# Patient Record
Sex: Female | Born: 1965 | Race: Black or African American | Hispanic: No | Marital: Single | Smoking: Never smoker
Health system: Southern US, Community
[De-identification: ages and names within clinical notes are randomized; demographics above are authoritative.]

## PROBLEM LIST (undated history)

## (undated) DIAGNOSIS — M1990 Unspecified osteoarthritis, unspecified site: Secondary | ICD-10-CM

## (undated) DIAGNOSIS — E039 Hypothyroidism, unspecified: Secondary | ICD-10-CM

## (undated) DIAGNOSIS — R011 Cardiac murmur, unspecified: Secondary | ICD-10-CM

## (undated) DIAGNOSIS — I340 Nonrheumatic mitral (valve) insufficiency: Secondary | ICD-10-CM

## (undated) DIAGNOSIS — I10 Essential (primary) hypertension: Secondary | ICD-10-CM

## (undated) DIAGNOSIS — I4891 Unspecified atrial fibrillation: Secondary | ICD-10-CM

## (undated) DIAGNOSIS — D259 Leiomyoma of uterus, unspecified: Secondary | ICD-10-CM

## (undated) HISTORY — DX: Cardiac murmur, unspecified: R01.1

## (undated) HISTORY — DX: Nonrheumatic mitral (valve) insufficiency: I34.0

## (undated) HISTORY — PX: BREAST BIOPSY: SHX20

## (undated) HISTORY — PX: TUBAL LIGATION: SHX77

---

## 1999-04-26 ENCOUNTER — Other Ambulatory Visit: Admission: RE | Admit: 1999-04-26 | Discharge: 1999-04-26 | Payer: Self-pay | Admitting: Obstetrics

## 2000-09-12 ENCOUNTER — Emergency Department (HOSPITAL_COMMUNITY): Admission: EM | Admit: 2000-09-12 | Discharge: 2000-09-12 | Payer: Self-pay | Admitting: Emergency Medicine

## 2001-05-31 ENCOUNTER — Emergency Department (HOSPITAL_COMMUNITY): Admission: EM | Admit: 2001-05-31 | Discharge: 2001-05-31 | Payer: Self-pay | Admitting: *Deleted

## 2002-01-24 HISTORY — PX: WISDOM TOOTH EXTRACTION: SHX21

## 2002-12-15 ENCOUNTER — Emergency Department (HOSPITAL_COMMUNITY): Admission: EM | Admit: 2002-12-15 | Discharge: 2002-12-15 | Payer: Self-pay | Admitting: Emergency Medicine

## 2002-12-20 ENCOUNTER — Emergency Department (HOSPITAL_COMMUNITY): Admission: EM | Admit: 2002-12-20 | Discharge: 2002-12-21 | Payer: Self-pay | Admitting: Emergency Medicine

## 2003-08-03 ENCOUNTER — Emergency Department (HOSPITAL_COMMUNITY): Admission: EM | Admit: 2003-08-03 | Discharge: 2003-08-03 | Payer: Self-pay | Admitting: Emergency Medicine

## 2003-08-10 ENCOUNTER — Emergency Department (HOSPITAL_COMMUNITY): Admission: EM | Admit: 2003-08-10 | Discharge: 2003-08-10 | Payer: Self-pay

## 2003-08-17 ENCOUNTER — Emergency Department (HOSPITAL_COMMUNITY): Admission: EM | Admit: 2003-08-17 | Discharge: 2003-08-17 | Payer: Self-pay | Admitting: Family Medicine

## 2004-08-11 ENCOUNTER — Emergency Department (HOSPITAL_COMMUNITY): Admission: EM | Admit: 2004-08-11 | Discharge: 2004-08-11 | Payer: Self-pay | Admitting: Emergency Medicine

## 2006-07-21 ENCOUNTER — Emergency Department: Payer: Self-pay | Admitting: Emergency Medicine

## 2006-10-28 ENCOUNTER — Emergency Department (HOSPITAL_COMMUNITY): Admission: EM | Admit: 2006-10-28 | Discharge: 2006-10-28 | Payer: Self-pay | Admitting: Emergency Medicine

## 2006-12-26 ENCOUNTER — Ambulatory Visit (HOSPITAL_COMMUNITY): Admission: RE | Admit: 2006-12-26 | Discharge: 2006-12-26 | Payer: Self-pay | Admitting: Obstetrics

## 2007-02-24 ENCOUNTER — Emergency Department (HOSPITAL_COMMUNITY): Admission: EM | Admit: 2007-02-24 | Discharge: 2007-02-24 | Payer: Self-pay | Admitting: Emergency Medicine

## 2007-03-16 ENCOUNTER — Emergency Department (HOSPITAL_COMMUNITY): Admission: EM | Admit: 2007-03-16 | Discharge: 2007-03-16 | Payer: Self-pay | Admitting: Emergency Medicine

## 2007-05-08 ENCOUNTER — Emergency Department (HOSPITAL_COMMUNITY): Admission: EM | Admit: 2007-05-08 | Discharge: 2007-05-08 | Payer: Self-pay | Admitting: Emergency Medicine

## 2008-06-11 ENCOUNTER — Emergency Department (HOSPITAL_COMMUNITY): Admission: EM | Admit: 2008-06-11 | Discharge: 2008-06-11 | Payer: Self-pay | Admitting: Emergency Medicine

## 2009-04-14 ENCOUNTER — Emergency Department (HOSPITAL_COMMUNITY): Admission: EM | Admit: 2009-04-14 | Discharge: 2009-04-14 | Payer: Self-pay | Admitting: Emergency Medicine

## 2010-10-19 LAB — RAPID STREP SCREEN (MED CTR MEBANE ONLY): Streptococcus, Group A Screen (Direct): NEGATIVE

## 2010-11-04 LAB — CBC
HCT: 32.6 — ABNORMAL LOW
Hemoglobin: 10.8 — ABNORMAL LOW
MCHC: 33.1
MCV: 81.6
Platelets: 259
RBC: 4
RDW: 14.5 — ABNORMAL HIGH
WBC: 7.3

## 2010-11-04 LAB — DIFFERENTIAL
Basophils Absolute: 0
Basophils Relative: 0
Eosinophils Absolute: 0.2
Eosinophils Relative: 2
Lymphocytes Relative: 34
Lymphs Abs: 2.5
Monocytes Absolute: 0.7
Monocytes Relative: 10
Neutro Abs: 3.9
Neutrophils Relative %: 54

## 2010-11-04 LAB — PREGNANCY, URINE: Preg Test, Ur: NEGATIVE

## 2010-11-04 LAB — URINALYSIS, ROUTINE W REFLEX MICROSCOPIC
Bilirubin Urine: NEGATIVE
Glucose, UA: NEGATIVE
Ketones, ur: NEGATIVE
Leukocytes, UA: NEGATIVE
Nitrite: NEGATIVE
Protein, ur: NEGATIVE
Specific Gravity, Urine: 1.016
Urobilinogen, UA: 0.2
pH: 7

## 2010-11-04 LAB — BASIC METABOLIC PANEL
BUN: 11
CO2: 27
Calcium: 8.8
Chloride: 109
Creatinine, Ser: 0.82
GFR calc Af Amer: >60
GFR calc non Af Amer: >60
Glucose, Bld: 104 — ABNORMAL HIGH
Potassium: 3.7
Sodium: 140

## 2010-11-04 LAB — URINE MICROSCOPIC-ADD ON

## 2012-09-25 ENCOUNTER — Other Ambulatory Visit: Payer: Self-pay | Admitting: Obstetrics

## 2012-09-25 ENCOUNTER — Other Ambulatory Visit: Payer: Self-pay | Admitting: Emergency Medicine

## 2012-09-25 DIAGNOSIS — Z1231 Encounter for screening mammogram for malignant neoplasm of breast: Secondary | ICD-10-CM

## 2012-10-11 ENCOUNTER — Ambulatory Visit (HOSPITAL_COMMUNITY)
Admission: RE | Admit: 2012-10-11 | Discharge: 2012-10-11 | Disposition: A | Discharge status: Home / Self Care | Payer: No Typology Code available for payment source | Source: Ambulatory Visit | Attending: Emergency Medicine | Admitting: Emergency Medicine

## 2012-10-11 DIAGNOSIS — Z1231 Encounter for screening mammogram for malignant neoplasm of breast: Secondary | ICD-10-CM | POA: Insufficient documentation

## 2014-11-12 ENCOUNTER — Other Ambulatory Visit: Payer: Self-pay

## 2014-11-12 DIAGNOSIS — Z1231 Encounter for screening mammogram for malignant neoplasm of breast: Secondary | ICD-10-CM

## 2014-11-24 ENCOUNTER — Ambulatory Visit
Admission: RE | Admit: 2014-11-24 | Discharge: 2014-11-24 | Disposition: A | Discharge status: Home / Self Care | Payer: 59 | Source: Ambulatory Visit

## 2014-11-24 DIAGNOSIS — Z1231 Encounter for screening mammogram for malignant neoplasm of breast: Secondary | ICD-10-CM

## 2014-11-25 ENCOUNTER — Ambulatory Visit: Payer: No Typology Code available for payment source

## 2015-11-26 ENCOUNTER — Emergency Department (HOSPITAL_COMMUNITY)
Admission: EM | Admit: 2015-11-26 | Discharge: 2015-11-26 | Disposition: A | Discharge status: Home / Self Care | Payer: Worker's Compensation | Attending: Emergency Medicine | Admitting: Emergency Medicine

## 2015-11-26 ENCOUNTER — Encounter (HOSPITAL_COMMUNITY): Payer: Self-pay | Admitting: Emergency Medicine

## 2015-11-26 DIAGNOSIS — Z7982 Long term (current) use of aspirin: Secondary | ICD-10-CM | POA: Insufficient documentation

## 2015-11-26 DIAGNOSIS — S0990XA Unspecified injury of head, initial encounter: Secondary | ICD-10-CM | POA: Diagnosis not present

## 2015-11-26 DIAGNOSIS — W2203XA Walked into furniture, initial encounter: Secondary | ICD-10-CM | POA: Diagnosis not present

## 2015-11-26 DIAGNOSIS — Y999 Unspecified external cause status: Secondary | ICD-10-CM | POA: Insufficient documentation

## 2015-11-26 DIAGNOSIS — I10 Essential (primary) hypertension: Secondary | ICD-10-CM | POA: Insufficient documentation

## 2015-11-26 DIAGNOSIS — Y929 Unspecified place or not applicable: Secondary | ICD-10-CM | POA: Insufficient documentation

## 2015-11-26 DIAGNOSIS — Y9389 Activity, other specified: Secondary | ICD-10-CM | POA: Diagnosis not present

## 2015-11-26 HISTORY — DX: Essential (primary) hypertension: I10

## 2015-11-26 NOTE — ED Notes (Signed)
[facility] COUPON 194 GIVEN.

## 2015-11-26 NOTE — ED Triage Notes (Addendum)
Patient sent here by [facility] center MD. Hit head on shelf standing up; no nausea, dizziness, vomiting, or LOC. Difficulty to visualize where injury was. Tender to palpation. Pt on blood thinners.

## 2015-11-26 NOTE — ED Provider Notes (Signed)
[place] DEPT Provider Note   CSN: [id number] Arrival date & time: 11/26/15  1055  By signing my name below, I, Evelene Croon, attest that this documentation has been prepared under the direction and in the presence of non-physician practitioner, Montine Circle, PA-C. Electronically Signed: Evelene Croon, Scribe. 11/26/2015. 11:21 AM.    History   Chief Complaint Chief Complaint  Patient presents with  . Head Laceration   The history is provided by the patient. No language interpreter was used.    HPI Comments:  Melissa Riley is a 50 y.o. female who presents to the Emergency Department complaining of a small laceration to the top of her head which she sustained yesterday. She states she stood up and struck her head on a self above her. She denies LOC. Pt cleaned the wound with water and bleeding controlled PTA. Pt reports associated mild HA at this time. She was sent from the [facility] for further evaluation. Pt takes 81mg  ASA daily; no other anti-coagulants. Pt has no other acute complaints or injuries at this time. No visual changes, or vomiting.     Past Medical History:  Diagnosis Date  . Hypertension     There are no active problems to display for this patient.   No past surgical history on file.  OB History    No data available       Home Medications    Prior to Admission medications   Not on File    Family History No family history on file.  Social History Social History  Substance Use Topics  . Smoking status: Not on file  . Smokeless tobacco: Not on file  . Alcohol use Not on file     Allergies   Amoxil [amoxicillin]   Review of Systems Review of Systems  Eyes: Negative for visual disturbance.  Gastrointestinal: Negative for vomiting.  Skin: Positive for wound.  Neurological: Positive for headaches.     Physical Exam Updated Vital Signs BP 125/81 (BP Location: Right Arm)   Pulse 72   Temp 98.3 F (36.8 C) (Oral)    Resp 18   LMP 11/09/2015   SpO2 98%   Physical Exam  Constitutional: She is oriented to person, place, and time. She appears well-developed and well-nourished. No distress.  HENT:  Head: Normocephalic and atraumatic.  No battle's sign No raccoon's eyes Mild tenderness without crepitus or deformity of top of scalp  Eyes: Conjunctivae are normal.  Cardiovascular: Normal rate.   Pulmonary/Chest: Effort normal.  Abdominal: She exhibits no distension.  Neurological: She is alert and oriented to person, place, and time.  Skin: Skin is warm and dry.  Very minor wound to top of scalp, no laceration, no current bleeding  Psychiatric: She has a normal mood and affect.  Nursing note and vitals reviewed.    ED Treatments / Results  DIAGNOSTIC STUDIES:  Oxygen Saturation is 98% on RA, normal by my interpretation.    COORDINATION OF CARE:  11:20 AM Discussed treatment plan with pt at bedside and pt agreed to plan.  Labs (all labs ordered are listed, but only abnormal results are displayed) Labs Reviewed - No data to display  EKG  EKG Interpretation None       Radiology No results found.  Procedures Procedures (including critical care time)  Medications Ordered in ED Medications - No data to display   Initial Impression / Assessment and Plan / ED Course  I have reviewed the triage vital signs and the nursing  notes.  Pertinent labs & imaging results that were available during my care of the patient were reviewed by me and considered in my medical decision making (see chart for details).  Clinical Course      Patient symptoms consistent with mild head injury. No bleeding now.  Incident occurred yesterday.  Patient takes a baby aspirin only.  Is very well appearing.  No vomiting. No focal neurological deficits on physical exam.  CT is not indicated at this time. Discussed return precautions including any new severe headaches, disequilibrium, vomiting, double vision,  extremity weakness, difficulty ambulating, or any other concerning symptoms. Patient will be discharged with information pertaining to diagnosis. Pt is safe for discharge at this time.  Discussed with Dr. Darl Householder, who agrees with the plan.  Final Clinical Impressions(s) / ED Diagnoses   Final diagnoses:  Minor head injury, initial encounter    New Prescriptions New Prescriptions   No medications on file    I personally performed the services described in this documentation, which was scribed in my presence. The recorded information has been reviewed and is accurate.       Montine Circle, PA-C 11/26/15 [place], MD 11/26/15 1524

## 2015-11-26 NOTE — ED Notes (Signed)
UNABLE TO DISCHARGE IN COMPUTER AT THIS TIME.

## 2015-11-28 IMAGING — MG MM SCREEN MAMMOGRAM BILATERAL
5 series · 5 of 5 positions shown · non-contrast
Comparison: Previous exam(s).

CLINICAL DATA: Screening.

EXAM:
DIGITAL SCREENING BILATERAL MAMMOGRAM WITH CAD

[R CC]
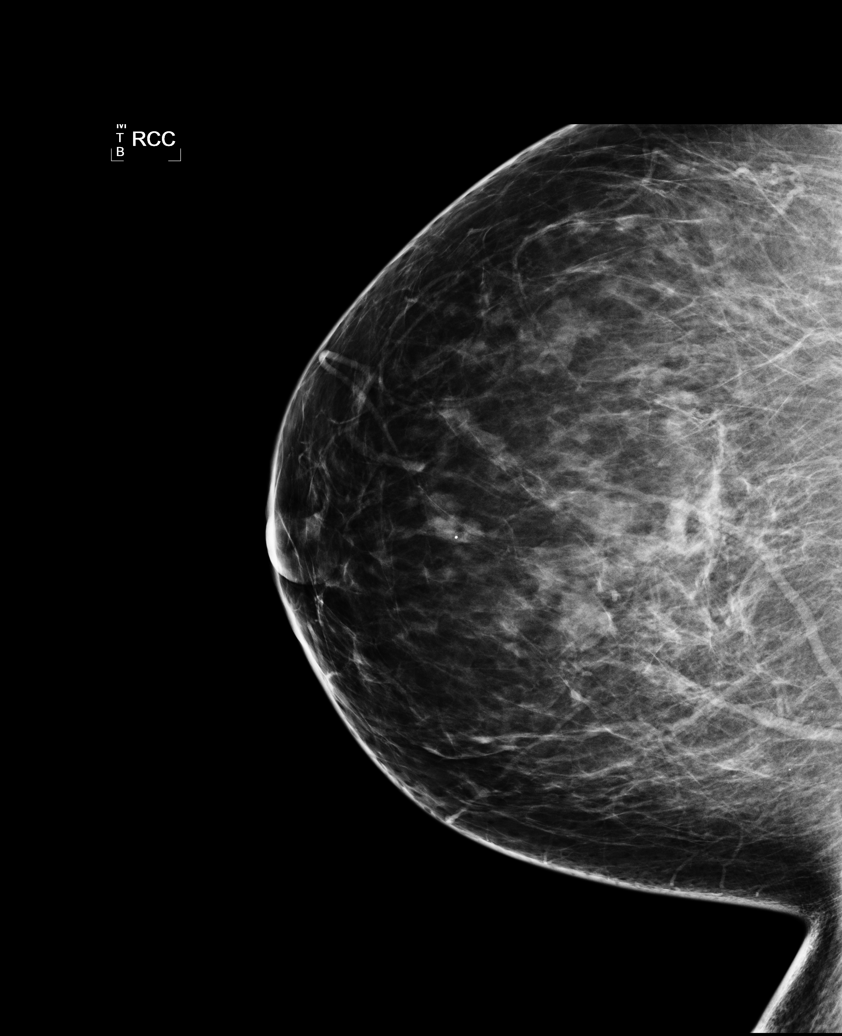

[L CC]
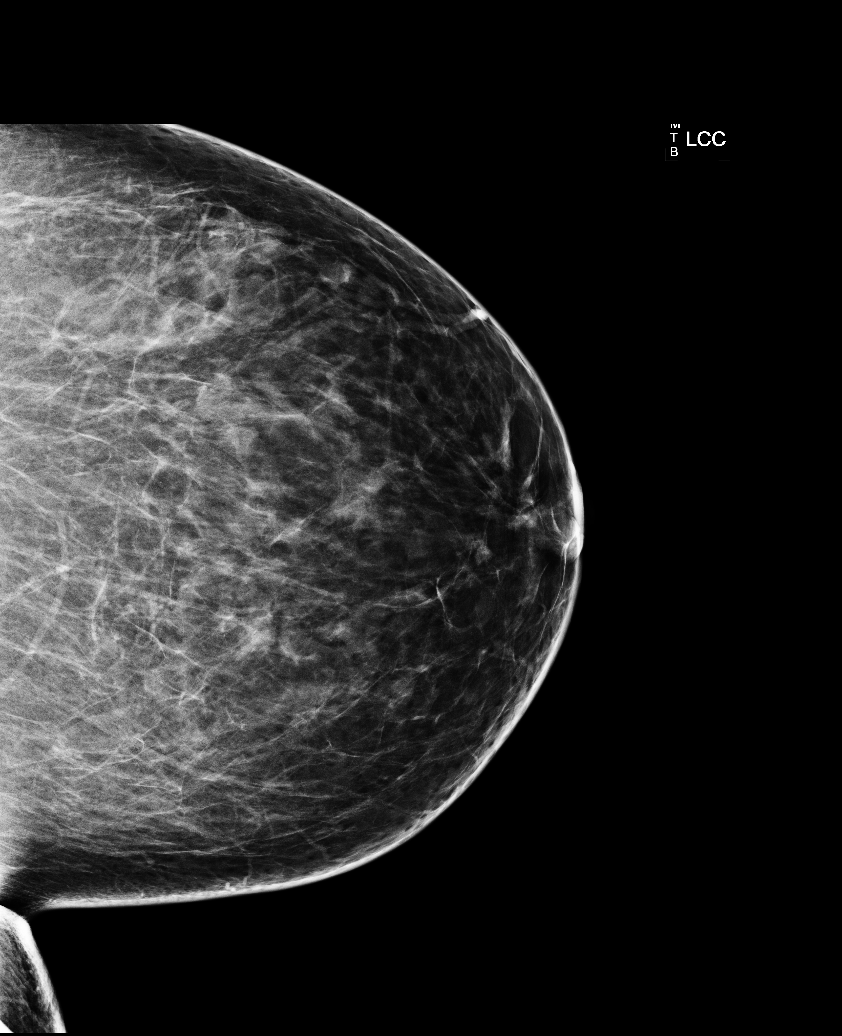

[L MLO]
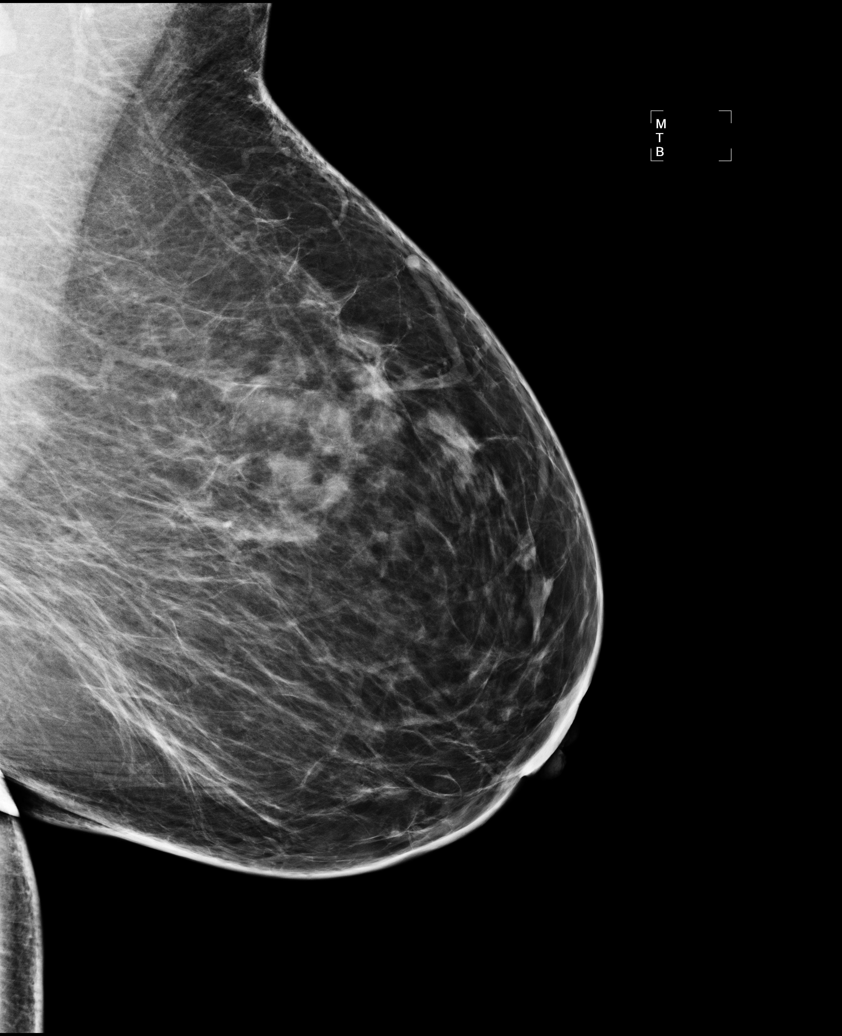

[R MLO (1 of 2)]
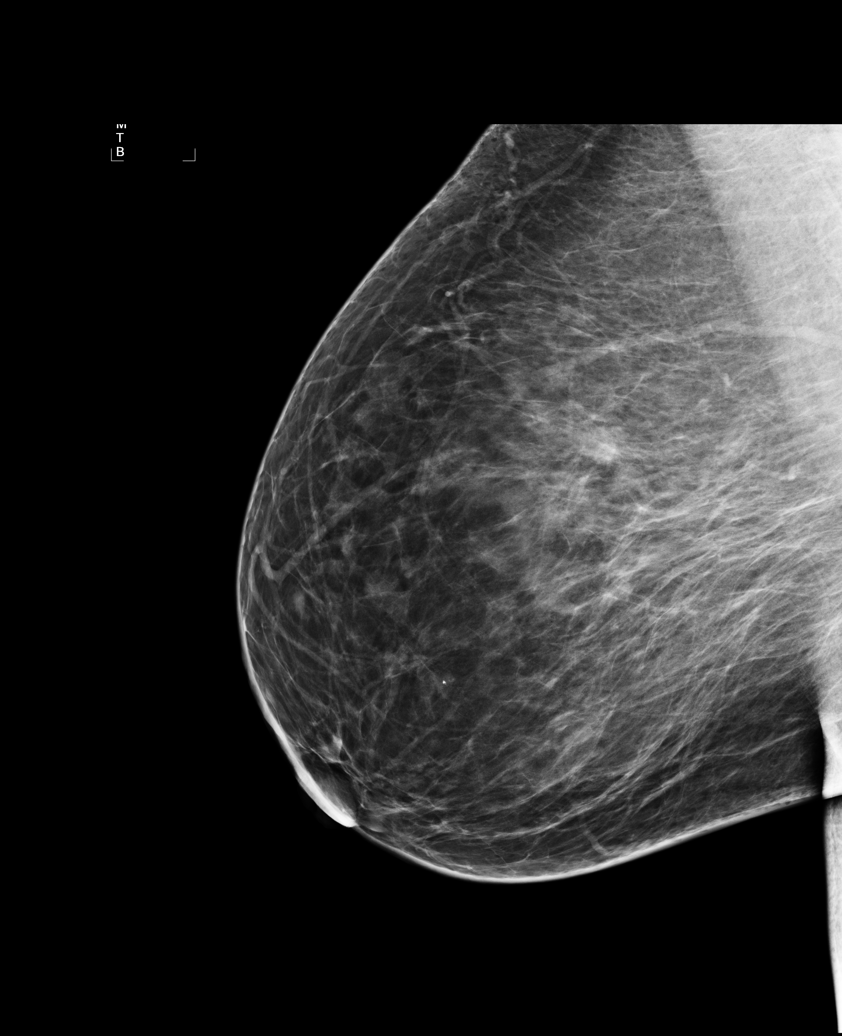

[R MLO (2 of 2)]
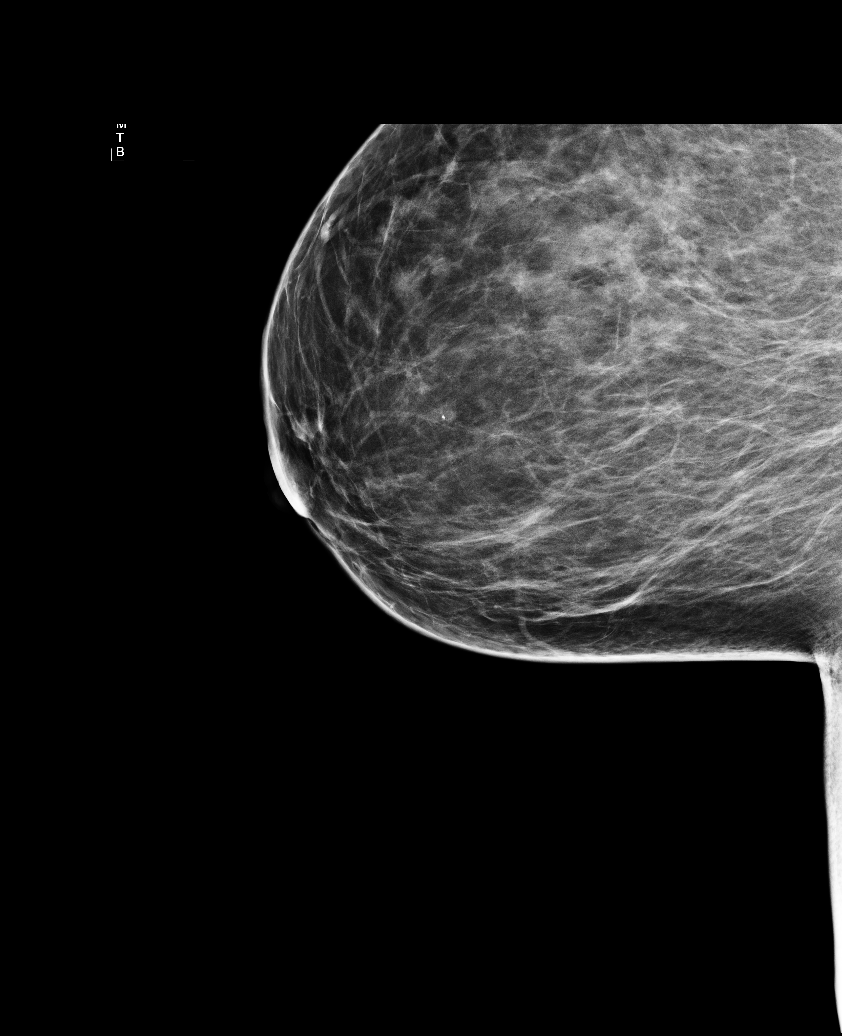

[5 of 5 positions shown; findings below may reference images not displayed]

ACR Breast Density Category b: There are scattered areas of
fibroglandular density.
FINDINGS: There are no findings suspicious for malignancy. Images were
processed with CAD.
IMPRESSION: No mammographic evidence of malignancy. A result letter of this
screening mammogram will be mailed directly to the patient.

RECOMMENDATION:
Screening mammogram in one year. (Code:AS-G-LCT)

BI-RADS CATEGORY  1: Negative.

## 2016-11-18 DIAGNOSIS — R011 Cardiac murmur, unspecified: Secondary | ICD-10-CM | POA: Insufficient documentation

## 2016-11-18 DIAGNOSIS — E559 Vitamin D deficiency, unspecified: Secondary | ICD-10-CM | POA: Insufficient documentation

## 2017-02-09 DIAGNOSIS — I340 Nonrheumatic mitral (valve) insufficiency: Secondary | ICD-10-CM | POA: Diagnosis present

## 2017-02-09 NOTE — H&P (Signed)
OFFICE VISIT NOTES COPIED TO EPIC FOR DOCUMENTATION  . History of Present Illness Melissa Maine FNP-C; 01/27/2017 6:05 PM) Patient words: Last OV 01/06/2017; FU for echo.  The patient is a 52 year old female who presents for the evaluation and management of heart murmur. Melissa Riley is a plesant 52 year old AA female that was initally referred to [place] for further evaluation of heart murmur and carotid bruit. She has minimal dyspnea on exertion that she does mostly notice with climbing stairs. Denies chest pain, PND, orthopnea, palpitations, edema, syncopal episodes, or symptoms of claudication or TIA. She recently underwent echocardiogram on 01/19/2017 that showed moderate TR and moderate to severe MR and MVP with LV dilation. She now presents to discuss these results.  Patient reports that she has been working to lose weight with making diet changes and has lost 30 lbs total over the last few months.   Problem List/Past Medical Melissa Riley; 01/27/2017 3:08 PM) Laboratory examination (Z01.89)  Hypertension, benign (I10)  EKG 01/06/2017: Sinus rhythm 64 bpm. Normal axis. Normal conduction. Old inferior infarct. Murmur (R01.1)  Echocardiogram 01/19/2017: Left ventricle cavity is mildly dilated. LVESD 4.1 cm. Mild concentric hypertrophy of the left ventricle. Normal global wall motion. Calculated EF 60%. Left atrial cavity is severely dilated. 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 Estimated pulmonary artery systolic pressure 35 mmHg. Mild pulmonic regurgitation. Consider TEE, if clinically indicated, for further evaluation of mitral and tricuspid regurgitation.  Allergies Melissa Riley; 01/27/2017 3:08 PM) Amoxicillin *PENICILLINS*  yeast infection  Family History Melissa Riley; 01/27/2017 3:08 PM) Mother  In stable health. open heart surgery in  mid 86s age, DM, htn, heart attack, no strokes Father  In stable health. unsure of health conditions Sister 1  younger-stable Brother 2  younger-stable  Social History Melissa Riley; 01/27/2017 3:08 PM) Current tobacco use  Never smoker. Non Drinker/No Alcohol Use  Marital status  Single. Living Situation  Lives with relatives. daughterand grandchildren live with patient Number of Children  2.  Past Surgical History Melissa Riley; 01/27/2017 3:08 PM) Lumpectomy [1998]: Tubal Ligation [1995]:  Medication History Melissa Riley; 01/27/2017 3:12 PM) AmLODIPine Besylate (5MG Tablet, 1 Oral daily) Active. Lisinopril-Hydrochlorothiazide (20-25MG Tablet, 1 Oral daily) Active. Bayer Aspirin EC Low Dose (81MG Tablet DR, 1 Oral daily) Active. Multivitamin Adults (2 Oral daily) Active. Medications Reconciled (verbally)  Diagnostic Studies History Melissa Riley; 01/27/2017 9:18 AM) Echocardiogram [01/19/2017]: Left ventricle cavity is mildly dilated. LVESD 4.1 cm. Mild concentric hypertrophy of the left ventricle. Normal global wall motion. Calculated EF 60%. Left atrial cavity is severely dilated. 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 Estimated pulmonary artery systolic pressure 35 mmHg. Mild pulmonic regurgitation. Consider TEE, if clinically indicated, for further evaluation of mitral and tricuspid regurgitation.    Review of Systems Melissa Maine FNP-C; 01/27/2017 6:01 PM) General Not Present- Appetite Loss and Weight Gain. Respiratory Not Present- Chronic Cough and Wakes up from Sleep Wheezing or Short of Breath. Cardiovascular Present- Difficulty Breathing On Exertion (mild, mostly with climbing stairs). Not Present- Chest Pain, Claudications, Difficulty Breathing Lying Down and Edema. Gastrointestinal Not Present-  Black, Tarry Stool and Difficulty Swallowing. Musculoskeletal Not Present- Decreased Range of Motion and Muscle Atrophy. Neurological Not Present- Attention Deficit. Psychiatric Not Present- Personality Changes and Suicidal Ideation. Endocrine Not Present- Cold Intolerance and  Heat Intolerance. Hematology Not Present- Abnormal Bleeding. All other systems negative  Vitals Melissa Riley; 01/27/2017 3:14 PM) 01/27/2017 3:11 PM Weight: 209.44 lb Height: 62.5in Body Surface Area: 1.96 m Body Mass Index: 37.7 kg/m  Pulse: 75 (Regular)  P.OX: 98% (Room air) BP: 110/70 (Sitting, Left Arm, Standard)       Physical Exam Melissa Maine FNP-C; 01/27/2017 6:06 PM) General Mental Status-Alert. General Appearance-Cooperative and Appears stated age. Build & Nutrition-Moderately built.  Head and Neck Thyroid Gland Characteristics - normal size and consistency and no palpable nodules.  Chest and Lung Exam Chest and lung exam reveals -quiet, even and easy respiratory effort with no use of accessory muscles, non-tender and on auscultation, normal breath sounds, no adventitious sounds.  Cardiovascular Cardiovascular examination reveals -carotid auscultation reveals no bruits, abdominal aorta auscultation reveals no bruits and no prominent pulsation, femoral artery auscultation bilaterally reveals normal pulses, no bruits, no thrills, normal pedal pulses bilaterally and no digital clubbing, cyanosis, edema, increased warmth or tenderness. Auscultation Murmurs & Other Heart Sounds - Murmur - Location - Apex. Timing - Holosystolic. Grade - III/VI.  Abdomen Palpation/Percussion Palpation and Percussion of the abdomen reveal - Non Tender and No hepatosplenomegaly.  Neurologic Neurologic evaluation reveals -alert and oriented x 3 with no impairment of recent or remote memory. Motor-Grossly intact without any focal deficits.  Musculoskeletal Global Assessment Left Lower  Extremity - no deformities, masses or tenderness, no known fractures. Right Lower Extremity - no deformities, masses or tenderness, no known fractures.   Results Melissa Maine FNP-C; 01/27/2017 6:00 PM) Procedures  Name Value Date Echocardiography, transthoracic, real-time with image documentation (2D), includes M-mode recording, when performed, complete, with spectral Doppler echocardiography, and with color flow Doppler echocardiography (11941) Comments: Echocardiogram 01/19/2017: Left ventricle cavity is mildly dilated. LVESD 4.1 cm. Mild concentric hypertrophy of the left ventricle. Normal global wall motion. Calculated EF 60%. Left atrial cavity is severely dilated. 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 Estimated pulmonary artery systolic pressure 35 mmHg. Mild pulmonic regurgitation. Consider TEE, if clinically indicated, for further evaluation of mitral and tricuspid regurgitation.  Performed: 01/19/2017 9:54 AM    Assessment & Plan Melissa Maine FNP-C; 01/27/2017 6:00 PM) Dyspnea on exertion. Moderate to severe mitral regurgitation (I34.0) Story: Echocardiogram 01/19/2017: Left ventricle cavity is mildly dilated. LVESD 4.1 cm. Mild concentric hypertrophy of the left ventricle. Normal global wall motion. Calculated EF 60%. Left atrial cavity is severely dilated. 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 Estimated pulmonary artery systolic pressure 35 mmHg. Mild pulmonic regurgitation. Consider TEE, if clinically indicated, for further evaluation of mitral and tricuspid regurgitation. Hypertension, benign (I10) Story: EKG 01/06/2017:  Sinus rhythm 64 bpm. Normal axis. Normal  conduction. Old inferior infarct. Obesity (BMI 30-39.9) (E66.9) Laboratory examination (Z01.89) Story: Labs 11/23/2016:  H/H 11.5/33.6. MCV 87. 8 history 126. BUN/creatinine 16/08. EGFR 80/92. Sodium 139, potassium 4.7. TSH 4.9 mildly elevated  Note:. Recommendation:  Test results were discussed with the patient. Given her dyspnea on exertion and LV strain noted on echo from MR would recommend further evaluation with TEE. Schedule for TEE to better evaluate the structural abnormality. I have explained risks, benefits,alternatives to TEE, including but not limited to rare esophageal perforation, bleeding, aspiration pneumonia. Patient verbalizes understanding and agrees with plan. She has been working to lose weight and  states that she has lost 30 lbs over the last few months with diet. I have congratulated her on her efforts and provided positive reinforcement. Hypertension has remained stable. We will see her back after her procedure for further recommendations.  CC: Everardo Beals, NP  Signed by Melissa Maine, FNP-C (01/27/2017 6:07 PM)

## 2017-02-10 ENCOUNTER — Other Ambulatory Visit: Payer: Self-pay

## 2017-02-10 ENCOUNTER — Ambulatory Visit (HOSPITAL_COMMUNITY)
Admission: RE | Admit: 2017-02-10 | Discharge: 2017-02-10 | Disposition: A | Discharge status: Home / Self Care | Payer: 59 | Source: Ambulatory Visit | Attending: Cardiology | Admitting: Cardiology

## 2017-02-10 ENCOUNTER — Ambulatory Visit (HOSPITAL_COMMUNITY): Payer: 59

## 2017-02-10 ENCOUNTER — Encounter (HOSPITAL_COMMUNITY): Payer: Self-pay

## 2017-02-10 ENCOUNTER — Encounter (HOSPITAL_COMMUNITY)
Admission: RE | Disposition: A | Discharge status: Home / Self Care | Payer: Self-pay | Source: Ambulatory Visit | Attending: Cardiology

## 2017-02-10 DIAGNOSIS — Z7982 Long term (current) use of aspirin: Secondary | ICD-10-CM | POA: Insufficient documentation

## 2017-02-10 DIAGNOSIS — I081 Rheumatic disorders of both mitral and tricuspid valves: Secondary | ICD-10-CM | POA: Insufficient documentation

## 2017-02-10 DIAGNOSIS — R011 Cardiac murmur, unspecified: Secondary | ICD-10-CM | POA: Diagnosis present

## 2017-02-10 DIAGNOSIS — Z79899 Other long term (current) drug therapy: Secondary | ICD-10-CM | POA: Diagnosis not present

## 2017-02-10 DIAGNOSIS — I10 Essential (primary) hypertension: Secondary | ICD-10-CM | POA: Insufficient documentation

## 2017-02-10 DIAGNOSIS — E669 Obesity, unspecified: Secondary | ICD-10-CM | POA: Diagnosis not present

## 2017-02-10 DIAGNOSIS — I252 Old myocardial infarction: Secondary | ICD-10-CM | POA: Diagnosis not present

## 2017-02-10 DIAGNOSIS — Z6837 Body mass index (BMI) 37.0-37.9, adult: Secondary | ICD-10-CM | POA: Diagnosis not present

## 2017-02-10 DIAGNOSIS — I340 Nonrheumatic mitral (valve) insufficiency: Secondary | ICD-10-CM | POA: Diagnosis present

## 2017-02-10 HISTORY — PX: TEE WITHOUT CARDIOVERSION: SHX5443

## 2017-02-10 SURGERY — ECHOCARDIOGRAM, TRANSESOPHAGEAL
Anesthesia: Moderate Sedation

## 2017-02-10 MED ORDER — FENTANYL CITRATE (PF) 100 MCG/2ML IJ SOLN
INTRAMUSCULAR | Status: AC
  Filled 2017-02-10: qty 2

## 2017-02-10 MED ORDER — BUTAMBEN-TETRACAINE-BENZOCAINE 2-2-14 % EX AERO
INHALATION_SPRAY | CUTANEOUS | Status: DC | PRN
  Administered 2017-02-10: 2 via TOPICAL

## 2017-02-10 MED ORDER — MIDAZOLAM HCL 5 MG/ML IJ SOLN
INTRAMUSCULAR | Status: AC
  Filled 2017-02-10: qty 2

## 2017-02-10 MED ORDER — FENTANYL CITRATE (PF) 100 MCG/2ML IJ SOLN
INTRAMUSCULAR | Status: DC | PRN
  Administered 2017-02-10: 25 ug via INTRAVENOUS

## 2017-02-10 MED ORDER — MIDAZOLAM HCL 10 MG/2ML IJ SOLN
INTRAMUSCULAR | Status: DC | PRN
  Administered 2017-02-10: 2 mg via INTRAVENOUS

## 2017-02-10 MED ORDER — SODIUM CHLORIDE 0.9 % IV SOLN: INTRAVENOUS | Status: DC

## 2017-02-10 NOTE — Progress Notes (Signed)
  Echocardiogram Echocardiogram Transesophageal has been performed.  Jannett Celestine 02/10/2017, 11:05 AM

## 2017-02-10 NOTE — CV Procedure (Signed)
TEE: Under moderate sedation, TEE was performed without complications: LV: Mildly dilated at 5.8 cm.  Normal EF. RV: Normal LA: Normal. Left atrial appendage: Normal without thrombus. Normal function. Inter atrial septum is intact without defect. Double contrast study negative for atrial level shunting. No late appearance of bubbles either. 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 TV: Normal Mild TR, Moderate pulmonary hypertension with peak RV systolic pressure of 42-39 mm Hg. AV: Normal. No AI or AS. PV: Normal. Trace PI.  Thoracic and ascending aorta: Normal without significant plaque or atheromatous changes.  Conscious sedation protocol was followed, I personally administered conscious sedation and monitored the patient. Patient received 2 milligrams of Versed and 43mcg fentanyl  . Patient tolerated the procedure well and there was no complication from conscious sedation. Time administered was 15 minutes.

## 2017-02-10 NOTE — Interval H&P Note (Signed)
**Note Melissa-Identified via Obfuscation** History and Physical Interval Note:  02/10/2017 10:17 AM  Melissa Riley  has presented today for surgery, with the diagnosis of MITRAL REGURGITATION  The various methods of treatment have been discussed with the patient and family. After consideration of risks, benefits and other options for treatment, the patient has consented to  Procedure(s): TRANSESOPHAGEAL ECHOCARDIOGRAM (TEE) (N/A) as a surgical intervention .  The patient's history has been reviewed, patient examined, no change in status, stable for surgery.  I have reviewed the patient's chart and labs.  Questions were answered to the patient's satisfaction.     Adrian Prows

## 2017-02-10 NOTE — Discharge Instructions (Signed)

## 2017-02-13 ENCOUNTER — Encounter (HOSPITAL_COMMUNITY): Payer: Self-pay | Admitting: Cardiology

## 2019-03-30 ENCOUNTER — Ambulatory Visit: Payer: Medicaid Other | Attending: Internal Medicine

## 2019-03-30 DIAGNOSIS — Z23 Encounter for immunization: Secondary | ICD-10-CM

## 2019-03-30 NOTE — Progress Notes (Signed)
   Covid-19 Vaccination Clinic  Name:  Melissa Riley    MRN: [id number] DOB: 16-May-1965  03/30/2019  [name] was observed post Covid-19 immunization for 15 minutes without incident. She was provided with Vaccine Information Sheet and instruction to access the V-Safe system.   [name] was instructed to call 911 with any severe reactions post vaccine: Marland Kitchen Difficulty breathing  . Swelling of face and throat  . A fast heartbeat  . A bad rash all over body  . Dizziness and weakness   Immunizations Administered    Name Date Dose VIS Date Route   Pfizer COVID-19 Vaccine 03/30/2019 10:51 AM 0.3 mL 01/04/2019 Intramuscular   Manufacturer: [place]   Lot: [id number]   [place]: [id number]

## 2019-04-20 ENCOUNTER — Ambulatory Visit: Payer: Medicaid Other | Attending: Internal Medicine

## 2019-04-20 DIAGNOSIS — Z23 Encounter for immunization: Secondary | ICD-10-CM

## 2019-04-20 NOTE — Progress Notes (Signed)
   Covid-19 Vaccination Clinic  Name:  GURKIRAN NEWITT    MRN: [id number] DOB: January 18, 1966  04/20/2019  Ms. Louk was observed post Covid-19 immunization for 15 minutes without incident. She was provided with Vaccine Information Sheet and instruction to access the V-Safe system.   Ms. Laidley was instructed to call 911 with any severe reactions post vaccine: Marland Kitchen Difficulty breathing  . Swelling of face and throat  . A fast heartbeat  . A bad rash all over body  . Dizziness and weakness   Immunizations Administered    Name Date Dose VIS Date Route   Pfizer COVID-19 Vaccine 04/20/2019 10:44 AM 0.3 mL 01/04/2019 Intramuscular   Manufacturer: [place]   Lot: [id number]   [place]: [id number]

## 2019-05-29 ENCOUNTER — Other Ambulatory Visit: Payer: Self-pay | Admitting: Internal Medicine

## 2019-05-29 DIAGNOSIS — Z1231 Encounter for screening mammogram for malignant neoplasm of breast: Secondary | ICD-10-CM

## 2019-07-10 ENCOUNTER — Other Ambulatory Visit: Payer: Self-pay

## 2019-07-10 DIAGNOSIS — Z1231 Encounter for screening mammogram for malignant neoplasm of breast: Secondary | ICD-10-CM

## 2019-08-01 ENCOUNTER — Ambulatory Visit
Admission: RE | Admit: 2019-08-01 | Discharge: 2019-08-01 | Disposition: A | Discharge status: Home / Self Care | Payer: No Typology Code available for payment source | Source: Ambulatory Visit | Attending: Family Medicine | Admitting: Family Medicine

## 2019-08-01 ENCOUNTER — Other Ambulatory Visit: Payer: Self-pay

## 2019-08-01 DIAGNOSIS — Z1231 Encounter for screening mammogram for malignant neoplasm of breast: Secondary | ICD-10-CM

## 2021-01-13 ENCOUNTER — Emergency Department (HOSPITAL_COMMUNITY)
Admission: EM | Admit: 2021-01-13 | Discharge: 2021-01-13 | Disposition: A | Discharge status: Home / Self Care | Payer: No Typology Code available for payment source | Attending: Emergency Medicine | Admitting: Emergency Medicine

## 2021-01-13 ENCOUNTER — Other Ambulatory Visit: Payer: Self-pay

## 2021-01-13 ENCOUNTER — Emergency Department (HOSPITAL_COMMUNITY): Payer: No Typology Code available for payment source

## 2021-01-13 DIAGNOSIS — N9489 Other specified conditions associated with female genital organs and menstrual cycle: Secondary | ICD-10-CM | POA: Insufficient documentation

## 2021-01-13 DIAGNOSIS — N939 Abnormal uterine and vaginal bleeding, unspecified: Secondary | ICD-10-CM | POA: Insufficient documentation

## 2021-01-13 DIAGNOSIS — R5383 Other fatigue: Secondary | ICD-10-CM | POA: Insufficient documentation

## 2021-01-13 DIAGNOSIS — Z79899 Other long term (current) drug therapy: Secondary | ICD-10-CM | POA: Insufficient documentation

## 2021-01-13 DIAGNOSIS — R102 Pelvic and perineal pain: Secondary | ICD-10-CM | POA: Insufficient documentation

## 2021-01-13 DIAGNOSIS — I10 Essential (primary) hypertension: Secondary | ICD-10-CM | POA: Insufficient documentation

## 2021-01-13 DIAGNOSIS — Z7982 Long term (current) use of aspirin: Secondary | ICD-10-CM | POA: Insufficient documentation

## 2021-01-13 DIAGNOSIS — R000 Tachycardia, unspecified: Secondary | ICD-10-CM | POA: Insufficient documentation

## 2021-01-13 LAB — URINALYSIS, ROUTINE W REFLEX MICROSCOPIC

## 2021-01-13 LAB — CBC
HCT: 30.8 % — ABNORMAL LOW (ref 36.0–46.0)
Hemoglobin: 9.5 g/dL — ABNORMAL LOW (ref 12.0–15.0)
MCH: 32.2 pg (ref 26.0–34.0)
MCHC: 30.8 g/dL (ref 30.0–36.0)
MCV: 104.4 fL — ABNORMAL HIGH (ref 80.0–100.0)
Platelets: 304 10*3/uL (ref 150–400)
RBC: 2.95 MIL/uL — ABNORMAL LOW (ref 3.87–5.11)
RDW: 15.9 % — ABNORMAL HIGH (ref 11.5–15.5)
WBC: 8.8 10*3/uL (ref 4.0–10.5)
nRBC: 0.3 % — ABNORMAL HIGH (ref 0.0–0.2)

## 2021-01-13 LAB — COMPREHENSIVE METABOLIC PANEL
ALT: 14 U/L (ref 0–44)
AST: 21 U/L (ref 15–41)
Albumin: 3.9 g/dL (ref 3.5–5.0)
Alkaline Phosphatase: 41 U/L (ref 38–126)
Anion gap: 7 (ref 5–15)
BUN: 10 mg/dL (ref 6–20)
CO2: 21 mmol/L — ABNORMAL LOW (ref 22–32)
Calcium: 8.6 mg/dL — ABNORMAL LOW (ref 8.9–10.3)
Chloride: 107 mmol/L (ref 98–111)
Creatinine, Ser: 0.74 mg/dL (ref 0.44–1.00)
GFR, Estimated: >60 mL/min (ref >60)
Glucose, Bld: 104 mg/dL — ABNORMAL HIGH (ref 70–99)
Potassium: 3.8 mmol/L (ref 3.5–5.1)
Sodium: 135 mmol/L (ref 135–145)
Total Bilirubin: 0.7 mg/dL (ref 0.3–1.2)
Total Protein: 6.6 g/dL (ref 6.5–8.1)

## 2021-01-13 LAB — I-STAT BETA HCG BLOOD, ED (MC, WL, AP ONLY): I-stat hCG, quantitative: <5 m[IU]/mL (ref <5)

## 2021-01-13 LAB — URINALYSIS, MICROSCOPIC (REFLEX): RBC / HPF: >50 RBC/hpf (ref 0–5)

## 2021-01-13 MED ORDER — MEGESTROL ACETATE 40 MG PO TABS: 40.0000 mg | ORAL_TABLET | Freq: Every day | ORAL | 0 refills | Status: DC

## 2021-01-13 NOTE — ED Notes (Signed)
Pt called 3x. No answer

## 2021-01-13 NOTE — ED Provider Notes (Signed)
[facility] EMERGENCY DEPARTMENT Provider Note   CSN: [id number] Arrival date & time: 01/13/21  1126     History Chief Complaint  Patient presents with   Vaginal Bleeding    Melissa Riley is a 55 y.o. female perimenopausal with history of HTN presents to the ED for evaluation of vaginal bleeding that has been ongoing for two months and failed two courses of Provera. Pt completed first course in October and her second course four days ago. She states that bleeding only ceased for maybe one day last month before returning. Now bleeding is heavier and she is soaking through approx 1 pad every 2 hours. She also endorses worsening fatigue and exhaustion. She denies vaginal pain, vaginal discharge, urinary symptoms, abdominopelvic pain, nausea, vomiting and diarrhea.    Vaginal Bleeding Associated symptoms: fatigue   Associated symptoms: no abdominal pain and no fever       Past Medical History:  Diagnosis Date   Hypertension     Patient Active Problem List   Diagnosis Date Noted   Mitral regurgitation 02/09/2017    Past Surgical History:  Procedure Laterality Date   TEE WITHOUT CARDIOVERSION N/A 02/10/2017   Procedure: TRANSESOPHAGEAL ECHOCARDIOGRAM (TEE);  Surgeon: Adrian Prows, MD;  Location: [facility] ENDOSCOPY;  Service: Cardiovascular;  Laterality: N/A;     OB History   No obstetric history on file.     No family history on file.  Social History   Tobacco Use   Smoking status: Never   Smokeless tobacco: Never  Substance Use Topics   Alcohol use: No   Drug use: No    Home Medications Prior to Admission medications   Medication Sig Start Date End Date Taking? Authorizing Provider  amLODipine (NORVASC) 5 MG tablet Take 5 mg by mouth daily.    [provider]  aspirin 81 MG chewable tablet Chew 81 mg by mouth daily.    [provider]  hydrochlorothiazide (HYDRODIURIL) 25 MG tablet Take 25 mg by mouth daily.    [provider]  ibuprofen (ADVIL) 800 MG tablet Take 800 mg by mouth 3 (three) times daily. 12/29/20   [provider]  lisinopril (ZESTRIL) 20 MG tablet Take 20 mg by mouth daily. 11/12/20   [provider]  medroxyPROGESTERone (PROVERA) 10 MG tablet Take 10 mg by mouth daily. 01/05/21   [provider]  metroNIDAZOLE (FLAGYL) 500 MG tablet Take 500 mg by mouth 2 (two) times daily. 01/04/21   [provider]  Multiple Vitamins-Minerals (MULTIVITAMIN WITH MINERALS) tablet Take 1 tablet by mouth daily.    [provider]    Allergies    Amoxil [amoxicillin]  Review of Systems   Review of Systems  Constitutional:  Positive for fatigue. Negative for fever.  HENT: Negative.    Eyes: Negative.   Respiratory:  Negative for shortness of breath.   Cardiovascular: Negative.   Gastrointestinal:  Negative for abdominal pain and vomiting.  Endocrine: Negative.   Genitourinary:  Positive for vaginal bleeding.  Musculoskeletal: Negative.   Skin:  Negative for rash.  Neurological:  Negative for headaches.  All other systems reviewed and are negative.  Physical Exam Updated Vital Signs BP (!) 143/79 (BP Location: Left Arm)    Pulse 82    Temp 98.6 F (37 C) (Oral)    Resp 18    SpO2 100%   Physical Exam Vitals and nursing note reviewed.  Constitutional:      General: She is not in  acute distress.    Appearance: She is not ill-appearing.  HENT:     Head: Atraumatic.  Eyes:     Conjunctiva/sclera: Conjunctivae normal.  Cardiovascular:     Rate and Rhythm: Regular rhythm. Tachycardia present.     Pulses: Normal pulses.     Heart sounds: No murmur heard. Pulmonary:     Effort: Pulmonary effort is normal. No respiratory distress.     Breath sounds: Normal breath sounds.  Abdominal:     General: Abdomen is flat. There is no distension.     Palpations: Abdomen is soft.     Tenderness: There is no abdominal tenderness.  Musculoskeletal:         General: Normal range of motion.     Cervical back: Normal range of motion.  Skin:    General: Skin is warm and dry.     Capillary Refill: Capillary refill takes less than 2 seconds.  Neurological:     General: No focal deficit present.     Mental Status: She is alert.  Psychiatric:        Mood and Affect: Mood normal.    ED Results / Procedures / Treatments   Labs (all labs ordered are listed, but only abnormal results are displayed) Labs Reviewed  COMPREHENSIVE METABOLIC PANEL - Abnormal; Notable for the following components:      Result Value   CO2 21 (*)    Glucose, Bld 104 (*)    Calcium 8.6 (*)    All other components within normal limits  CBC - Abnormal; Notable for the following components:   RBC 2.95 (*)    Hemoglobin 9.5 (*)    HCT 30.8 (*)    MCV 104.4 (*)    RDW 15.9 (*)    nRBC 0.3 (*)    All other components within normal limits  URINALYSIS, ROUTINE W REFLEX MICROSCOPIC - Abnormal; Notable for the following components:   Color, Urine RED (*)    APPearance TURBID (*)    Glucose, UA   (*)    Value: TEST NOT REPORTED DUE TO COLOR INTERFERENCE OF URINE PIGMENT   Hgb urine dipstick   (*)    Value: TEST NOT REPORTED DUE TO COLOR INTERFERENCE OF URINE PIGMENT   Bilirubin Urine   (*)    Value: TEST NOT REPORTED DUE TO COLOR INTERFERENCE OF URINE PIGMENT   Ketones, ur   (*)    Value: TEST NOT REPORTED DUE TO COLOR INTERFERENCE OF URINE PIGMENT   Protein, ur   (*)    Value: TEST NOT REPORTED DUE TO COLOR INTERFERENCE OF URINE PIGMENT   Nitrite   (*)    Value: TEST NOT REPORTED DUE TO COLOR INTERFERENCE OF URINE PIGMENT   Leukocytes,Ua   (*)    Value: TEST NOT REPORTED DUE TO COLOR INTERFERENCE OF URINE PIGMENT   All other components within normal limits  URINALYSIS, MICROSCOPIC (REFLEX) - Abnormal; Notable for the following components:   Bacteria, UA FEW (*)    All other components within normal limits  I-STAT BETA HCG BLOOD, ED ([facility], [facility], [facility] ONLY)     EKG None  Radiology US PELVIC COMPLETE WITH TRANSVAGINAL  Result Date: 01/13/2021 CLINICAL DATA:  A 55 year old female presents with vaginal bleeding since October, potentially perimenopausal, reportedly with irregular cycle and bleeding. EXAM: TRANSABDOMINAL AND TRANSVAGINAL ULTRASOUND OF PELVIS TECHNIQUE: Both transabdominal and transvaginal ultrasound examinations of the pelvis were performed. Transabdominal technique was performed for global imaging of the pelvis including uterus,  ovaries, adnexal regions, and pelvic cul-de-sac. It was necessary to proceed with endovaginal exam following the transabdominal exam to visualize the bilateral ovaries and endometrium. COMPARISON:  None FINDINGS: Uterus Measurements: 14.3 x 9.3 x 11.5 cm = volume: 797 mL. Leiomyomas in the posterior uterus and arising from the uterine fundus. At the RIGHT uterine fundus a 5.0 x 4.4 x 4.6 cm leiomyoma is demonstrated. Along the posterior uterine fundus a smaller adjacent leiomyoma is 4.3 x 3.5 x 3.7 cm. Along the posterior RIGHT lateral uterus is an additional leiomyoma measuring 3.0 x 2.8 x 3.9 cm. The show posterior acoustic shadowing. Other smaller leiomyomata are present in the uterus as well, numerous areas. Some of these obscure full visualization of the endometrium. Endometrium Thickness: 31 mm with more focal area towards the fundal portion of the endometrium. Some boundaries are slightly indistinct. Small hypoechoic area in the posterior aspect of the endometrium best seen on image 88 measuring approximately 8-9 mm. Right ovary Measurements: 3.8 x 2.6 x 3.0 = volume: 15.5 mL. Normal appearance/no adnexal mass. Left ovary Measurements: 5.7 x 2.6 x 3.5 = volume: 26.8 mL. Small follicles in the LEFT ovary. Other findings Trace free fluid in the pelvis. IMPRESSION: Marked thickening of the endometrium in this perimenopausal patient with small focal area of decreased echogenicity in the midst of endometrial thickening,  based on the pattern endometrial hyperplasia or even endometrial neoplasm are considered. Suggest gyn consultation if not yet performed and consider sonohysterogram or MRI for further evaluation on follow-up. Ultimately sampling may be warranted particularly of bleeding has remained unresponsive to hormonal therapy. Some areas of the endometrium are obscured by numerous leiomyomata some of these may be submucosal particularly midline posterior leiomyoma best seen on image 134 in the fundus of the uterus. Electronically Signed   By: Zetta Bills M.D.   On: 01/13/2021 15:13    Procedures Procedures   Medications Ordered in ED Medications - No data to display  ED Course  I have reviewed the triage vital signs and the nursing notes.  Pertinent labs & imaging results that were available during my care of the patient were reviewed by me and considered in my medical decision making (see chart for details).    MDM Rules/Calculators/A&P                         This patient presents to the ED for concern of abnormal uterine bleeding, this involves an extensive number of treatment options, and is a complaint that carries with it a high risk of complications and morbidity.  The differential diagnosis includes endometrial neoplasm, uterine fibroids, endometriosis, pelvic infection vs less likely foreign body or vaginal trauma   Additional history obtained:  Additional history obtained from chart review of previously prescribed medications   Lab Tests:  I Ordered, reviewed, and interpreted labs.  The pertinent results include:  negative pregnancy, mild anemia,. UA not completed d/t amount of blood    Imaging Studies ordered:  I ordered imaging studies including transvaginal ultrasound  I independently visualized and interpreted imaging which showed endometrial thickening with small area of hyperplasia vs neoplasm I agree with the radiologist interpretation    Medicines ordered and  prescription drug management:  No medications ordered in ED I have reviewed the patients home medicines and have made adjustments as needed    Consultations Obtained:  I requested consultation with Dr. Grandville Silos with OBGYN unassigned,  and discussed lab and imaging findings as well  as pertinent plan - they recommend: patient to follow up outpatient at [facility] for biopsy. Also recommend to start patient on 40mg  Megace once daily. She can increase to twice daily if bleeding not improved.      Dispostion:  After consideration of the diagnostic results and consultation with OBGYN feel that the patent would benefit from outpatient follow up with biopsy. Will prescribe Megace per OBGYN recommendations. I discussed these results and need for further investigation with patient who understands and is amenable to plan. She was given number to [facility] and instructed to call if she does not hear from them by Friday. All questions asked and answered.     Final Clinical Impression(s) / ED Diagnoses Final diagnoses:  Abnormal uterine bleeding    Rx / DC Orders ED Discharge Orders          Ordered    megestrol (MEGACE) 40 MG tablet  Daily       Note to Pharmacy: Can increase to 40mg  BID if bleeding does not improve   01/13/21 [address], [place] 01/13/21 1641    Godfrey Pick, MD 01/13/21 2216

## 2021-01-13 NOTE — ED Triage Notes (Signed)
PT c/o increased vaginal bleeding x 1 month. Pt reports soaking multiple pads a day. Pt has been seen by PCP, but symptoms have worsened and now endorses increase fatigued.  PMH: tube ligation, HTN, arthritis.

## 2021-01-13 NOTE — Discharge Instructions (Addendum)
Ultrasound of your uterus today shows endometrial thickening from an unknown cause.  I have consulted with our OB/GYN service who recommend you set up an appointment with the women's health clinic for a biopsy.  I have attached their number here above; it is a call you by Fridays please call them to set this appointment up.  Until then I have sent you in a prescription for Megace that should help with your vaginal bleeding.  You can take one 40 mg tablet once daily, however you may increase this up to twice daily if you do not see improvement.  Please return to the ED if you develop severe pelvic pain, severe vaginal pain or change in mental status.

## 2021-01-13 NOTE — ED Provider Notes (Signed)
Emergency Medicine Provider Triage Evaluation Note  Melissa Riley , a 55 y.o. female  was evaluated in triage.  Pt complains of vaginal bleeding that has been ongoing since October.  Patient has been seeing her primary care doctor and has undergone 2 courses of medication which should have helped with her symptoms.  Patient does not know the name of this medication has not in her chart on review.  She finished her most recent course on Saturday, 4 days ago.  Since then, she feels that her bleeding has gotten worse and is soaking through a pad around every 2 hours.  She now endorses new fatigue and exhaustion.  She denies abdominal pain, pelvic pain, vaginal discharge vaginal pain, urinary symptoms, fever.  Has not fully gone through menopause, and prior to October she was starting to experience some hot flashes and believes at that her vaginal bleeding corresponding with menstrual changes.  Review of Systems  Positive: Vaginal bleeding, fatigue Negative: Pelvic pain, abdominal pain, vaginal discharge, vaginal pain, urinary symptoms  Physical Exam  BP (!) 178/76 (BP Location: Right Arm)    Pulse (!) 107    Temp 99.1 F (37.3 C) (Oral)    Resp 18    SpO2 100%  Gen:   Awake, no distress   Resp:  Normal effort  MSK:   Moves extremities without difficulty  Other:  Abdomen is soft nondistended, nontender to palpation  Medical Decision Making  Medically screening exam initiated at 11:52 AM.  Appropriate orders placed.  KIAUNA ZYWICKI was informed that the remainder of the evaluation will be completed by another provider, this initial triage assessment does not replace that evaluation, and the importance of remaining in the ED until their evaluation is complete.     Tonye Pearson, [place] 01/13/21 1156    Godfrey Pick, MD 01/13/21 2211

## 2021-02-04 ENCOUNTER — Other Ambulatory Visit (HOSPITAL_COMMUNITY)
Admission: RE | Admit: 2021-02-04 | Discharge: 2021-02-04 | Disposition: A | Discharge status: Home / Self Care | Payer: 59 | Source: Ambulatory Visit | Attending: Family Medicine | Admitting: Family Medicine

## 2021-02-04 ENCOUNTER — Encounter: Payer: Self-pay | Admitting: Family Medicine

## 2021-02-04 ENCOUNTER — Ambulatory Visit (INDEPENDENT_AMBULATORY_CARE_PROVIDER_SITE_OTHER): Payer: 59 | Admitting: Family Medicine

## 2021-02-04 ENCOUNTER — Other Ambulatory Visit: Payer: Self-pay

## 2021-02-04 VITALS — BP 158/83 | HR 75 | Ht 62.5 in | Wt 218.4 lb

## 2021-02-04 DIAGNOSIS — Z23 Encounter for immunization: Secondary | ICD-10-CM | POA: Diagnosis not present

## 2021-02-04 DIAGNOSIS — Z124 Encounter for screening for malignant neoplasm of cervix: Secondary | ICD-10-CM | POA: Diagnosis not present

## 2021-02-04 DIAGNOSIS — I340 Nonrheumatic mitral (valve) insufficiency: Secondary | ICD-10-CM

## 2021-02-04 DIAGNOSIS — J302 Other seasonal allergic rhinitis: Secondary | ICD-10-CM | POA: Insufficient documentation

## 2021-02-04 DIAGNOSIS — N939 Abnormal uterine and vaginal bleeding, unspecified: Secondary | ICD-10-CM | POA: Insufficient documentation

## 2021-02-04 DIAGNOSIS — I10 Essential (primary) hypertension: Secondary | ICD-10-CM | POA: Insufficient documentation

## 2021-02-04 MED ORDER — MEGESTROL ACETATE 40 MG PO TABS: 40.0000 mg | ORAL_TABLET | Freq: Two times a day (BID) | ORAL | 2 refills | Status: DC

## 2021-02-04 NOTE — Progress Notes (Signed)
Subjective:    Patient ID: Melissa Riley is a 56 y.o. female presenting with Procedure (Endometrial biopsy)  on 02/04/2021  HPI: Reports some night sweats and hot flashes. She has had increasingly irregular cycles and in the last 1-2 years very sporadically. In October, began bleeding daily.Went to the ED and found to be anemic with endoemtrial thickening. Findings are concerning for endometrial hyperplasia or neoplasm. Placed on Megace and bleeding became spotting and she ran out today.  Intentional 20# weight loss.  Review of Systems  Constitutional:  Negative for chills and fever.  Respiratory:  Positive for shortness of breath.   Cardiovascular:  Negative for chest pain.  Gastrointestinal:  Negative for abdominal pain, nausea and vomiting.  Genitourinary:  Positive for vaginal bleeding. Negative for dysuria.  Skin:  Negative for rash.     Objective:    BP (!) 158/83    Pulse 75    Ht 5' 2.5" (1.588 m)    Wt 218 lb 6.4 oz (99.1 kg)    LMP  (LMP Unknown) Comment: Had BTL 28 years ago   BMI 39.31 kg/m  Physical Exam Constitutional:      General: She is not in acute distress.    Appearance: She is well-developed.  HENT:     Head: Normocephalic and atraumatic.  Eyes:     General: No scleral icterus. Cardiovascular:     Rate and Rhythm: Normal rate.  Pulmonary:     Effort: Pulmonary effort is normal.  Abdominal:     Palpations: Abdomen is soft.  Musculoskeletal:     Cervical back: Neck supple.  Skin:    General: Skin is warm and dry.  Neurological:     Mental Status: She is alert and oriented to person, place, and time.   US PELVIC COMPLETE WITH TRANSVAGINAL  Result Date: 01/13/2021 CLINICAL DATA:  A 56 year old female presents with vaginal bleeding since October, potentially perimenopausal, reportedly with irregular cycle and bleeding. EXAM: TRANSABDOMINAL AND TRANSVAGINAL ULTRASOUND OF PELVIS TECHNIQUE: Both transabdominal and transvaginal ultrasound examinations  of the pelvis were performed. Transabdominal technique was performed for global imaging of the pelvis including uterus, ovaries, adnexal regions, and pelvic cul-de-sac. It was necessary to proceed with endovaginal exam following the transabdominal exam to visualize the bilateral ovaries and endometrium. COMPARISON:  None FINDINGS: Uterus Measurements: 14.3 x 9.3 x 11.5 cm = volume: 797 mL. Leiomyomas in the posterior uterus and arising from the uterine fundus. At the RIGHT uterine fundus a 5.0 x 4.4 x 4.6 cm leiomyoma is demonstrated. Along the posterior uterine fundus a smaller adjacent leiomyoma is 4.3 x 3.5 x 3.7 cm. Along the posterior RIGHT lateral uterus is an additional leiomyoma measuring 3.0 x 2.8 x 3.9 cm. The show posterior acoustic shadowing. Other smaller leiomyomata are present in the uterus as well, numerous areas. Some of these obscure full visualization of the endometrium. Endometrium Thickness: 31 mm with more focal area towards the fundal portion of the endometrium. Some boundaries are slightly indistinct. Small hypoechoic area in the posterior aspect of the endometrium best seen on image 88 measuring approximately 8-9 mm. Right ovary Measurements: 3.8 x 2.6 x 3.0 = volume: 15.5 mL. Normal appearance/no adnexal mass. Left ovary Measurements: 5.7 x 2.6 x 3.5 = volume: 26.8 mL. Small follicles in the LEFT ovary. Other findings Trace free fluid in the pelvis. IMPRESSION: Marked thickening of the endometrium in this perimenopausal patient with small focal area of decreased echogenicity in the midst of endometrial thickening, based  on the pattern endometrial hyperplasia or even endometrial neoplasm are considered. Suggest gyn consultation if not yet performed and consider sonohysterogram or MRI for further evaluation on follow-up. Ultimately sampling may be warranted particularly of bleeding has remained unresponsive to hormonal therapy. Some areas of the endometrium are obscured by numerous  leiomyomata some of these may be submucosal particularly midline posterior leiomyoma best seen on image 134 in the fundus of the uterus. Electronically Signed   By: Zetta Bills M.D.   On: 01/13/2021 15:13     Procedure: Patient given informed consent, signed copy in the chart, time out was performed. Appropriate time out taken.  The patient was placed in the lithotomy position and the cervix brought into view with sterile speculum.  Portio of cervix cleansed x 2 with betadine swabs.  A tenaculum was placed in the anterior lip of the cervix.  The uterus was sounded for depth of 11 cm. A pipelle was introduced to into the uterus, suction created,  and an endometrial sample was obtained. Pipelle passed x 2. All equipment was removed and accounted for.  The patient tolerated the procedure well.      Assessment & Plan:   Problem List Items Addressed This Visit       Unprioritized   Mitral regurgitation    Loud murmur and severe on last echo with moderate pulmonary HTN and patient feels SOB, no f/u--will send back to cards.      Relevant Orders   Ambulatory referral to Cardiology   Abnormal uterine bleeding    In the peri-menopause with abnormal imaging and concern for hyper/neoplasia. S/p EMB today--f/u based on results. Possibility of endometrial cancer discussed. Still spotting and with anemia, will increase Megace to BID and warned of appetite stimulation.      Relevant Medications   megestrol (MEGACE) 40 MG tablet   Other Relevant Orders   Surgical pathology( [facility]/ [facility])   Other Visit Diagnoses     Screening for cervical cancer    -  Primary   Relevant Orders   Cytology - PAP( [place])       No follow-ups on file.  [name] 02/04/2021 3:42 PM

## 2021-02-04 NOTE — Assessment & Plan Note (Signed)
Loud murmur and severe on last echo with moderate pulmonary HTN and patient feels SOB, no f/u--will send back to cards.

## 2021-02-04 NOTE — Assessment & Plan Note (Addendum)
In the peri-menopause with abnormal imaging and concern for hyper/neoplasia. S/p EMB today--f/u based on results. Possibility of endometrial cancer discussed. Still spotting and with anemia, will increase Megace to BID and warned of appetite stimulation.

## 2021-02-05 LAB — CYTOLOGY - PAP
Comment: NEGATIVE
Diagnosis: NEGATIVE
High risk HPV: NEGATIVE

## 2021-02-08 ENCOUNTER — Encounter: Payer: Self-pay | Admitting: Family Medicine

## 2021-02-08 LAB — SURGICAL PATHOLOGY

## 2021-02-18 ENCOUNTER — Other Ambulatory Visit: Payer: Self-pay

## 2021-02-18 ENCOUNTER — Ambulatory Visit (INDEPENDENT_AMBULATORY_CARE_PROVIDER_SITE_OTHER): Payer: 59 | Admitting: Cardiovascular Disease

## 2021-02-18 ENCOUNTER — Encounter: Payer: Self-pay | Admitting: Cardiovascular Disease

## 2021-02-18 VITALS — BP 150/92 | HR 83 | Ht 62.0 in | Wt 219.8 lb

## 2021-02-18 DIAGNOSIS — I340 Nonrheumatic mitral (valve) insufficiency: Secondary | ICD-10-CM

## 2021-02-18 DIAGNOSIS — R0602 Shortness of breath: Secondary | ICD-10-CM

## 2021-02-18 NOTE — Progress Notes (Signed)
Cardiology Office Note:   Date:  02/18/2021  NAME:  Melissa Riley    MRN: [id number] DOB:  05-Feb-1965   PCP:  Joycelyn Man, FNP  Cardiologist:  None  Electrophysiologist:  None   Referring MD: [name], MD   Chief Complaint  Patient presents with   Shortness of Breath    SOME SOB WHILE AT REST, NO CHEST PAIN OR CHEST TIGHTNESS.    History of Present Illness:   Melissa Riley is a 56 y.o. female with a hx of HTN, MR who is being seen today for the evaluation of mitral regurgitation at the request of [name], MD. she was diagnosed with moderate severe mitral regurgitation in 2019.  She apparently was lost to follow-up due to insurance issues.  She does have a loud murmur on exam.  She reports some shortness of breath with heavy activity and this is occurred over the past several months.  She works as a Optometrist.  She informs me that most routine activity does not get her short of breath.  She denies any chest pain or pressure.  Medical history is significant for hypertension.  BP 150/92.  Recently restarted on blood pressure medications.  She reports it is better controlled at home.  She has never had a heart attack or stroke.  She reports her mother had open heart surgery in her later years.  No history of mitral valve disorder in any other family members.  She is single.  She has 2 children.  She reports she has a grandchild.  She was seen by her OB/GYN and we referred to cardiology given her loud murmur.  She has no evidence of congestive heart failure on exam.  She has a prominent 3 out of 6 holosystolic murmur consistent with mitral valve regurgitation.  Problem List Mitral regurgitation -TEE 2019 -> PMVL prolapse, mod-sev MR 2. HTN  Past Medical History: Past Medical History:  Diagnosis Date   Heart murmur    Hypertension    Mitral regurgitation     Past Surgical History: Past Surgical History:  Procedure Laterality Date   TEE WITHOUT  CARDIOVERSION N/A 02/10/2017   Procedure: TRANSESOPHAGEAL ECHOCARDIOGRAM (TEE);  Surgeon: Adrian Prows, MD;  Location: [facility] ENDOSCOPY;  Service: Cardiovascular;  Laterality: N/A;   TUBAL LIGATION      Current Medications: Current Meds  Medication Sig   amLODipine (NORVASC) 5 MG tablet Take 5 mg by mouth daily.   aspirin 81 MG chewable tablet Chew 81 mg by mouth daily.   ibuprofen (ADVIL) 800 MG tablet Take 800 mg by mouth 3 (three) times daily.   lisinopril (ZESTRIL) 20 MG tablet Take 20 mg by mouth daily.   megestrol (MEGACE) 40 MG tablet Take 1 tablet (40 mg total) by mouth 2 (two) times daily.   Multiple Vitamins-Minerals (MULTIVITAMIN WITH MINERALS) tablet Take 1 tablet by mouth daily.     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  Tobacco Use   Smoking status: Never   Smokeless tobacco: Never  Vaping Use   Vaping Use: Not on file  Substance and Sexual Activity   Alcohol use: No   Drug use:  No   Sexual activity: Not Currently    Birth control/protection: Surgical  Other Topics Concern   Not on file  Social History Narrative   Not on file   Social Determinants of Health   Financial Resource Strain: Not on file  Food Insecurity: Not on file  Transportation Needs: Not on file  Physical Activity: Not on file  Stress: Not on file  Social Connections: Not on file     Family History: The patient's family history includes Breast cancer in her cousin and maternal aunt; Heart disease in her maternal aunt and mother; Hypertension in her mother.  ROS:   All other ROS reviewed and negative. Pertinent positives noted in the HPI.     EKGs/Labs/Other Studies Reviewed:   The following studies were personally  reviewed by me today:  EKG:  EKG is ordered today.  The ekg ordered today demonstrates normal sinus rhythm heart rate 83, LVH by voltage, and was personally reviewed by me.   Recent Labs: 01/13/2021: ALT 14; BUN 10; Creatinine, Ser 0.74; Hemoglobin 9.5; Platelets 304; Potassium 3.8; Sodium 135   Recent Lipid Panel No results found for: CHOL, TRIG, HDL, CHOLHDL, VLDL, LDLCALC, LDLDIRECT  Physical Exam:   VS:  BP (!) 150/92    Pulse 83    Ht 5\' 2"  (1.575 m)    Wt 219 lb 12.8 oz (99.7 kg)    LMP  (LMP Unknown) Comment: Had BTL 28 years ago   SpO2 98%    BMI 40.20 kg/m    Wt Readings from Last 3 Encounters:  02/18/21 219 lb 12.8 oz (99.7 kg)  02/04/21 218 lb 6.4 oz (99.1 kg)  02/10/17 209 lb (94.8 kg)    General: Well nourished, well developed, in no acute distress Head: Atraumatic, normal size  Eyes: PEERLA, EOMI  Neck: Supple, no JVD Endocrine: No thryomegaly Cardiac: Normal S1, S2; RRR; 3 out of 6 holosystolic murmur Lungs: Clear to auscultation bilaterally, no wheezing, rhonchi or rales  Abd: Soft, nontender, no hepatomegaly  Ext: No edema, pulses 2+ Musculoskeletal: No deformities, BUE and BLE strength normal and equal Skin: Warm and dry, no rashes   Neuro: Alert and oriented to person, place, time, and situation, CNII-XII grossly intact, no focal deficits  Psych: Normal mood and affect   ASSESSMENT:   Melissa Riley is a 56 y.o. female who presents for the following: 1. Nonrheumatic mitral valve regurgitation   2. SOB (shortness of breath)     PLAN:   1. Nonrheumatic mitral valve regurgitation 2. SOB (shortness of breath) -Known history of mitral valve regurgitation.  Transesophageal echocardiogram report from 2019 (I cannot view the images) mentions prolapse of the posterior mitral valve leaflet with eccentric regurgitation that was quantified as moderate to severe.  She has not followed up since then.  Apparently she lost her insurance.  She does report intermittent  shortness of breath but has no evidence of congestive heart failure examination.  I do fear that she has severe mitral valve regurgitation and this needs to be evaluated quickly.  We will set her up with an echocardiogram to reevaluate.  Murmur is very prominent and consistent with mitral valve regurgitation.  Given her symptoms of shortness of breath we will check a full panel of labs including BMP, BNP, CBC.  She may ultimately end up needing transesophageal echo as well as left and right heart catheterization.  We will await the results of her echo before we do this.  Disposition: Return in  about 3 months (around 05/19/2021).  Medication Adjustments/Labs and Tests Ordered: Current medicines are reviewed at length with the patient today.  Concerns regarding medicines are outlined above.  Orders Placed This Encounter  Procedures   CBC   Basic metabolic panel   Brain natriuretic peptide   EKG 12-Lead   ECHOCARDIOGRAM COMPLETE   No orders of the defined types were placed in this encounter.   Patient Instructions  Medication Instructions:  The current medical regimen is effective;  continue present plan and medications.  *If you need a refill on your cardiac medications before your next appointment, please call your pharmacy*   Lab Work: CBC, BMET, BNP today   If you have labs (blood work) drawn today and your tests are completely normal, you will receive your results only by: [place] (if you have MyChart) OR A paper copy in the mail If you have any lab test that is abnormal or we need to change your treatment, we will call you to review the results.   Testing/Procedures: Echocardiogram (ASAP, next week if openings) - Your physician has requested that you have an echocardiogram. Echocardiography is a painless test that uses sound waves to create images of your heart. It provides your doctor with information about the size and shape of your heart and how well your hearts  chambers and valves are working. This procedure takes approximately one hour. There are no restrictions for this procedure. This will be performed at either our [facility] location - [address] [place] location [facility] 2nd floor.    Follow-Up: At [facility], you and your health needs are our priority.  As part of our continuing mission to provide you with exceptional heart care, we have created designated Provider Care Teams.  These Care Teams include your primary Cardiologist (physician) and Advanced Practice Providers (APPs -  Physician Assistants and Nurse Practitioners) who all work together to provide you with the care you need, when you need it.  We recommend signing up for the patient portal called "MyChart".  Sign up information is provided on this After Visit Summary.  MyChart is used to connect with patients for Virtual Visits (Telemedicine).  Patients are able to view lab/test results, encounter notes, upcoming appointments, etc.  Non-urgent messages can be sent to your provider as well.   To learn more about what you can do with MyChart, go to [email or web address].    Your next appointment:   3 month(s)  The format for your next appointment:   In Person  Provider:   Eleonore Chiquito, MD       Signed, Addison Naegeli. Audie Box, MD, [place]  [address], [place], [place] [address] [phone]  02/18/2021 10:14 AM

## 2021-02-18 NOTE — Patient Instructions (Signed)
Medication Instructions:  The current medical regimen is effective;  continue present plan and medications.  *If you need a refill on your cardiac medications before your next appointment, please call your pharmacy*   Lab Work: CBC, BMET, BNP today   If you have labs (blood work) drawn today and your tests are completely normal, you will receive your results only by: [place] (if you have MyChart) OR A paper copy in the mail If you have any lab test that is abnormal or we need to change your treatment, we will call you to review the results.   Testing/Procedures: Echocardiogram (ASAP, next week if openings) - Your physician has requested that you have an echocardiogram. Echocardiography is a painless test that uses sound waves to create images of your heart. It provides your doctor with information about the size and shape of your heart and how well your hearts chambers and valves are working. This procedure takes approximately one hour. There are no restrictions for this procedure. This will be performed at either our [facility] location - [address] [place] location [facility] 2nd floor.    Follow-Up: At [facility], you and your health needs are our priority.  As part of our continuing mission to provide you with exceptional heart care, we have created designated Provider Care Teams.  These Care Teams include your primary Cardiologist (physician) and Advanced Practice Providers (APPs -  Physician Assistants and Nurse Practitioners) who all work together to provide you with the care you need, when you need it.  We recommend signing up for the patient portal called "MyChart".  Sign up information is provided on this After Visit Summary.  MyChart is used to connect with patients for Virtual Visits (Telemedicine).  Patients are able to view lab/test results, encounter notes, upcoming appointments, etc.  Non-urgent messages can be sent to your  provider as well.   To learn more about what you can do with MyChart, go to [email or web address].    Your next appointment:   3 month(s)  The format for your next appointment:   In Person  Provider:   Eleonore Chiquito, MD

## 2021-02-19 ENCOUNTER — Other Ambulatory Visit: Payer: Self-pay

## 2021-02-19 LAB — CBC
Hematocrit: 37.8 % (ref 34.0–46.6)
Hemoglobin: 13 g/dL (ref 11.1–15.9)
MCH: 31.8 pg (ref 26.6–33.0)
MCHC: 34.4 g/dL (ref 31.5–35.7)
MCV: 92 fL (ref 79–97)
Platelets: 234 10*3/uL (ref 150–450)
RBC: 4.09 x10E6/uL (ref 3.77–5.28)
RDW: 11.4 % — ABNORMAL LOW (ref 11.7–15.4)
WBC: 5.7 10*3/uL (ref 3.4–10.8)

## 2021-02-19 LAB — BASIC METABOLIC PANEL
BUN/Creatinine Ratio: 16 (ref 9–23)
BUN: 13 mg/dL (ref 6–24)
CO2: 20 mmol/L (ref 20–29)
Calcium: 9.5 mg/dL (ref 8.7–10.2)
Chloride: 106 mmol/L (ref 96–106)
Creatinine, Ser: 0.82 mg/dL (ref 0.57–1.00)
Glucose: 97 mg/dL (ref 70–99)
Potassium: 3.9 mmol/L (ref 3.5–5.2)
Sodium: 140 mmol/L (ref 134–144)
eGFR: 85 mL/min/{1.73_m2} (ref >59)

## 2021-02-19 LAB — BRAIN NATRIURETIC PEPTIDE: BNP: 21 pg/mL (ref 0.0–100.0)

## 2021-02-26 ENCOUNTER — Ambulatory Visit (INDEPENDENT_AMBULATORY_CARE_PROVIDER_SITE_OTHER): Payer: 59

## 2021-02-26 ENCOUNTER — Other Ambulatory Visit: Payer: Self-pay

## 2021-02-26 DIAGNOSIS — I340 Nonrheumatic mitral (valve) insufficiency: Secondary | ICD-10-CM | POA: Diagnosis not present

## 2021-02-26 LAB — ECHOCARDIOGRAM COMPLETE
AR max vel: 2.52 cm2
AV Area VTI: 2.18 cm2
AV Area mean vel: 2.33 cm2
AV Mean grad: 4 mmHg
AV Peak grad: 8.5 mmHg
Ao pk vel: 1.46 m/s
Area-P 1/2: 5.84 cm2
Calc EF: 70.2 %
MV M vel: 2.8 m/s
MV Peak grad: 31.4 mmHg
S' Lateral: 3.14 cm
Single Plane A2C EF: 71 %
Single Plane A4C EF: 70.7 %

## 2021-03-08 NOTE — H&P (View-Only) (Signed)
Cardiology Office Note:   Date:  03/11/2021  NAME:  Melissa Riley    MRN: [id number] DOB:  September 15, 1965   PCP:  Joycelyn Man, FNP  Cardiologist:  None  Electrophysiologist:  None   Referring MD: Joycelyn Man, FNP   Chief Complaint  Patient presents with   Follow-up    History of Present Illness:   Melissa Riley is a 56 y.o. female with a hx of severe MR who presents for follow-up.  She reports she can get short of breath with some activity.  Not that active.  I encouraged her to remain active.  She reports no chest pain.  EKG shows sinus rhythm with LVH.  BP is 150/82.  We discussed increasing her lisinopril.  She is okay to do this.  We discussed her condition which is severe mitral valve regurgitation.  She has severe prolapse of the posterior leaflet with possible flail segment.  We discussed proceeding with transesophageal echocardiogram as well as left and right heart catheterization.  She is willing to do this.  She has had chamber dilation of the left atrium.  Strain is mildly reduced.  It is time for repair.  She is willing to proceed with work-up.  We also discussed that the surgeon to repair his mitral valve disease currently at [facility].  We do not have mitral valve surgeon at [facility].  She is okay to proceed with work-up here and then transfer surgical care to [facility].  Problem List Mitral regurgitation -TEE 2019 -> PMVL prolapse, mod-sev MR 2. HTN  Past Medical History: Past Medical History:  Diagnosis Date   Heart murmur    Hypertension    Mitral regurgitation     Past Surgical History: Past Surgical History:  Procedure Laterality Date   TEE WITHOUT CARDIOVERSION N/A 02/10/2017   Procedure: TRANSESOPHAGEAL ECHOCARDIOGRAM (TEE);  Surgeon: Adrian Prows, MD;  Location: [facility] ENDOSCOPY;  Service: Cardiovascular;  Laterality: N/A;   TUBAL LIGATION      Current Medications: Current Meds  Medication Sig   amLODipine (NORVASC) 5 MG tablet Take 5 mg by mouth  daily.   ibuprofen (ADVIL) 800 MG tablet Take 800 mg by mouth 3 (three) times daily.   megestrol (MEGACE) 40 MG tablet Take 1 tablet (40 mg total) by mouth 2 (two) times daily.   Multiple Vitamins-Minerals (MULTIVITAMIN WITH MINERALS) tablet Take 1 tablet by mouth daily.   [DISCONTINUED] aspirin 81 MG chewable tablet Chew 81 mg by mouth daily.   [DISCONTINUED] lisinopril (ZESTRIL) 20 MG tablet Take 20 mg by mouth daily.     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  Tobacco Use   Smoking status: Never   Smokeless tobacco: Never  Vaping Use   Vaping Use: Not on file  Substance and Sexual Activity   Alcohol use: No   Drug use: No   Sexual activity: Not Currently    Birth control/protection: Surgical  Other Topics Concern   Not on file  Social History Narrative   Not on file   Social Determinants of Health   Financial Resource Strain: Not on file  Food Insecurity: Not on file  Transportation Needs: Not on  file  Physical Activity: Not on file  Stress: Not on file  Social Connections: Not on file     Family History: The patient's family history includes Breast cancer in her cousin and maternal aunt; Heart disease in her maternal aunt and mother; Hypertension in her mother.  ROS:   All other ROS reviewed and negative. Pertinent positives noted in the HPI.     EKGs/Labs/Other Studies Reviewed:   The following studies were personally reviewed by me today:  EKG:  EKG is ordered today.  The ekg ordered today demonstrates normal sinus rhythm heart rate 87, LVH by voltage, and was personally reviewed by me.   TTE 02/26/2021  1. Left ventricular ejection fraction, by estimation, is 60 to 65%. The  left  ventricle has normal function. The left ventricle has no regional  wall motion abnormalities. Left ventricular diastolic parameters are  indeterminate. The average left  ventricular global longitudinal strain is -17.3 %. The global longitudinal  strain is normal.   2. Right ventricular systolic function is normal. The right ventricular  size is normal.   3. Left atrial size was severely dilated.   4. Right atrial size was mildly dilated.   5. The mitral valve is abnormal. Severe mitral valve regurgitation. No  evidence of mitral stenosis. There is severe holosystolic prolapse of the  middle scallop of the posterior leaflet of the mitral valve.   6. The aortic valve is tricuspid. Aortic valve regurgitation is not  visualized. No aortic stenosis is present.   Recent Labs: 01/13/2021: ALT 14 02/18/2021: BNP 21.0; BUN 13; Creatinine, Ser 0.82; Hemoglobin 13.0; Platelets 234; Potassium 3.9; Sodium 140   Recent Lipid Panel No results found for: CHOL, TRIG, HDL, CHOLHDL, VLDL, LDLCALC, LDLDIRECT  Physical Exam:   VS:  BP (!) 150/82    Pulse 87    Ht 5' 2.5" (1.588 m)    Wt 220 lb 3.2 oz (99.9 kg)    SpO2 99%    BMI 39.63 kg/m    Wt Readings from Last 3 Encounters:  03/11/21 220 lb 3.2 oz (99.9 kg)  02/18/21 219 lb 12.8 oz (99.7 kg)  02/04/21 218 lb 6.4 oz (99.1 kg)    General: Well nourished, well developed, in no acute distress Head: Atraumatic, normal size  Eyes: PEERLA, EOMI  Neck: Supple, no JVD Endocrine: No thryomegaly Cardiac: Normal S1, S2; RRR; 3 out of 6 holosystolic murmur Lungs: Clear to auscultation bilaterally, no wheezing, rhonchi or rales  Abd: Soft, nontender, no hepatomegaly  Ext: No edema, pulses 2+ Musculoskeletal: No deformities, BUE and BLE strength normal and equal Skin: Warm and dry, no rashes   Neuro: Alert and oriented to person, place, time, and situation, CNII-XII grossly intact, no focal deficits  Psych: Normal mood and affect   ASSESSMENT:   Melissa Riley is a 56 y.o. female who presents for the following: 1. Nonrheumatic mitral valve regurgitation   2. SOB (shortness of breath)   3. Primary hypertension    PLAN:   1. Nonrheumatic mitral valve regurgitation 2. SOB (shortness of breath) -She currently has severe mitral valve regurgitation.  She is short of breath with activity.  BNP is normal.  Echocardiogram demonstrates reduced LV strain as well as severe dilation of left atrium.  It is time for mitral valve repair.  I do not want her to develop arrhythmia such as A-fib or pulmonary hypertension. -Work-up with transesophageal echo as well as left and right heart catheterization.  Risk and  benefits were explained.  She is willing to proceed.  She can stop aspirin at this time.  No strong indication. -No difficulty swallowing.  Good candidate for TEE. -We discussed that she will need to be seen by a surgeon at [facility].  We unfortunately do not have a mitral valve surgery here.  I would like for her to be considered for minithoracotomy with mitral valve repair.  We will determine repair ability based on transesophageal echo. -Risk and benefits of both procedures were explained.  See consent statement below.  3. Primary hypertension -Increase lisinopril to 40 mg daily.  BP slightly elevated today 150/82.  She is on amlodipine 5 mg daily.  She will start checking this once a day.  Shared Decision Making/Informed Consent The risks [stroke (1 in 1000), death (1 in 1000), kidney failure [usually temporary] (1 in 500), bleeding (1 in 200), allergic reaction [possibly serious] (1 in 200)], benefits (diagnostic support and management of coronary artery disease) and alternatives of a cardiac catheterization were discussed in detail with Melissa Riley and she is willing to proceed.   The risks [esophageal damage, perforation (1:10,000 risk), bleeding, pharyngeal hematoma as well as other potential complications associated with conscious sedation including  aspiration, arrhythmia, respiratory failure and death], benefits (treatment guidance and diagnostic support) and alternatives of a transesophageal echocardiogram were discussed in detail with Melissa Riley and she is willing to proceed.   Disposition: Return in about 6 months (around 09/08/2021).  Medication Adjustments/Labs and Tests Ordered: Current medicines are reviewed at length with the patient today.  Concerns regarding medicines are outlined above.  Orders Placed This Encounter  Procedures   CBC   Basic metabolic panel   EKG [id number]   Meds ordered this encounter  Medications   lisinopril (ZESTRIL) 40 MG tablet    Sig: Take 1 tablet (40 mg total) by mouth daily.    Dispense:  90 tablet    Refill:  1    Patient Instructions  Medication Instructions:  Increase Lisinopril 40 mg daily   *If you need a refill on your cardiac medications before your next appointment, please call your pharmacy*   Lab Work: CBC, BMET today   If you have labs (blood work) drawn today and your tests are completely normal, you will receive your results only by: [place] (if you have MyChart) OR A paper copy in the mail If you have any lab test that is abnormal or we need to change your treatment, we will call you to review the results.   Testing/Procedures: Your physician has requested that you have a TEE. During a TEE, sound waves are used to create images of your heart. It provides your doctor with information about the size and shape of your heart and how well your hearts chambers and valves are working. In this test, a transducer is attached to the end of a flexible tube thats guided down your throat and into your esophagus (the tube leading from you mouth to your stomach) to get a more detailed image of your heart. You are not awake for the procedure. Please see the instruction sheet given to you today. For further information please visit [email or web address].  Your physician has  requested that you have a cardiac catheterization. Cardiac catheterization is used to diagnose and/or treat various heart conditions. Doctors may recommend this procedure for a number of different reasons. The most common reason is to evaluate chest pain. Chest pain can be a symptom of coronary artery disease (CAD),  and cardiac catheterization can show whether plaque is narrowing or blocking your hearts arteries. This procedure is also used to evaluate the valves, as well as measure the blood flow and oxygen levels in different parts of your heart. For further information please visit [email or web address]. Please follow instruction sheet, as given.    Follow-Up: At [facility], you and your health needs are our priority.  As part of our continuing mission to provide you with exceptional heart care, we have created designated Provider Care Teams.  These Care Teams include your primary Cardiologist (physician) and Advanced Practice Providers (APPs -  Physician Assistants and Nurse Practitioners) who all work together to provide you with the care you need, when you need it.  We recommend signing up for the patient portal called "MyChart".  Sign up information is provided on this After Visit Summary.  MyChart is used to connect with patients for Virtual Visits (Telemedicine).  Patients are able to view lab/test results, encounter notes, upcoming appointments, etc.  Non-urgent messages can be sent to your provider as well.   To learn more about what you can do with MyChart, go to [email or web address].    Your next appointment:   6 month(s)  The format for your next appointment:   In Person  Provider:   Eleonore Chiquito, MD    Other Instructions  You are scheduled for a TEE on March 13 with Dr. Gardiner Rhyme.  Please arrive at the [facility] (Main Entrance A) at [facility]: [address] [place], [place] [address] at 6:30 am. (1 hour prior to procedure unless lab work is needed; if lab work  is needed arrive 1.5 hours ahead)  DIET: Nothing to eat or drink after midnight except a sip of water with medications (see medication instructions below)  FYI: For your safety, and to allow [place] to monitor your vital signs accurately during the surgery/procedure we request that   if you have artificial nails, gel coating, SNS etc. Please have those removed prior to your surgery/procedure. Not having the nail coverings /polish removed may result in cancellation or delay of your surgery/procedure.   Labs: If patient is on Coumadin, patient needs pt/INR, CBC, BMET within 3 days (No pt/INR needed for patients taking Xarelto, Eliquis, Pradaxa) For patients receiving anesthesia for TEE and all Cardioversion patients: BMET, CBC within 1 week  You must have a responsible person to drive you home and stay in the waiting area during your procedure. Failure to do so could result in cancellation.  Bring your insurance cards.  *Special Note: Every effort is made to have your procedure done on time. Occasionally there are emergencies that occur at the hospital that may cause delays. Please be patient if a delay does occur.      [address] [place] [address] Dept: [phone] Loc: [place]  03/11/2021  You are scheduled for a Cardiac Catheterization on March 13th with Dr. Irish Lack  1. Please arrive at the [facility] (Main Entrance A) at [facility]: [phone] [place], [place] [address] at 9:00 AM (you will already be at the hospital)   Special note: Every effort is made to have your procedure done on time. Please understand that emergencies sometimes delay scheduled procedures.  2. Diet: Do not eat solid foods after midnight.  The patient may have clear liquids until 5am upon the day of the procedure.  3.  Labs: You will need to have blood drawn today. CBC,  BMET. You do not need to be fasting.  4. Medication instructions in preparation for your procedure:   Contrast Allergy: No   On the morning of your procedure you may take morning medicines NOT listed above.  You may use sips of water.  5. Plan for one night stay--bring personal belongings. 6. Bring a current list of your medications and current insurance cards. 7. You MUST have a responsible person to drive you home. 8. Someone MUST be with you the first 24 hours after you arrive home or your discharge will be delayed. 9. Please wear clothes that are easy to get on and off and wear slip-on shoes.  Thank you for allowing [place] to care for you!   -- [facility]      Time Spent with Patient: I have spent a total of 35 minutes with patient reviewing hospital notes, telemetry, EKGs, labs and examining the patient as well as establishing an assessment and plan that was discussed with the patient.  > 50% of time was spent in direct patient care.  Signed, Addison Naegeli. Audie Box, MD, [place]  [address] [place], [place] [address] [phone]  03/11/2021 10:28 AM

## 2021-03-08 NOTE — Progress Notes (Signed)
Cardiology Office Note:   Date:  03/11/2021  NAME:  Melissa Riley    MRN: [id number] DOB:  08-27-1965   PCP:  Joycelyn Man, FNP  Cardiologist:  None  Electrophysiologist:  None   Referring MD: Joycelyn Man, FNP   Chief Complaint  Patient presents with   Follow-up    History of Present Illness:   Melissa Riley is a 56 y.o. female with a hx of severe MR who presents for follow-up.  She reports she can get short of breath with some activity.  Not that active.  I encouraged her to remain active.  She reports no chest pain.  EKG shows sinus rhythm with LVH.  BP is 150/82.  We discussed increasing her lisinopril.  She is okay to do this.  We discussed her condition which is severe mitral valve regurgitation.  She has severe prolapse of the posterior leaflet with possible flail segment.  We discussed proceeding with transesophageal echocardiogram as well as left and right heart catheterization.  She is willing to do this.  She has had chamber dilation of the left atrium.  Strain is mildly reduced.  It is time for repair.  She is willing to proceed with work-up.  We also discussed that the surgeon to repair his mitral valve disease currently at [facility].  We do not have mitral valve surgeon at [facility].  She is okay to proceed with work-up here and then transfer surgical care to [facility].  Problem List Mitral regurgitation -TEE 2019 -> PMVL prolapse, mod-sev MR 2. HTN  Past Medical History: Past Medical History:  Diagnosis Date   Heart murmur    Hypertension    Mitral regurgitation     Past Surgical History: Past Surgical History:  Procedure Laterality Date   TEE WITHOUT CARDIOVERSION N/A 02/10/2017   Procedure: TRANSESOPHAGEAL ECHOCARDIOGRAM (TEE);  Surgeon: Adrian Prows, MD;  Location: [facility];  Service: Cardiovascular;  Laterality: N/A;   TUBAL LIGATION      Current Medications: Current Meds  Medication Sig   amLODipine (NORVASC) 5 MG tablet Take 5 mg by mouth  daily.   ibuprofen (ADVIL) 800 MG tablet Take 800 mg by mouth 3 (three) times daily.   megestrol (MEGACE) 40 MG tablet Take 1 tablet (40 mg total) by mouth 2 (two) times daily.   Multiple Vitamins-Minerals (MULTIVITAMIN WITH MINERALS) tablet Take 1 tablet by mouth daily.   [DISCONTINUED] aspirin 81 MG chewable tablet Chew 81 mg by mouth daily.   [DISCONTINUED] lisinopril (ZESTRIL) 20 MG tablet Take 20 mg by mouth daily.     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  Tobacco Use   Smoking status: Never   Smokeless tobacco: Never  Vaping Use   Vaping Use: Not on file  Substance and Sexual Activity   Alcohol use: No   Drug use: No   Sexual activity: Not Currently    Birth control/protection: Surgical  Other Topics Concern   Not on file  Social History Narrative   Not on file   Social Determinants of Health   Financial Resource Strain: Not on file  Food Insecurity: Not on file  Transportation Needs: Not on  file  Physical Activity: Not on file  Stress: Not on file  Social Connections: Not on file     Family History: The patient's family history includes Breast cancer in her cousin and maternal aunt; Heart disease in her maternal aunt and mother; Hypertension in her mother.  ROS:   All other ROS reviewed and negative. Pertinent positives noted in the HPI.     EKGs/Labs/Other Studies Reviewed:   The following studies were personally reviewed by me today:  EKG:  EKG is ordered today.  The ekg ordered today demonstrates normal sinus rhythm heart rate 87, LVH by voltage, and was personally reviewed by me.   TTE 02/26/2021  1. Left ventricular ejection fraction, by estimation, is 60 to 65%. The  left  ventricle has normal function. The left ventricle has no regional  wall motion abnormalities. Left ventricular diastolic parameters are  indeterminate. The average left  ventricular global longitudinal strain is -17.3 %. The global longitudinal  strain is normal.   2. Right ventricular systolic function is normal. The right ventricular  size is normal.   3. Left atrial size was severely dilated.   4. Right atrial size was mildly dilated.   5. The mitral valve is abnormal. Severe mitral valve regurgitation. No  evidence of mitral stenosis. There is severe holosystolic prolapse of the  middle scallop of the posterior leaflet of the mitral valve.   6. The aortic valve is tricuspid. Aortic valve regurgitation is not  visualized. No aortic stenosis is present.   Recent Labs: 01/13/2021: ALT 14 02/18/2021: BNP 21.0; BUN 13; Creatinine, Ser 0.82; Hemoglobin 13.0; Platelets 234; Potassium 3.9; Sodium 140   Recent Lipid Panel No results found for: CHOL, TRIG, HDL, CHOLHDL, VLDL, LDLCALC, LDLDIRECT  Physical Exam:   VS:  BP (!) 150/82    Pulse 87    Ht 5' 2.5" (1.588 m)    Wt 220 lb 3.2 oz (99.9 kg)    SpO2 99%    BMI 39.63 kg/m    Wt Readings from Last 3 Encounters:  03/11/21 220 lb 3.2 oz (99.9 kg)  02/18/21 219 lb 12.8 oz (99.7 kg)  02/04/21 218 lb 6.4 oz (99.1 kg)    General: Well nourished, well developed, in no acute distress Head: Atraumatic, normal size  Eyes: PEERLA, EOMI  Neck: Supple, no JVD Endocrine: No thryomegaly Cardiac: Normal S1, S2; RRR; 3 out of 6 holosystolic murmur Lungs: Clear to auscultation bilaterally, no wheezing, rhonchi or rales  Abd: Soft, nontender, no hepatomegaly  Ext: No edema, pulses 2+ Musculoskeletal: No deformities, BUE and BLE strength normal and equal Skin: Warm and dry, no rashes   Neuro: Alert and oriented to person, place, time, and situation, CNII-XII grossly intact, no focal deficits  Psych: Normal mood and affect   ASSESSMENT:   Melissa Riley is a 56 y.o. female who presents for the following: 1. Nonrheumatic mitral valve regurgitation   2. SOB (shortness of breath)   3. Primary hypertension    PLAN:   1. Nonrheumatic mitral valve regurgitation 2. SOB (shortness of breath) -She currently has severe mitral valve regurgitation.  She is short of breath with activity.  BNP is normal.  Echocardiogram demonstrates reduced LV strain as well as severe dilation of left atrium.  It is time for mitral valve repair.  I do not want her to develop arrhythmia such as A-fib or pulmonary hypertension. -Work-up with transesophageal echo as well as left and right heart catheterization.  Risk and  benefits were explained.  She is willing to proceed.  She can stop aspirin at this time.  No strong indication. -No difficulty swallowing.  Good candidate for TEE. -We discussed that she will need to be seen by a surgeon at [facility].  We unfortunately do not have a mitral valve surgery here.  I would like for her to be considered for minithoracotomy with mitral valve repair.  We will determine repair ability based on transesophageal echo. -Risk and benefits of both procedures were explained.  See consent statement below.  3. Primary hypertension -Increase lisinopril to 40 mg daily.  BP slightly elevated today 150/82.  She is on amlodipine 5 mg daily.  She will start checking this once a day.  Shared Decision Making/Informed Consent The risks [stroke (1 in 1000), death (1 in 1000), kidney failure [usually temporary] (1 in 500), bleeding (1 in 200), allergic reaction [possibly serious] (1 in 200)], benefits (diagnostic support and management of coronary artery disease) and alternatives of a cardiac catheterization were discussed in detail with Melissa Riley and she is willing to proceed.   The risks [esophageal damage, perforation (1:10,000 risk), bleeding, pharyngeal hematoma as well as other potential complications associated with conscious sedation including  aspiration, arrhythmia, respiratory failure and death], benefits (treatment guidance and diagnostic support) and alternatives of a transesophageal echocardiogram were discussed in detail with Melissa Riley and she is willing to proceed.   Disposition: Return in about 6 months (around 09/08/2021).  Medication Adjustments/Labs and Tests Ordered: Current medicines are reviewed at length with the patient today.  Concerns regarding medicines are outlined above.  Orders Placed This Encounter  Procedures   CBC   Basic metabolic panel   EKG 10-XNAT   Meds ordered this encounter  Medications   lisinopril (ZESTRIL) 40 MG tablet    Sig: Take 1 tablet (40 mg total) by mouth daily.    Dispense:  90 tablet    Refill:  1    Patient Instructions  Medication Instructions:  Increase Lisinopril 40 mg daily   *If you need a refill on your cardiac medications before your next appointment, please call your pharmacy*   Lab Work: CBC, BMET today   If you have labs (blood work) drawn today and your tests are completely normal, you will receive your results only by: [place] (if you have MyChart) OR A paper copy in the mail If you have any lab test that is abnormal or we need to change your treatment, we will call you to review the results.   Testing/Procedures: Your physician has requested that you have a TEE. During a TEE, sound waves are used to create images of your heart. It provides your doctor with information about the size and shape of your heart and how well your hearts chambers and valves are working. In this test, a transducer is attached to the end of a flexible tube thats guided down your throat and into your esophagus (the tube leading from you mouth to your stomach) to get a more detailed image of your heart. You are not awake for the procedure. Please see the instruction sheet given to you today. For further information please visit [email or web address].  Your physician has  requested that you have a cardiac catheterization. Cardiac catheterization is used to diagnose and/or treat various heart conditions. Doctors may recommend this procedure for a number of different reasons. The most common reason is to evaluate chest pain. Chest pain can be a symptom of coronary artery disease (CAD),  and cardiac catheterization can show whether plaque is narrowing or blocking your hearts arteries. This procedure is also used to evaluate the valves, as well as measure the blood flow and oxygen levels in different parts of your heart. For further information please visit [email or web address]. Please follow instruction sheet, as given.    Follow-Up: At [facility], you and your health needs are our priority.  As part of our continuing mission to provide you with exceptional heart care, we have created designated Provider Care Teams.  These Care Teams include your primary Cardiologist (physician) and Advanced Practice Providers (APPs -  Physician Assistants and Nurse Practitioners) who all work together to provide you with the care you need, when you need it.  We recommend signing up for the patient portal called "MyChart".  Sign up information is provided on this After Visit Summary.  MyChart is used to connect with patients for Virtual Visits (Telemedicine).  Patients are able to view lab/test results, encounter notes, upcoming appointments, etc.  Non-urgent messages can be sent to your provider as well.   To learn more about what you can do with MyChart, go to [email or web address].    Your next appointment:   6 month(s)  The format for your next appointment:   In Person  Provider:   Eleonore Chiquito, MD    Other Instructions  You are scheduled for a TEE on March 13 with Dr. Gardiner Rhyme.  Please arrive at the [facility] (Main Entrance A) at [facility]: [address] [place], [place] [address] at 6:30 am. (1 hour prior to procedure unless lab work is needed; if lab work  is needed arrive 1.5 hours ahead)  DIET: Nothing to eat or drink after midnight except a sip of water with medications (see medication instructions below)  FYI: For your safety, and to allow [place] to monitor your vital signs accurately during the surgery/procedure we request that   if you have artificial nails, gel coating, SNS etc. Please have those removed prior to your surgery/procedure. Not having the nail coverings /polish removed may result in cancellation or delay of your surgery/procedure.   Labs: If patient is on Coumadin, patient needs pt/INR, CBC, BMET within 3 days (No pt/INR needed for patients taking Xarelto, Eliquis, Pradaxa) For patients receiving anesthesia for TEE and all Cardioversion patients: BMET, CBC within 1 week  You must have a responsible person to drive you home and stay in the waiting area during your procedure. Failure to do so could result in cancellation.  Bring your insurance cards.  *Special Note: Every effort is made to have your procedure done on time. Occasionally there are emergencies that occur at the hospital that may cause delays. Please be patient if a delay does occur.      [address] [place] [address] Dept: [phone] Loc: [place]  03/11/2021  You are scheduled for a Cardiac Catheterization on March 13th with Dr. Irish Lack  1. Please arrive at the [facility] (Main Entrance A) at [facility]: [address] [place], [place] [address] at 9:00 AM (you will already be at the hospital)   Special note: Every effort is made to have your procedure done on time. Please understand that emergencies sometimes delay scheduled procedures.  2. Diet: Do not eat solid foods after midnight.  The patient may have clear liquids until 5am upon the day of the procedure.  3.  Labs: You will need to have blood drawn today. CBC,  BMET. You do not need to be fasting.  4. Medication instructions in preparation for your procedure:   Contrast Allergy: No   On the morning of your procedure you may take morning medicines NOT listed above.  You may use sips of water.  5. Plan for one night stay--bring personal belongings. 6. Bring a current list of your medications and current insurance cards. 7. You MUST have a responsible person to drive you home. 8. Someone MUST be with you the first 24 hours after you arrive home or your discharge will be delayed. 9. Please wear clothes that are easy to get on and off and wear slip-on shoes.  Thank you for allowing [place] to care for you!   -- [place] [facility]      Time Spent with Patient: I have spent a total of 35 minutes with patient reviewing hospital notes, telemetry, EKGs, labs and examining the patient as well as establishing an assessment and plan that was discussed with the patient.  > 50% of time was spent in direct patient care.  Signed, Addison Naegeli. Audie Box, MD, [place]  [address], [place], [place] [address] [phone]  03/11/2021 10:28 AM

## 2021-03-11 ENCOUNTER — Other Ambulatory Visit: Payer: Self-pay

## 2021-03-11 ENCOUNTER — Ambulatory Visit (INDEPENDENT_AMBULATORY_CARE_PROVIDER_SITE_OTHER): Payer: 59 | Admitting: Cardiovascular Disease

## 2021-03-11 ENCOUNTER — Encounter: Payer: Self-pay | Admitting: Cardiovascular Disease

## 2021-03-11 VITALS — BP 150/82 | HR 87 | Ht 62.5 in | Wt 220.2 lb

## 2021-03-11 DIAGNOSIS — R0602 Shortness of breath: Secondary | ICD-10-CM | POA: Diagnosis not present

## 2021-03-11 DIAGNOSIS — I10 Essential (primary) hypertension: Secondary | ICD-10-CM

## 2021-03-11 DIAGNOSIS — I340 Nonrheumatic mitral (valve) insufficiency: Secondary | ICD-10-CM | POA: Diagnosis not present

## 2021-03-11 LAB — CBC
Hematocrit: 42.1 % (ref 34.0–46.6)
Hemoglobin: 14 g/dL (ref 11.1–15.9)
MCH: 30.4 pg (ref 26.6–33.0)
MCHC: 33.3 g/dL (ref 31.5–35.7)
MCV: 91 fL (ref 79–97)
Platelets: 239 10*3/uL (ref 150–450)
RBC: 4.61 x10E6/uL (ref 3.77–5.28)
RDW: 11.6 % — ABNORMAL LOW (ref 11.7–15.4)
WBC: 5.5 10*3/uL (ref 3.4–10.8)

## 2021-03-11 LAB — BASIC METABOLIC PANEL
BUN/Creatinine Ratio: 16 (ref 9–23)
BUN: 10 mg/dL (ref 6–24)
CO2: 19 mmol/L — ABNORMAL LOW (ref 20–29)
Calcium: 9.4 mg/dL (ref 8.7–10.2)
Chloride: 102 mmol/L (ref 96–106)
Creatinine, Ser: 0.64 mg/dL (ref 0.57–1.00)
Glucose: 105 mg/dL — ABNORMAL HIGH (ref 70–99)
Potassium: 3.9 mmol/L (ref 3.5–5.2)
Sodium: 137 mmol/L (ref 134–144)
eGFR: 105 mL/min/{1.73_m2} (ref >59)

## 2021-03-11 MED ORDER — LISINOPRIL 40 MG PO TABS: 40.0000 mg | ORAL_TABLET | Freq: Every day | ORAL | 1 refills | Status: DC

## 2021-03-11 NOTE — Patient Instructions (Addendum)
Medication Instructions:  Increase Lisinopril 40 mg daily   *If you need a refill on your cardiac medications before your next appointment, please call your pharmacy*   Lab Work: CBC, BMET today   If you have labs (blood work) drawn today and your tests are completely normal, you will receive your results only by: [place] (if you have MyChart) OR A paper copy in the mail If you have any lab test that is abnormal or we need to change your treatment, we will call you to review the results.   Testing/Procedures: Your physician has requested that you have a TEE. During a TEE, sound waves are used to create images of your heart. It provides your doctor with information about the size and shape of your heart and how well your hearts chambers and valves are working. In this test, a transducer is attached to the end of a flexible tube thats guided down your throat and into your esophagus (the tube leading from you mouth to your stomach) to get a more detailed image of your heart. You are not awake for the procedure. Please see the instruction sheet given to you today. For further information please visit [email or web address].  Your physician has requested that you have a cardiac catheterization. Cardiac catheterization is used to diagnose and/or treat various heart conditions. Doctors may recommend this procedure for a number of different reasons. The most common reason is to evaluate chest pain. Chest pain can be a symptom of coronary artery disease (CAD), and cardiac catheterization can show whether plaque is narrowing or blocking your hearts arteries. This procedure is also used to evaluate the valves, as well as measure the blood flow and oxygen levels in different parts of your heart. For further information please visit [email or web address]. Please follow instruction sheet, as given.    Follow-Up: At [facility], you and your health needs are our priority.  As part of our continuing  mission to provide you with exceptional heart care, we have created designated Provider Care Teams.  These Care Teams include your primary Cardiologist (physician) and Advanced Practice Providers (APPs -  Physician Assistants and Nurse Practitioners) who all work together to provide you with the care you need, when you need it.  We recommend signing up for the patient portal called "MyChart".  Sign up information is provided on this After Visit Summary.  MyChart is used to connect with patients for Virtual Visits (Telemedicine).  Patients are able to view lab/test results, encounter notes, upcoming appointments, etc.  Non-urgent messages can be sent to your provider as well.   To learn more about what you can do with MyChart, go to [email or web address].    Your next appointment:   6 month(s)  The format for your next appointment:   In Person  Provider:   Eleonore Chiquito, MD    Other Instructions  You are scheduled for a TEE on March 13 with Dr. Gardiner Rhyme.  Please arrive at the [facility] (Main Entrance A) at [facility]: [address] [place], [place] [address] at 6:30 am. (1 hour prior to procedure unless lab work is needed; if lab work is needed arrive 1.5 hours ahead)  DIET: Nothing to eat or drink after midnight except a sip of water with medications (see medication instructions below)  FYI: For your safety, and to allow [place] to monitor your vital signs accurately during the surgery/procedure we request that   if you have artificial nails, gel coating, SNS etc. Please have  those removed prior to your surgery/procedure. Not having the nail coverings /polish removed may result in cancellation or delay of your surgery/procedure.   Labs: If patient is on Coumadin, patient needs pt/INR, CBC, BMET within 3 days (No pt/INR needed for patients taking Xarelto, Eliquis, Pradaxa) For patients receiving anesthesia for TEE and all Cardioversion patients: BMET, CBC within 1 week  You must  have a responsible person to drive you home and stay in the waiting area during your procedure. Failure to do so could result in cancellation.  Bring your insurance cards.  *Special Note: Every effort is made to have your procedure done on time. Occasionally there are emergencies that occur at the hospital that may cause delays. Please be patient if a delay does occur.      [place] [address] Dept: [phone] Loc: [place]  03/11/2021  You are scheduled for a Cardiac Catheterization on March 13th with Dr. Irish Lack  1. Please arrive at the [facility] (Main Entrance A) at [facility]: [address] [place], [place] [address] at 9:00 AM (you will already be at the hospital)   Special note: Every effort is made to have your procedure done on time. Please understand that emergencies sometimes delay scheduled procedures.  2. Diet: Do not eat solid foods after midnight.  The patient may have clear liquids until 5am upon the day of the procedure.  3. Labs: You will need to have blood drawn today. CBC, BMET. You do not need to be fasting.  4. Medication instructions in preparation for your procedure:   Contrast Allergy: No   On the morning of your procedure you may take morning medicines NOT listed above.  You may use sips of water.  5. Plan for one night stay--bring personal belongings. 6. Bring a current list of your medications and current insurance cards. 7. You MUST have a responsible person to drive you home. 8. Someone MUST be with you the first 24 hours after you arrive home or your discharge will be delayed. 9. Please wear clothes that are easy to get on and off and wear slip-on shoes.  Thank you for allowing [place] to care for you!   -- [place] [facility] services

## 2021-03-26 ENCOUNTER — Encounter (HOSPITAL_COMMUNITY): Payer: Self-pay | Admitting: Cardiology

## 2021-03-26 NOTE — Progress Notes (Signed)
Attempted to obtain medical history via telephone, unable to reach at this time. I left a voicemail to return pre surgical testing department's phone call.  

## 2021-04-01 ENCOUNTER — Telehealth: Payer: Self-pay | Admitting: *Deleted

## 2021-04-01 NOTE — Telephone Encounter (Addendum)
Cardiac Catheterization scheduled at [facility] for: Monday April 05, 2021 9 AM/TEE 7:30 AM ?Arrival time and place: [place] Entrance A  at: 6:30 AM ? ? ?Nothing to eat or drink after midnight, may have sips of water for medications. ? ? ?Medication instructions: ?-Usual morning medications can be taken with sips of water including aspirin 81 mg. ? ?Confirmed patient has responsible adult to drive home post procedure and be with patient first 24 hours after arriving home: ? ?One visitor is allowed to stay in the waiting room during the time you are at the hospital for your procedure.  ? ?Reviewed procedure instructions with patient. ?

## 2021-04-04 NOTE — Anesthesia Preprocedure Evaluation (Addendum)
Anesthesia Evaluation  ?Patient identified by MRN, date of birth, ID band ?Patient awake ? ? ? ?Reviewed: ?Allergy & Precautions, NPO status , Patient's Chart, lab work & pertinent test results ? ?History of Anesthesia Complications ?Negative for: history of anesthetic complications ? ?Airway ?Mallampati: II ? ?TM Distance: >3 FB ?Neck ROM: Full ? ? ? Dental ?no notable dental hx. ?(+) Dental Advisory Given ?  ?Pulmonary ?neg pulmonary ROS,  ?  ?Pulmonary exam normal ? ? ? ? ? ? ? Cardiovascular ?hypertension, Pt. on medications ?+ Valvular Problems/Murmurs MR  ?Rhythm:Regular Rate:Normal ?+ Systolic murmurs ?IMPRESSIONS  ? ? ??1. Left ventricular ejection fraction, by estimation, is 60 to 65%. The  ?left ventricle has normal function. The left ventricle has no regional  ?wall motion abnormalities. Left ventricular diastolic parameters are  ?indeterminate. The average left  ?ventricular global longitudinal strain is -17.3 %. The global longitudinal  ?strain is normal.  ??2. Right ventricular systolic function is normal. The right ventricular  ?size is normal.  ??3. Left atrial size was severely dilated.  ??4. Right atrial size was mildly dilated.  ??5. The mitral valve is abnormal. Severe mitral valve regurgitation. No  ?evidence of mitral stenosis. There is severe holosystolic prolapse of the  ?middle scallop of the posterior leaflet of the mitral valve.  ??6. The aortic valve is tricuspid. Aortic valve regurgitation is not  ?visualized. No aortic stenosis is present.  ? ?Comparison(s): No significant change from prior study. TEE EF 55%, mod  ?thickened MV leaflets, severe holosystic prolapse middle scallop posterior  ?leaflet.  ?  ?Neuro/Psych ?negative neurological ROS ?   ? GI/Hepatic ?negative GI ROS, Neg liver ROS,   ?Endo/Other  ?Morbid obesity ? Renal/GU ?negative Renal ROS  ? ?  ?Musculoskeletal ?negative musculoskeletal ROS ?(+)  ? Abdominal ?  ?Peds ? Hematology ?negative  hematology ROS ?(+)   ?Anesthesia Other Findings ? ? Reproductive/Obstetrics ? ?  ? ? ? ? ? ? ? ? ? ? ? ? ? ?  ?  ? ? ? ? ? ? ? ?Anesthesia Physical ?Anesthesia Plan ? ?ASA: 2 ? ?Anesthesia Plan: MAC  ? ?Post-op Pain Management: Minimal or no pain anticipated  ? ?Induction:  ? ?PONV Risk Score and Plan: Ondansetron and Propofol infusion ? ?Airway Management Planned: Natural Airway and Simple Face Mask ? ?Additional Equipment:  ? ?Intra-op Plan:  ? ?Post-operative Plan:  ? ?Informed Consent: I have reviewed the patients History and Physical, chart, labs and discussed the procedure including the risks, benefits and alternatives for the proposed anesthesia with the patient or authorized representative who has indicated his/her understanding and acceptance.  ? ? ? ?Dental advisory given ? ?Plan Discussed with: Anesthesiologist ? ?Anesthesia Plan Comments:   ? ? ? ? ? ?Anesthesia Quick Evaluation ? ?

## 2021-04-05 ENCOUNTER — Ambulatory Visit (HOSPITAL_COMMUNITY): Payer: 59 | Admitting: Anesthesiology

## 2021-04-05 ENCOUNTER — Encounter (HOSPITAL_COMMUNITY): Payer: Self-pay | Admitting: Interventional Cardiology

## 2021-04-05 ENCOUNTER — Ambulatory Visit (HOSPITAL_BASED_OUTPATIENT_CLINIC_OR_DEPARTMENT_OTHER)
Admission: RE | Admit: 2021-04-05 | Discharge: 2021-04-05 | Disposition: A | Discharge status: Home / Self Care | Payer: 59 | Source: Home / Self Care | Attending: Cardiovascular Disease | Admitting: Cardiovascular Disease

## 2021-04-05 ENCOUNTER — Other Ambulatory Visit: Payer: Self-pay

## 2021-04-05 ENCOUNTER — Ambulatory Visit (HOSPITAL_BASED_OUTPATIENT_CLINIC_OR_DEPARTMENT_OTHER): Payer: 59 | Admitting: Anesthesiology

## 2021-04-05 ENCOUNTER — Ambulatory Visit (HOSPITAL_COMMUNITY)
Admission: RE | Admit: 2021-04-05 | Discharge: 2021-04-05 | Disposition: A | Discharge status: Home / Self Care | Payer: 59 | Attending: Cardiology | Admitting: Cardiology

## 2021-04-05 ENCOUNTER — Encounter (HOSPITAL_COMMUNITY)
Admission: RE | Disposition: A | Discharge status: Home / Self Care | Payer: Self-pay | Source: Home / Self Care | Attending: Cardiology

## 2021-04-05 DIAGNOSIS — I10 Essential (primary) hypertension: Secondary | ICD-10-CM | POA: Diagnosis not present

## 2021-04-05 DIAGNOSIS — I340 Nonrheumatic mitral (valve) insufficiency: Secondary | ICD-10-CM

## 2021-04-05 DIAGNOSIS — R0602 Shortness of breath: Secondary | ICD-10-CM | POA: Diagnosis not present

## 2021-04-05 DIAGNOSIS — Z79899 Other long term (current) drug therapy: Secondary | ICD-10-CM | POA: Insufficient documentation

## 2021-04-05 HISTORY — PX: RIGHT/LEFT HEART CATH AND CORONARY ANGIOGRAPHY: CATH118266

## 2021-04-05 LAB — POCT I-STAT 7, (LYTES, BLD GAS, ICA,H+H)
Acid-base deficit: 4 mmol/L — ABNORMAL HIGH (ref 0.0–2.0)
Bicarbonate: 19.2 mmol/L — ABNORMAL LOW (ref 20.0–28.0)
Calcium, Ion: 1.16 mmol/L (ref 1.15–1.40)
HCT: 41 % (ref 36.0–46.0)
Hemoglobin: 13.9 g/dL (ref 12.0–15.0)
O2 Saturation: 97 %
Potassium: 3.3 mmol/L — ABNORMAL LOW (ref 3.5–5.1)
Sodium: 143 mmol/L (ref 135–145)
TCO2: 20 mmol/L — ABNORMAL LOW (ref 22–32)
pCO2 arterial: 30 mmHg — ABNORMAL LOW (ref 32–48)
pH, Arterial: 7.414 (ref 7.35–7.45)
pO2, Arterial: 89 mmHg (ref 83–108)

## 2021-04-05 LAB — POCT I-STAT EG7
Acid-base deficit: 3 mmol/L — ABNORMAL HIGH (ref 0.0–2.0)
Acid-base deficit: 3 mmol/L — ABNORMAL HIGH (ref 0.0–2.0)
Bicarbonate: 20.8 mmol/L (ref 20.0–28.0)
Bicarbonate: 21 mmol/L (ref 20.0–28.0)
Calcium, Ion: 1.2 mmol/L (ref 1.15–1.40)
Calcium, Ion: 1.2 mmol/L (ref 1.15–1.40)
HCT: 41 % (ref 36.0–46.0)
HCT: 42 % (ref 36.0–46.0)
Hemoglobin: 13.9 g/dL (ref 12.0–15.0)
Hemoglobin: 14.3 g/dL (ref 12.0–15.0)
O2 Saturation: 82 %
O2 Saturation: 83 %
Potassium: 3.4 mmol/L — ABNORMAL LOW (ref 3.5–5.1)
Potassium: 3.4 mmol/L — ABNORMAL LOW (ref 3.5–5.1)
Sodium: 142 mmol/L (ref 135–145)
Sodium: 143 mmol/L (ref 135–145)
TCO2: 22 mmol/L (ref 22–32)
TCO2: 22 mmol/L (ref 22–32)
pCO2, Ven: 34 mmHg — ABNORMAL LOW (ref 44–60)
pCO2, Ven: 34.2 mmHg — ABNORMAL LOW (ref 44–60)
pH, Ven: 7.394 (ref 7.25–7.43)
pH, Ven: 7.396 (ref 7.25–7.43)
pO2, Ven: 46 mmHg — ABNORMAL HIGH (ref 32–45)
pO2, Ven: 47 mmHg — ABNORMAL HIGH (ref 32–45)

## 2021-04-05 LAB — ECHO TEE
MV M vel: 4.15 m/s
MV Peak grad: 68.9 mmHg
Radius: 1.2 cm

## 2021-04-05 SURGERY — ECHOCARDIOGRAM, TRANSESOPHAGEAL
Anesthesia: Monitor Anesthesia Care

## 2021-04-05 SURGERY — RIGHT/LEFT HEART CATH AND CORONARY ANGIOGRAPHY
Anesthesia: LOCAL

## 2021-04-05 MED ORDER — MIDAZOLAM HCL 2 MG/2ML IJ SOLN
INTRAMUSCULAR | Status: AC
  Filled 2021-04-05: qty 2

## 2021-04-05 MED ORDER — IOHEXOL 350 MG/ML SOLN
INTRAVENOUS | Status: DC | PRN
  Administered 2021-04-05: 45 mL

## 2021-04-05 MED ORDER — LABETALOL HCL 5 MG/ML IV SOLN: 10.0000 mg | INTRAVENOUS | Status: DC | PRN

## 2021-04-05 MED ORDER — PHENYLEPHRINE 40 MCG/ML (10ML) SYRINGE FOR IV PUSH (FOR BLOOD PRESSURE SUPPORT)
PREFILLED_SYRINGE | INTRAVENOUS | Status: DC | PRN
  Administered 2021-04-05 (×2): 80 ug via INTRAVENOUS

## 2021-04-05 MED ORDER — HEPARIN (PORCINE) IN NACL 1000-0.9 UT/500ML-% IV SOLN
INTRAVENOUS | Status: DC | PRN
  Administered 2021-04-05 (×2): 500 mL

## 2021-04-05 MED ORDER — LIDOCAINE HCL (PF) 1 % IJ SOLN
INTRAMUSCULAR | Status: DC | PRN
  Administered 2021-04-05 (×2): 2 mL

## 2021-04-05 MED ORDER — SODIUM CHLORIDE 0.9 % IV SOLN: 250.0000 mL | INTRAVENOUS | Status: DC | PRN

## 2021-04-05 MED ORDER — LIDOCAINE HCL (PF) 1 % IJ SOLN
INTRAMUSCULAR | Status: AC
  Filled 2021-04-05: qty 30

## 2021-04-05 MED ORDER — SODIUM CHLORIDE 0.9 % WEIGHT BASED INFUSION: 3.0000 mL/kg/h | INTRAVENOUS | Status: AC

## 2021-04-05 MED ORDER — ACETAMINOPHEN 325 MG PO TABS: 650.0000 mg | ORAL_TABLET | ORAL | Status: DC | PRN

## 2021-04-05 MED ORDER — ASPIRIN 81 MG PO CHEW: 81.0000 mg | CHEWABLE_TABLET | ORAL | Status: DC

## 2021-04-05 MED ORDER — SODIUM CHLORIDE 0.9% FLUSH: 3.0000 mL | Freq: Two times a day (BID) | INTRAVENOUS | Status: DC

## 2021-04-05 MED ORDER — SODIUM CHLORIDE 0.9% FLUSH: 3.0000 mL | INTRAVENOUS | Status: DC | PRN

## 2021-04-05 MED ORDER — PROPOFOL 500 MG/50ML IV EMUL
INTRAVENOUS | Status: DC | PRN
  Administered 2021-04-05: 125 ug/kg/min via INTRAVENOUS

## 2021-04-05 MED ORDER — FENTANYL CITRATE (PF) 100 MCG/2ML IJ SOLN
INTRAMUSCULAR | Status: AC
  Filled 2021-04-05: qty 2

## 2021-04-05 MED ORDER — ONDANSETRON HCL 4 MG/2ML IJ SOLN
INTRAMUSCULAR | Status: DC | PRN
  Administered 2021-04-05: 4 mg via INTRAVENOUS

## 2021-04-05 MED ORDER — PROPOFOL 10 MG/ML IV BOLUS
INTRAVENOUS | Status: DC | PRN
  Administered 2021-04-05: 100 mg via INTRAVENOUS

## 2021-04-05 MED ORDER — ONDANSETRON HCL 4 MG/2ML IJ SOLN: 4.0000 mg | Freq: Four times a day (QID) | INTRAMUSCULAR | Status: DC | PRN

## 2021-04-05 MED ORDER — HEPARIN (PORCINE) IN NACL 1000-0.9 UT/500ML-% IV SOLN
INTRAVENOUS | Status: AC
  Filled 2021-04-05: qty 1000

## 2021-04-05 MED ORDER — VERAPAMIL HCL 2.5 MG/ML IV SOLN
INTRAVENOUS | Status: DC | PRN
  Administered 2021-04-05: 10 mL via INTRA_ARTERIAL

## 2021-04-05 MED ORDER — HYDRALAZINE HCL 20 MG/ML IJ SOLN: 10.0000 mg | INTRAMUSCULAR | Status: DC | PRN

## 2021-04-05 MED ORDER — DEXAMETHASONE SODIUM PHOSPHATE 10 MG/ML IJ SOLN
INTRAMUSCULAR | Status: DC | PRN
Start: 2021-04-05 — End: 2021-04-05
  Administered 2021-04-05: 10 mg via INTRAVENOUS

## 2021-04-05 MED ORDER — HEPARIN SODIUM (PORCINE) 1000 UNIT/ML IJ SOLN
INTRAMUSCULAR | Status: AC
  Filled 2021-04-05: qty 10

## 2021-04-05 MED ORDER — SODIUM CHLORIDE 0.9 % IV SOLN: INTRAVENOUS | Status: DC

## 2021-04-05 MED ORDER — FENTANYL CITRATE (PF) 100 MCG/2ML IJ SOLN
INTRAMUSCULAR | Status: DC | PRN
  Administered 2021-04-05: 25 ug via INTRAVENOUS

## 2021-04-05 MED ORDER — HEPARIN SODIUM (PORCINE) 1000 UNIT/ML IJ SOLN
INTRAMUSCULAR | Status: DC | PRN
  Administered 2021-04-05: 5000 [IU] via INTRAVENOUS

## 2021-04-05 MED ORDER — SODIUM CHLORIDE 0.9 % WEIGHT BASED INFUSION: 1.0000 mL/kg/h | INTRAVENOUS | Status: DC

## 2021-04-05 MED ORDER — SUCCINYLCHOLINE CHLORIDE 200 MG/10ML IV SOSY
PREFILLED_SYRINGE | INTRAVENOUS | Status: DC | PRN
  Administered 2021-04-05: 200 mg via INTRAVENOUS

## 2021-04-05 MED ORDER — VERAPAMIL HCL 2.5 MG/ML IV SOLN
INTRAVENOUS | Status: AC
  Filled 2021-04-05: qty 2

## 2021-04-05 MED ORDER — MIDAZOLAM HCL 2 MG/2ML IJ SOLN
INTRAMUSCULAR | Status: DC | PRN
  Administered 2021-04-05: 1 mg via INTRAVENOUS

## 2021-04-05 SURGICAL SUPPLY — 14 items
CATH BALLN WEDGE 5F 110CM (CATHETERS) ×2 IMPLANT
CATH INFINITI 5 FR JL3.5 (CATHETERS) ×2 IMPLANT
CATH INFINITI JR4 5F (CATHETERS) ×2 IMPLANT
DEVICE RAD COMP TR BAND LRG (VASCULAR PRODUCTS) ×4 IMPLANT
GLIDESHEATH SLEND SS 6F .021 (SHEATH) ×2 IMPLANT
GUIDEWIRE .025 260CM (WIRE) ×2 IMPLANT
GUIDEWIRE INQWIRE 1.5J.035X260 (WIRE) IMPLANT
INQWIRE 1.5J .035X260CM (WIRE) ×3
KIT HEART LEFT (KITS) ×4 IMPLANT
PACK CARDIAC CATHETERIZATION (CUSTOM PROCEDURE TRAY) ×4 IMPLANT
SHEATH GLIDE SLENDER 4/5FR (SHEATH) ×2 IMPLANT
TRANSDUCER W/STOPCOCK (MISCELLANEOUS) ×4 IMPLANT
TUBING CIL FLEX 10 FLL-RA (TUBING) ×4 IMPLANT
WIRE EMERALD 3MM-J .025X260CM (WIRE) ×2 IMPLANT

## 2021-04-05 NOTE — Transfer of Care (Signed)
Immediate Anesthesia Transfer of Care Note ? ?Patient: Melissa Riley ? ?Procedure(s) Performed: TRANSESOPHAGEAL ECHOCARDIOGRAM (TEE) ? ?Patient Location: PACU ? ?Anesthesia Type:General ? ?Level of Consciousness: awake, alert  and oriented ? ?Airway & Oxygen Therapy: Patient Spontanous Breathing and Patient connected to face mask oxygen ? ?Post-op Assessment: Report given to RN and Post -op Vital signs reviewed and stable ? ?Post vital signs: Reviewed and stable ? ?Last Vitals:  ?Vitals Value Taken Time  ?BP 149/92 04/05/21 0845  ?Temp 36.4 ?C 04/05/21 0845  ?Pulse 95 04/05/21 0845  ?Resp 26 04/05/21 0845  ?SpO2 97 % 04/05/21 0845  ?Vitals shown include unvalidated device data. ? ?Last Pain:  ?Vitals:  ? 04/05/21 0653  ?TempSrc: Temporal  ?PainSc: 0-No pain  ?   ? ?  ? ?Complications: No notable events documented. ?

## 2021-04-05 NOTE — Anesthesia Procedure Notes (Signed)
Procedure Name: Intubation ?Date/Time: 04/05/2021 8:00 AM ?Performed by: Griffin Dakin, CRNA ?Pre-anesthesia Checklist: Patient identified, Emergency Drugs available, Suction available and Patient being monitored ?Patient Re-evaluated:Patient Re-evaluated prior to induction ?Oxygen Delivery Method: Circle system utilized ?Preoxygenation: Pre-oxygenation with 100% oxygen ?Induction Type: IV induction ?Ventilation: Mask ventilation without difficulty ?Laryngoscope Size: Mac and 4 ?Grade View: Grade II ?Tube type: Oral ?Tube size: 7.5 mm ?Number of attempts: 1 ?Airway Equipment and Method: Stylet and Oral airway ?Placement Confirmation: ETT inserted through vocal cords under direct vision, positive ETCO2 and breath sounds checked- equal and bilateral ?Secured at: 22 cm ?Tube secured with: Tape ?Dental Injury: Teeth and Oropharynx as per pre-operative assessment  ? ? ? ? ?

## 2021-04-05 NOTE — CV Procedure (Addendum)
? ? ? ?  TRANSESOPHAGEAL ECHOCARDIOGRAM  ? ?NAME:  Melissa Riley   MRN: [id number] ?DOB:  1965-08-24   ADMIT DATE: 04/05/2021 ? ?INDICATIONS: ?Mitral regurgitation ? ?PROCEDURE:  ? ?Informed consent was obtained prior to the procedure. The risks, benefits and alternatives for the procedure were discussed and the patient comprehended these risks.  Risks include, but are not limited to, cough, sore throat, vomiting, nausea, somnolence, esophageal and stomach trauma or perforation, bleeding, low blood pressure, aspiration, pneumonia, infection, trauma to the teeth and death.   ? ?After a procedural time-out, the oropharynx was anesthetized and the patient was sedated by the anesthesia service. The transesophageal probe was inserted in the esophagus and stomach without difficulty and multiple views were obtained. Anesthesia was monitored by Dr Tobias Alexander and Viann Fish, CRNA.  ? ? ?COMPLICATIONS:   ? ?US probe was passed without difficulty but shortly into procedure patient desatted to less than SpO2 50%.  Probe was withdrawn and patient received bag mask ventilation until SpO2 normalized.  Laryngospasm was suspected.  She was more deeply sedated and procedure was attempted again.  Probe passed without difficulty but again patient had likely laryngospasm and SpO2 dropped less than 50%.  Probe was again withdrawn and patient received bag mask ventilation until SpO2 normalized.  Decision was made to intubate/paralyze so procedure could be completed.  Patient was intubated and US probe was passed without difficulty and procedure commenced without any issues.  However when endotracheal tube was removed at end of procedure, patient vomited.  There was no clear aspiration.  SpO2 was 100% and lungs clear on exam.   ? ?FINDINGS: ? ?Flail P1/P2, severe MR ? ?Oswaldo Milian MD ?[facility]  ?[address], [address] ?[place], [place] [address] ?[phone]  ? ?9:10 AM ? ? ?

## 2021-04-05 NOTE — Progress Notes (Signed)
?  Echocardiogram ?Echocardiogram Transesophageal has been performed. ? ?Melissa Riley ?04/05/2021, 9:02 AM ?

## 2021-04-05 NOTE — Interval H&P Note (Signed)
History and Physical Interval Note: ? ?04/05/2021 ?9:33 AM ? ?Melissa Riley  has presented today for surgery, with the diagnosis of mitral valve regurg.  The various methods of treatment have been discussed with the patient and family. After consideration of risks, benefits and other options for treatment, the patient has consented to  Procedure(s): ?RIGHT/LEFT HEART CATH AND CORONARY ANGIOGRAPHY (N/A) as a surgical intervention.  The patient's history has been reviewed, patient examined, no change in status, stable for surgery.  I have reviewed the patient's chart and labs.  Questions were answered to the patient's satisfaction.   ? ?Plan is for diagnostic only ? ?Larae Grooms ? ? ?

## 2021-04-05 NOTE — Interval H&P Note (Signed)
History and Physical Interval Note: ? ?04/05/2021 ?7:27 AM ? ?Melissa Riley  has presented today for surgery, with the diagnosis of MITRAL VALVE REGURGITATION.  The various methods of treatment have been discussed with the patient and family. After consideration of risks, benefits and other options for treatment, the patient has consented to  Procedure(s): ?TRANSESOPHAGEAL ECHOCARDIOGRAM (TEE) (N/A) as a surgical intervention.  The patient's history has been reviewed, patient examined, no change in status, stable for surgery.  I have reviewed the patient's chart and labs.  Questions were answered to the patient's satisfaction.   ? ? ?[name] ? ? ?

## 2021-04-06 ENCOUNTER — Encounter (HOSPITAL_COMMUNITY): Payer: Self-pay | Admitting: Cardiology

## 2021-04-06 NOTE — Anesthesia Postprocedure Evaluation (Signed)
Anesthesia Post Note ? ?Patient: Melissa Riley ? ?Procedure(s) Performed: TRANSESOPHAGEAL ECHOCARDIOGRAM (TEE) ? ?  ? ?Patient location during evaluation: PACU ?Anesthesia Type: MAC ?Level of consciousness: awake and alert ?Pain management: pain level controlled ?Vital Signs Assessment: post-procedure vital signs reviewed and stable ?Respiratory status: spontaneous breathing ?Cardiovascular status: stable ?Anesthetic complications: no ? ? ?No notable events documented. ? ?Last Vitals:  ?Vitals:  ? 04/05/21 1235 04/05/21 1245  ?BP:    ?Pulse: 87 91  ?Resp:    ?Temp:    ?SpO2: 100% 100%  ?  ?Last Pain:  ?Vitals:  ? 04/05/21 1029  ?TempSrc:   ?PainSc: 0-No pain  ? ? ?  ?  ?  ?  ?  ?  ? ?Nolon Nations ? ? ? ? ?

## 2021-05-14 ENCOUNTER — Ambulatory Visit: Payer: No Typology Code available for payment source | Admitting: Cardiovascular Disease

## 2021-10-20 ENCOUNTER — Other Ambulatory Visit: Payer: Self-pay | Admitting: Family Medicine

## 2021-10-20 DIAGNOSIS — N939 Abnormal uterine and vaginal bleeding, unspecified: Secondary | ICD-10-CM

## 2021-10-22 ENCOUNTER — Other Ambulatory Visit: Payer: Self-pay | Admitting: Family Medicine

## 2021-10-22 ENCOUNTER — Telehealth: Payer: Self-pay | Admitting: Family Medicine

## 2021-10-22 DIAGNOSIS — Z1231 Encounter for screening mammogram for malignant neoplasm of breast: Secondary | ICD-10-CM

## 2021-10-22 DIAGNOSIS — N939 Abnormal uterine and vaginal bleeding, unspecified: Secondary | ICD-10-CM

## 2021-10-22 NOTE — Telephone Encounter (Signed)
Patient requesting a Refill on Megace, patient has been put on our list to be called when a appointment is open.

## 2021-10-25 MED ORDER — MEGESTROL ACETATE 40 MG PO TABS: 40.0000 mg | ORAL_TABLET | Freq: Two times a day (BID) | ORAL | 2 refills | Status: DC

## 2021-10-25 NOTE — Telephone Encounter (Signed)
Call placed back to pt. Pt did not answer. Left detailed VM with Rx refill. Pt advised to return call to office to schedule appt with provider for more refills and exam.  Colletta Maryland, [facility]

## 2021-11-01 ENCOUNTER — Ambulatory Visit
Admission: RE | Admit: 2021-11-01 | Discharge: 2021-11-01 | Disposition: A | Discharge status: Home / Self Care | Payer: Commercial Managed Care - HMO | Source: Ambulatory Visit | Attending: Family Medicine | Admitting: Family Medicine

## 2021-11-01 DIAGNOSIS — Z1231 Encounter for screening mammogram for malignant neoplasm of breast: Secondary | ICD-10-CM

## 2021-11-03 ENCOUNTER — Telehealth: Payer: Self-pay | Admitting: Family Medicine

## 2021-11-03 NOTE — Telephone Encounter (Signed)
Call placed to pharmacy. Reviewed Rx with pharmacy and was confirmed for how to take medication. Call placed back to pt. Left detailed VM with Rx. Pt advised to call back to office with any questions. Colletta Maryland, RNC

## 2021-11-03 NOTE — Telephone Encounter (Signed)
Patient called in saying she is unable to pick up her prescription due to the pharmacy needing clarification on how to take the medication. She is asking a nurse to contact the pharmacy so she it can be fixed and she is abe to pick up her prescription. The pharmacy is [facility] nurse at [facility]

## 2021-12-15 ENCOUNTER — Ambulatory Visit: Payer: Commercial Managed Care - HMO | Attending: Nurse Practitioner | Admitting: Nurse Practitioner

## 2021-12-15 ENCOUNTER — Encounter: Payer: Self-pay | Admitting: Nurse Practitioner

## 2021-12-15 VITALS — BP 130/82 | HR 90 | Ht 62.0 in | Wt 223.6 lb

## 2021-12-15 DIAGNOSIS — R0602 Shortness of breath: Secondary | ICD-10-CM | POA: Diagnosis not present

## 2021-12-15 DIAGNOSIS — I340 Nonrheumatic mitral (valve) insufficiency: Secondary | ICD-10-CM

## 2021-12-15 DIAGNOSIS — I493 Ventricular premature depolarization: Secondary | ICD-10-CM

## 2021-12-15 DIAGNOSIS — I10 Essential (primary) hypertension: Secondary | ICD-10-CM | POA: Diagnosis not present

## 2021-12-15 NOTE — Progress Notes (Addendum)
Office Visit    Patient Name: Melissa Riley Date of Encounter: 12/15/2021  Primary Care Provider:  Joycelyn Man, FNP Primary Cardiologist:  Evalina Field, MD  Chief Complaint    56 year old female with a history of mitral valve regurgitation, hypertension, and shortness of breath who presents for follow-up related to mitral valve regurgitation.  Past Medical History    Past Medical History:  Diagnosis Date   Heart murmur    Hypertension    Mitral regurgitation    Past Surgical History:  Procedure Laterality Date   RIGHT/LEFT HEART CATH AND CORONARY ANGIOGRAPHY N/A 04/05/2021   Procedure: RIGHT/LEFT HEART CATH AND CORONARY ANGIOGRAPHY;  Surgeon: Jettie Booze, MD;  Location: [place] [facility];  Service: Cardiovascular;  Laterality: N/A;   TEE WITHOUT CARDIOVERSION N/A 02/10/2017   Procedure: TRANSESOPHAGEAL ECHOCARDIOGRAM (TEE);  Surgeon: Adrian Prows, MD;  Location: [place];  Service: Cardiovascular;  Laterality: N/A;   TEE WITHOUT CARDIOVERSION N/A 04/05/2021   Procedure: TRANSESOPHAGEAL ECHOCARDIOGRAM (TEE);  Surgeon: [name], MD;  Location: [facility];  Service: Cardiovascular;  Laterality: N/A;   TUBAL LIGATION      Allergies  Allergies  Allergen Reactions   Amoxil [Amoxicillin] Other (See Comments)    Yeast infection    History of Present Illness    56 year old female with the above past medical history including mitral valve regurgitation, hypertension, and shortness of breath.   Prior TEE in 2019 (images not available for review) mentioned prolapse of the posterior mitral valve leaflet with eccentric regurgitation that was quantified as moderate to severe.  She was referred to Dr. Audie Box in January 2023 for further evaluation of mitral valve regurgitation in the setting of intermittent shortness of breath.  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  She was last seen in the office on 03/11/2021 and was stable from a cardiac standpoint.  She did note shortness of breath with activity.  Workup for mitral valve repair was recommended.  TEE in 03/2021 showed EF 60 to 65%, normal LV function, normal RV systolic function, flail I1/4 of the mitral valve with severe mitral valve regurgitation, eccentric anterior directed MR jet.  She underwent [facility] 03/2021 which revealed no evidence of CAD, mild pulmonary hypertension. She was advised to follow-up with [facility] cardiology for consideration of mitral valve surgery.   She presents today for follow-up. Since her last visit she has been stable from a cardiac standpoint.  She does note mild intermittent dyspnea on exertion, she denies chest pain, palpitations, edema, PND, orthopnea, weight gain.  She is interested in speaking with a cardiothoracic surgeon to discuss options for mitral valve surgery.  She works as a Print production planner.  She is getting ready to travel to visit her grandchildren for Thanksgiving.  Overall, she reports feeling well.  Home Medications    Current Outpatient Medications  Medication Sig Dispense Refill   acetaminophen (TYLENOL) 500 MG tablet Take 1,000 mg by mouth every 6 (six) hours as needed for moderate pain.     amLODipine (NORVASC) 5 MG tablet Take 5 mg by mouth daily.     hydrochlorothiazide (HYDRODIURIL) 25 MG tablet Take 25 mg by mouth daily.     levothyroxine (SYNTHROID) 25 MCG tablet Take 25 mcg by mouth daily.  lisinopril (ZESTRIL) 40 MG tablet Take 1 tablet (40 mg total) by mouth daily. 90 tablet 1   megestrol (MEGACE) 40 MG tablet Take 1 tablet (40 mg total) by mouth 2 (two) times daily. 180 tablet 2   Multiple Vitamins-Minerals (MULTIVITAMIN WITH MINERALS) tablet Take 1 tablet by mouth daily.     No  current facility-administered medications for this visit.     Review of Systems    She denies chest pain, palpitations, pnd, orthopnea, n, v, dizziness, syncope, edema, weight gain, or early satiety. All other systems reviewed and are otherwise negative except as noted above.   Physical Exam    VS:  BP 130/82   Pulse 90   Ht '5\' 2"'$  (1.575 m)   Wt 223 lb 9.6 oz (101.4 kg)   SpO2 97%   BMI 40.90 kg/m    GEN: Well nourished, well developed, in no acute distress. HEENT: normal. Neck: Supple, no JVD, carotid bruits, or masses. Cardiac: RRR, 2/6 murmur, no rubs, or gallops. No clubbing, cyanosis, edema.  Radials/DP/PT 2+ and equal bilaterally.  Respiratory:  Respirations regular and unlabored, clear to auscultation bilaterally. GI: Soft, nontender, nondistended, BS + x 4. MS: no deformity or atrophy. Skin: warm and dry, no rash. Neuro:  Strength and sensation are intact. Psych: Normal affect.  Accessory Clinical Findings    ECG personally reviewed by me today -sinus rhythm, 90 bpm, frequent PVCs- no acute changes.   Lab Results  Component Value Date   WBC 5.5 03/11/2021   HGB 13.9 04/05/2021   HCT 41.0 04/05/2021   MCV 91 03/11/2021   PLT 239 03/11/2021   Lab Results  Component Value Date   CREATININE 0.64 03/11/2021   BUN 10 03/11/2021   NA 142 04/05/2021   K 3.4 (L) 04/05/2021   CL 102 03/11/2021   CO2 19 (L) 03/11/2021   Lab Results  Component Value Date   ALT 14 01/13/2021   AST 21 01/13/2021   ALKPHOS 41 01/13/2021   BILITOT 0.7 01/13/2021   No results found for: "CHOL", "HDL", "LDLCALC", "LDLDIRECT", "TRIG", "CHOLHDL"  No results found for: "HGBA1C"  Assessment & Plan   1. Mitral valve regurgitation/shortness of breath: TEE in 03/2021 showed EF 60 to 65%, normal LV function, normal RV systolic function, flail F8/1 of the mitral valve with severe mitral valve regurgitation, eccentric anterior directed MR jet.  She underwent [facility] 03/2021 which revealed no  evidence of CAD, mild pulmonary hypertension.  Patient notes intermittent mild dyspnea on exertion, this has been stable since her last visit.  Euvolemic and well compensated on exam.  Will refer to CT surgery for further evaluation.   2. Hypertension: BP well controlled. Continue current antihypertensive regimen.   3.  PVCs: Noted on EKG today.  She denies palpitations.  4. Disposition: Follow-up in 3-4 months.      12/15/2021, 8:34 AM

## 2021-12-15 NOTE — Patient Instructions (Signed)
Medication Instructions:  Your physician recommends that you continue on your current medications as directed. Please refer to the Current Medication list given to you today.   *If you need a refill on your cardiac medications before your next appointment, please call your pharmacy*   Lab Work: NONE ordered at this time of appointment   If you have labs (blood work) drawn today and your tests are completely normal, you will receive your results only by: [place] (if you have MyChart) OR A paper copy in the mail If you have any lab test that is abnormal or we need to change your treatment, we will call you to review the results.   Testing/Procedures: NONE ordered at this time of appointment    Follow-Up: At [facility], you and your health needs are our priority.  As part of our continuing mission to provide you with exceptional heart care, we have created designated Provider Care Teams.  These Care Teams include your primary Cardiologist (physician) and Advanced Practice Providers (APPs -  Physician Assistants and Nurse Practitioners) who all work together to provide you with the care you need, when you need it.  We recommend signing up for the patient portal called "MyChart".  Sign up information is provided on this After Visit Summary.  MyChart is used to connect with patients for Virtual Visits (Telemedicine).  Patients are able to view lab/test results, encounter notes, upcoming appointments, etc.  Non-urgent messages can be sent to your provider as well.   To learn more about what you can do with MyChart, go to [email or web address].    Your next appointment:   3-4 month(s)  The format for your next appointment:   In Person  Provider:   Evalina Field, MD     Other Instructions Referral sent to CT Surgery   Important Information About Sugar

## 2021-12-30 ENCOUNTER — Encounter: Payer: Self-pay | Admitting: Cardiothoracic Surgery

## 2021-12-30 ENCOUNTER — Institutional Professional Consult (permissible substitution): Payer: Commercial Managed Care - HMO | Admitting: Cardiothoracic Surgery

## 2021-12-30 VITALS — BP 150/87 | HR 97 | Resp 20 | Ht 62.0 in | Wt 226.0 lb

## 2021-12-30 DIAGNOSIS — I340 Nonrheumatic mitral (valve) insufficiency: Secondary | ICD-10-CM

## 2021-12-30 NOTE — Progress Notes (Signed)
[address]       [place] [address]             [phone]                    Melissa Riley [facility] Medical Record #[id number] Date of Birth: 08-Feb-1965  Referring: Lenna Sciara, NP Primary Care: Joycelyn Man, FNP Primary Cardiologist: Evalina Field, MD  Chief Complaint:    Chief Complaint  Patient presents with   Mitral Regurgitation    Surgical consult, Cardiac Cath and ECHO 04/05/21/ ECHO 02/26/21    History of Present Illness:    Melissa Riley is a 56 y.o. female presents for evaluation of severe mitral regurgitation. She reports she can get short of breath with some activity.    She reports no chest pain.  EKG shows sinus rhythm with LVH.   She has severe prolapse of the posterior leaflet with possible flail segment. She is also seeing medical providers for bleeding fibroids. Changing pad 1-2 x day.  By cath no sig CAD. Pap 36/18 - elevated. Mean PA 27. Report possible palpitations in past but no definite history.    Past Medical History:  Diagnosis Date   Heart murmur    Hypertension    Mitral regurgitation     Past Surgical History:  Procedure Laterality Date   RIGHT/LEFT HEART CATH AND CORONARY ANGIOGRAPHY N/A 04/05/2021   Procedure: RIGHT/LEFT HEART CATH AND CORONARY ANGIOGRAPHY;  Surgeon: Jettie Booze, MD;  Location: [facility];  Service: Cardiovascular;  Laterality: N/A;   TEE WITHOUT CARDIOVERSION N/A 02/10/2017   Procedure: TRANSESOPHAGEAL ECHOCARDIOGRAM (TEE);  Surgeon: Adrian Prows, MD;  Location: [facility];  Service: Cardiovascular;  Laterality: N/A;   TEE WITHOUT CARDIOVERSION N/A 04/05/2021   Procedure: TRANSESOPHAGEAL ECHOCARDIOGRAM (TEE);  Surgeon: [name], MD;  Location: [facility];  Service: Cardiovascular;  Laterality: N/A;   TUBAL LIGATION      Family History  Problem Relation Age of Onset   Heart disease Mother    Hypertension Mother    Breast cancer Maternal Aunt     Heart disease Maternal Aunt    Breast cancer Cousin      Social History   Tobacco Use  Smoking Status Never  Smokeless Tobacco Never    Social History   Substance and Sexual Activity  Alcohol Use No     Allergies  Allergen Reactions   Amoxil [Amoxicillin] Other (See Comments)    Yeast infection    Current Outpatient Medications  Medication Sig Dispense Refill   acetaminophen (TYLENOL) 500 MG tablet Take 1,000 mg by mouth every 6 (six) hours as needed for moderate pain.     amLODipine (NORVASC) 5 MG tablet Take 5 mg by mouth daily.     hydrochlorothiazide (HYDRODIURIL) 25 MG tablet Take 25 mg by mouth daily.     levothyroxine (SYNTHROID) 25 MCG tablet Take 25 mcg by mouth daily.     lisinopril (ZESTRIL) 40 MG tablet Take 1 tablet (40 mg total) by mouth daily. 90 tablet 1   megestrol (MEGACE) 40 MG tablet Take 1 tablet (40 mg total) by mouth 2 (two) times daily. 180 tablet 2   Multiple Vitamins-Minerals (MULTIVITAMIN WITH MINERALS) tablet Take 1 tablet by mouth daily.     No current facility-administered medications for this visit.    ROS 14 point ROS reviewed and negative except as per HPI   PHYSICAL EXAMINATION:  BP (!) 150/87 (BP Location: Right Arm, Patient Position: Sitting, Cuff Size: Large)   Pulse 97   Resp 20   Ht '5\' 2"'$  (1.575 m)   Wt 226 lb (102.5 kg)   SpO2 96% Comment: RA  BMI 41.34 kg/m   Gen: NAD Neuro: Alert and oriented CV: regular, systolic murmur Resp: Nonlaboured Abd: Soft, ntnd Extr: WWP  Diagnostic Studies & Laboratory data:     Recent Radiology Findings:   No results found.     I have independently reviewed the above radiology studies  and reviewed the findings with the patient.   Recent Lab Findings: Lab Results  Component Value Date   WBC 5.5 03/11/2021   HGB 13.9 04/05/2021   HCT 41.0 04/05/2021   PLT 239 03/11/2021   GLUCOSE 105 (H) 03/11/2021   ALT 14 01/13/2021   AST 21 01/13/2021   NA 142 04/05/2021   K 3.4 (L)  04/05/2021   CL 102 03/11/2021   CREATININE 0.64 03/11/2021   BUN 10 03/11/2021   CO2 19 (L) 03/11/2021    Patient Name:   Melissa Riley Date of Exam: 04/05/2021  Medical Rec #:  [id number]         Height:       62.0 in  Accession #:    [id number]        Weight:       220.0 lb  Date of Birth:  1965-02-05         BSA:          1.991 m  Patient Age:    32 years          BP:           131/77 mmHg  Patient Gender: F                 HR:           98 bpm.  Exam Location:  Inpatient   Procedure: 3D Echo, Transesophageal Echo, Cardiac Doppler and Color  Doppler   Indications:    I34.1 Nonrheumatic mitral (valve) prolapse    History:         Patient has prior history of Echocardiogram examinations,  most                  recent 02/26/2021. Mitral Valve Disease,  Signs/Symptoms:Murmur;                  Risk Factors:Hypertension. Severe mitral regurgitation.    Sonographer:     Roseanna Rainbow RDCS  Referring Phys:  [id number] [place]  Diagnosing Phys: Oswaldo Milian MD   PROCEDURE: After discussion of the risks and benefits of a TEE, an  informed consent was obtained from the patient. The transesophogeal probe  was passed without difficulty through the esophogus of the patient. Imaged  were obtained with the patient in a  left lateral decubitus position. Sedation performed by different  physician. The patient was monitored while under deep sedation.  Anesthestetic sedation was provided intravenously by Anesthesiology: '324mg'$   of Propofol. The patient developed Respiratory  depression during the procedure.  US probe was passed without difficulty but shortly into procedure patient  desatted to less than SpO2 50%.  Probe was withdrawn and patient received  bag mask ventilation until SpO2 normalized.  Laryngospasm was suspected.   She was more deeply sedated and  procedure was attempted again.  Probe passed without difficulty but again  patient had likely laryngospasm and  SpO2  dropped less than 50%.  Probe was  again withdrawn and patient received bag mask ventilation until SpO2  normalized.  Decision was made to  intubate/paralyze so procedure could be completed.  Patient was intubated  and US probe was passed without difficulty and procedure commenced without  any issues.  However when endotracheal tube was removed at end of  procedure, patient vomited.  There was  no clear aspiration.  SpO2 was 100% and lungs clear on exam.      IMPRESSIONS     1. Left ventricular ejection fraction, by estimation, is 60 to 65%. The  left ventricle has normal function.   2. Right ventricular systolic function is normal. The right ventricular  size is normal.   3. Left atrial size was severely dilated. No left atrial/left atrial  appendage thrombus was detected.   4. The aortic valve is tricuspid. Aortic valve regurgitation is not  visualized. No aortic stenosis is present.   5. The mitral valve is abnormal. Flail P1/2. Severe mitral valve  regurgitation. Eccentric anterior directed MR jet. 2D ERO 0.8 cm^2, 3D VCA  0.6 cm^2, severe LA enlargement, systolic flow reversal in RUPV, all  consistent with severe MR.   FINDINGS   Left Ventricle: Left ventricular ejection fraction, by estimation, is 60  to 65%. The left ventricle has normal function. The left ventricular  internal cavity size was normal in size.   Right Ventricle: The right ventricular size is normal. No increase in  right ventricular wall thickness. Right ventricular systolic function is  normal.   Left Atrium: Left atrial size was severely dilated. No left atrial/left  atrial appendage thrombus was detected.   Right Atrium: Right atrial size was normal in size.   Pericardium: There is no evidence of pericardial effusion.   Mitral Valve: The mitral valve is abnormal. Severe mitral valve  regurgitation.   Tricuspid Valve: The tricuspid valve is normal in structure. Tricuspid  valve regurgitation is  trivial.   Aortic Valve: The aortic valve is tricuspid. Aortic valve regurgitation is  not visualized. No aortic stenosis is present.   Pulmonic Valve: The pulmonic valve was grossly normal. Pulmonic valve  regurgitation is trivial.   Aorta: The aortic root is normal in size and structure.   IAS/Shunts: No atrial level shunt detected by color flow Doppler.     LEFT VENTRICLE  PLAX 2D  LVOT diam:     2.20 cm  LV SV:         63  LV SV Index:   32        3D Volume EF  LVOT Area:     3.80 cm  LV 3D EDV:   103.10 ml                           LV 3D ESV:   36.68 ml   AORTIC VALVE  LVOT Vmax:   120.00 cm/s  LVOT Vmean:  62.800 cm/s  LVOT VTI:    0.167 m   MR Peak grad:    68.9 mmHg  MR Mean grad:    45.5 mmHg    SHUNTS  MR Vmax:         415.00 cm/s  Systemic VTI:  0.17 m  MR Vmean:        313.0 cm/s   Systemic Diam: 2.20 cm  MR PISA:         9.05 cm  MR PISA Eff  ROA: 84 mm  MR PISA Radius:  1.20 cm   Oswaldo Milian MD  Electronically signed by Oswaldo Milian MD  Signature Date/Time: 04/05/2021/11:26:11 AM    L/RHC    The left ventricular systolic function is normal.   LV end diastolic pressure is mildly elevated.   The left ventricular ejection fraction is 55-65% by visual estimate.   Hemodynamic findings consistent with mild pulmonary hypertension.   There is no aortic valve stenosis.   No angiographically apparent coronary artery disease   Aortic saturation 97%, PA saturation 83%, pulmonary artery pressure 36/18, mean pulmonary artery pressure 27 mmHg, unable to wedge PA catheter despite use of wire.  Cardiac output 9.9 L/min, cardiac index 4.99   No significant coronary artery disease.  Continue with management of severe mitral regurgitation.    Assessment / Plan:   56 year old female with severe, symptomatic mitral regurgitation. Her biventricular function is normal. No sig CAD. Does have pulmonary hypertension.   Risks/benefits/alternatives of mitral  valve repair (95% standard recovery, 4% morbidity [any organ], 1% mortality) were discussed at length and she agreed to proceed. No family present at visit. Decision maker would be relative, not mother.   We discussed that chance of repair is over 90% in posterior leaflet pathology. If we did have to replace, we discussed a biologic valve as she has fibroids that bleed regularly. She understands likely repeat intervention in her lifetime, possibly by catheter in the future would be a possibility. Also discussed when we repair around 1% per year may develop MR. This is less likely in a normal sized ventricle.   I typically do repairs mini-invasively. She does have a BMI >40. I will obtain a CTA CAP to evaluate if the femoral vessels are large enough and for operative planning. It may be that she needs it done by sternotomy  via limited skin incision. We will see.   Plan: Obtain CTA CAP, carotid US Plan for MV repair, LAA clip in January  Camaryn Lumbert H Moris Ratchford 01/02/2022 8:15 PM

## 2021-12-31 ENCOUNTER — Other Ambulatory Visit: Payer: Self-pay | Admitting: Cardiothoracic Surgery

## 2021-12-31 DIAGNOSIS — I340 Nonrheumatic mitral (valve) insufficiency: Secondary | ICD-10-CM

## 2022-01-05 ENCOUNTER — Other Ambulatory Visit (HOSPITAL_COMMUNITY): Payer: Self-pay | Admitting: Cardiothoracic Surgery

## 2022-01-05 ENCOUNTER — Other Ambulatory Visit: Payer: Self-pay | Admitting: Cardiothoracic Surgery

## 2022-01-05 DIAGNOSIS — I340 Nonrheumatic mitral (valve) insufficiency: Secondary | ICD-10-CM

## 2022-01-06 ENCOUNTER — Other Ambulatory Visit (HOSPITAL_COMMUNITY): Payer: Commercial Managed Care - HMO

## 2022-01-11 ENCOUNTER — Ambulatory Visit (HOSPITAL_COMMUNITY)
Admission: RE | Admit: 2022-01-11 | Discharge: 2022-01-11 | Disposition: A | Discharge status: Home / Self Care | Payer: Commercial Managed Care - HMO | Source: Ambulatory Visit | Attending: Cardiothoracic Surgery | Admitting: Cardiothoracic Surgery

## 2022-01-11 ENCOUNTER — Encounter (HOSPITAL_COMMUNITY): Payer: Self-pay

## 2022-01-11 DIAGNOSIS — I340 Nonrheumatic mitral (valve) insufficiency: Secondary | ICD-10-CM

## 2022-01-11 DIAGNOSIS — I10 Essential (primary) hypertension: Secondary | ICD-10-CM | POA: Insufficient documentation

## 2022-01-11 DIAGNOSIS — Z01810 Encounter for preprocedural cardiovascular examination: Secondary | ICD-10-CM | POA: Insufficient documentation

## 2022-01-11 MED ORDER — IOHEXOL 350 MG/ML SOLN
100.0000 mL | Freq: Once | INTRAVENOUS | Status: AC | PRN
  Administered 2022-01-11: 100 mL via INTRAVENOUS

## 2022-01-11 NOTE — Progress Notes (Signed)
Pre-MVR ultrasound study completed.   Please see CV Proc for preliminary results.   Darlin Coco, RDMS, RVT

## 2022-01-12 ENCOUNTER — Ambulatory Visit (HOSPITAL_COMMUNITY): Payer: Commercial Managed Care - HMO

## 2022-01-12 ENCOUNTER — Ambulatory Visit (HOSPITAL_COMMUNITY): Admission: RE | Admit: 2022-01-12 | Payer: Commercial Managed Care - HMO | Source: Ambulatory Visit

## 2022-01-17 IMAGING — US US PELVIS COMPLETE WITH TRANSVAGINAL
1 series · 13 of 25 positions shown · non-contrast
Comparison: None

CLINICAL DATA: A 55-year-old female presents with vaginal bleeding
since [REDACTED], potentially perimenopausal, reportedly with irregular
cycle and bleeding.

EXAM:
TRANSABDOMINAL AND TRANSVAGINAL ULTRASOUND OF PELVIS
TECHNIQUE: Both transabdominal and transvaginal ultrasound examinations of the
pelvis were performed. Transabdominal technique was performed for
global imaging of the pelvis including uterus, ovaries, adnexal
regions, and pelvic cul-de-sac. It was necessary to proceed with
endovaginal exam following the transabdominal exam to visualize the
bilateral ovaries and endometrium.

[Series 1: us pelvis (transabdominal only) · 13 of 136 slices shown]
[im 1/136]
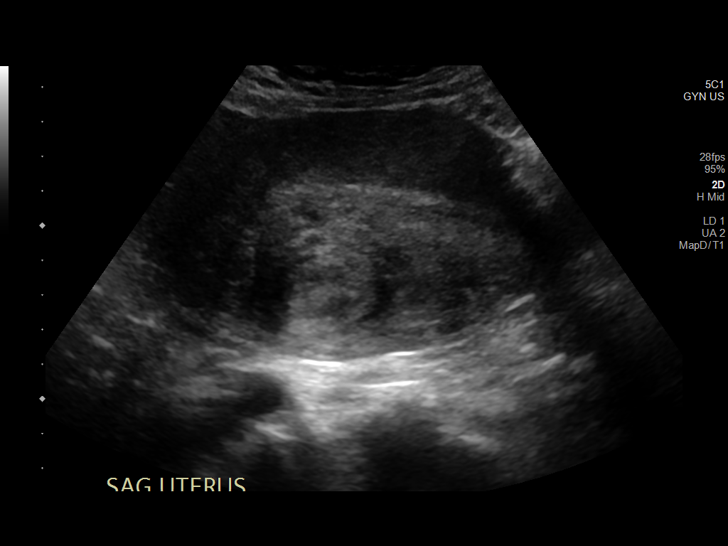
[im 12/136]
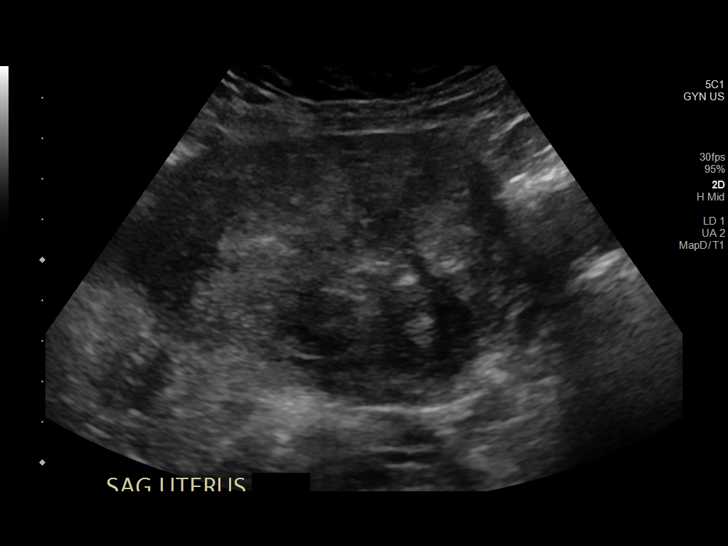
[im 23/136]
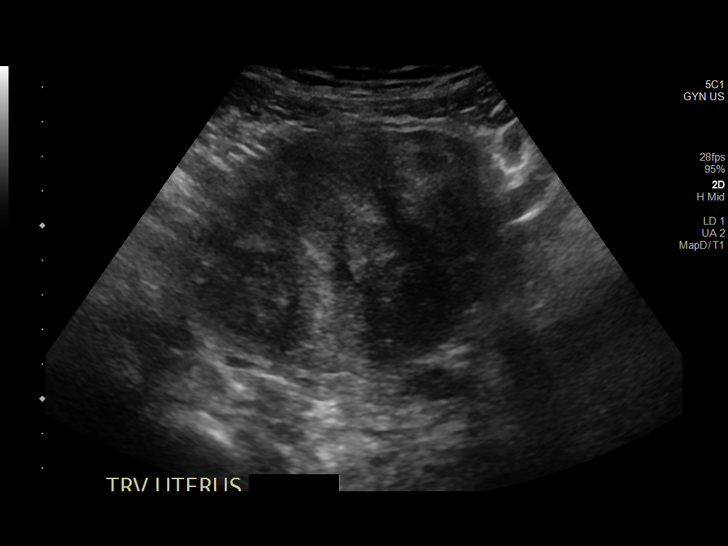
[im 34/136]
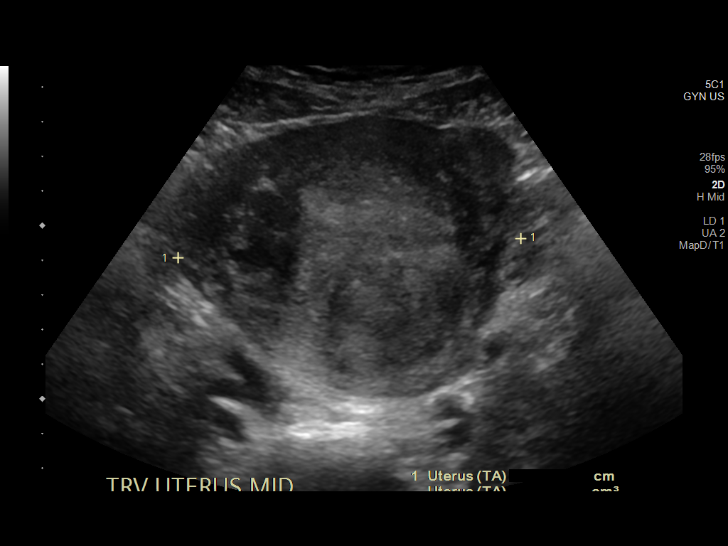
[im 46/136]
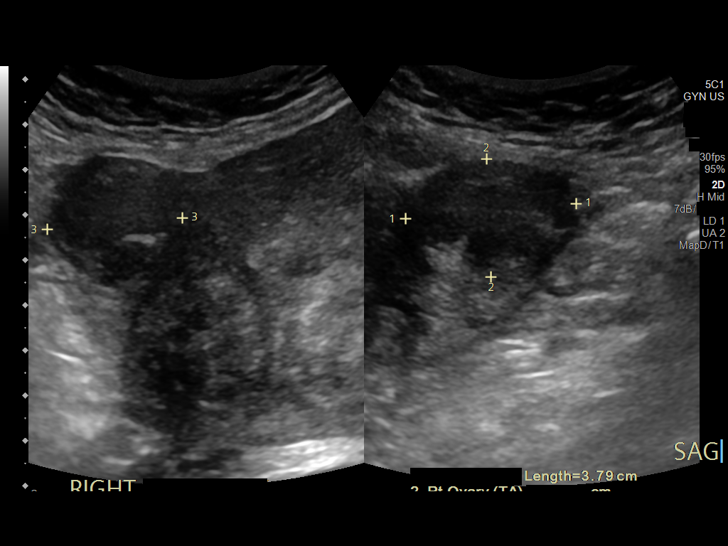
[im 57/136]
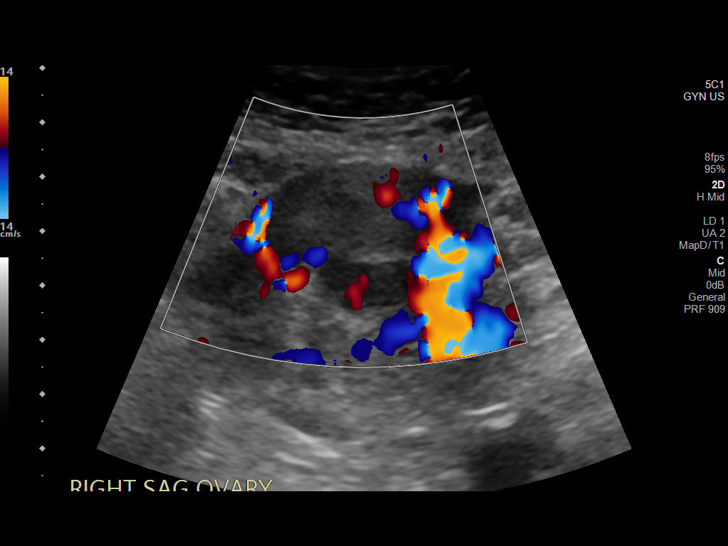
[im 68/136]
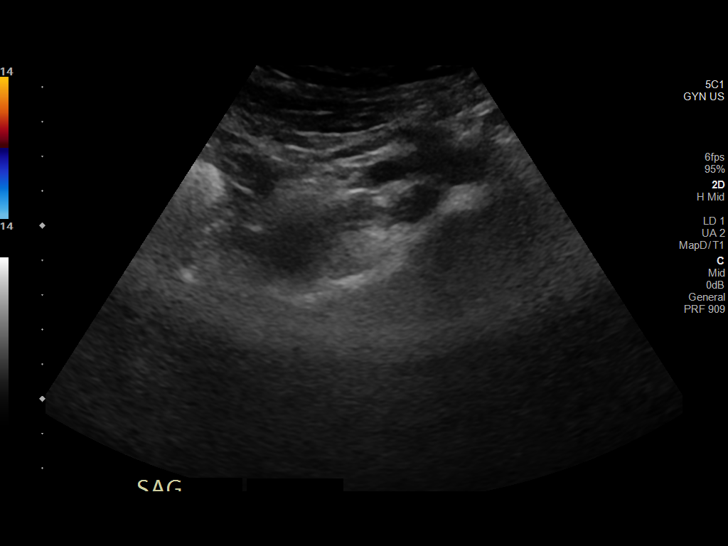
[im 79/136]
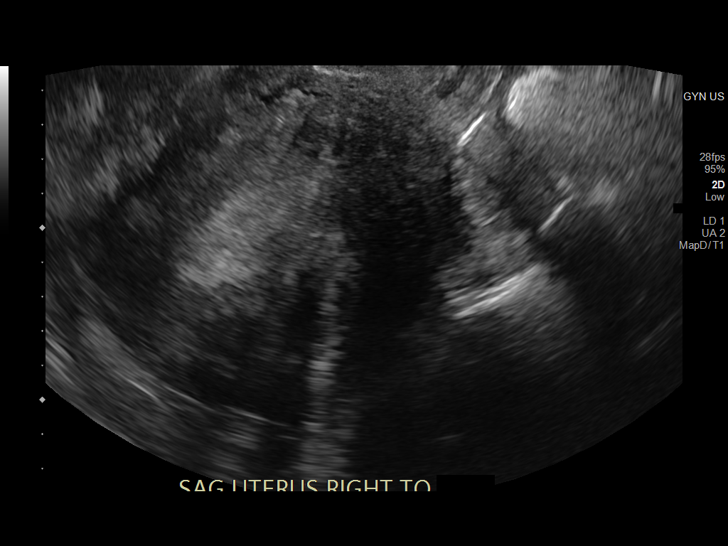
[im 91/136]
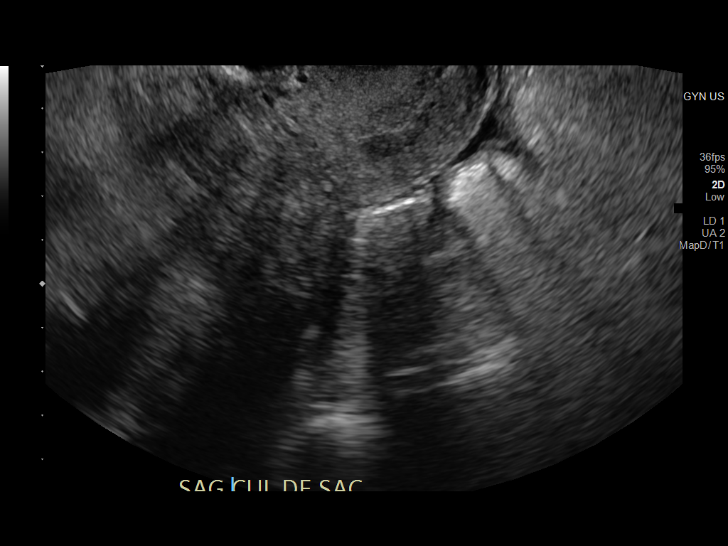
[im 102/136]
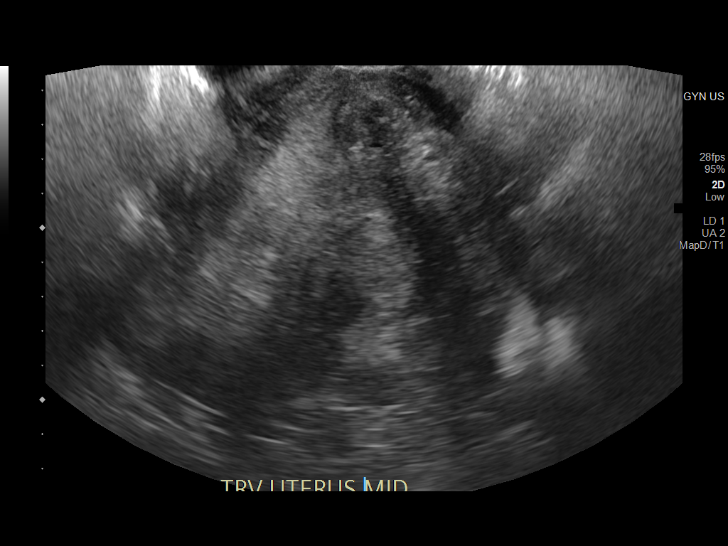
[im 113/136]
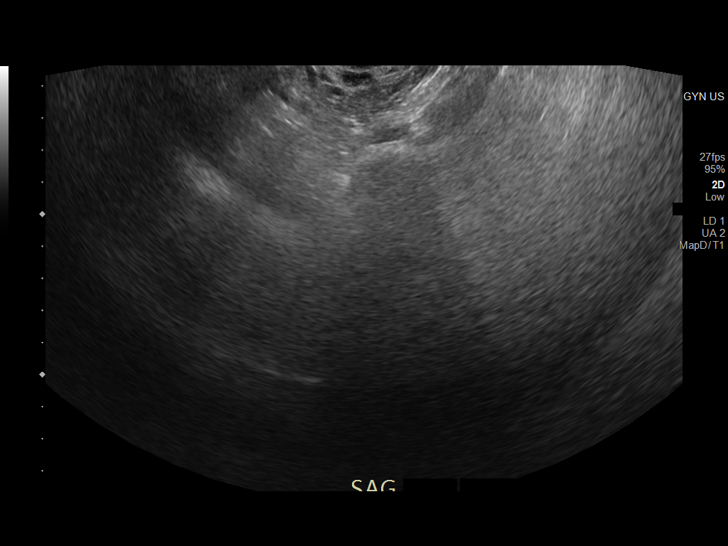
[im 124/136]
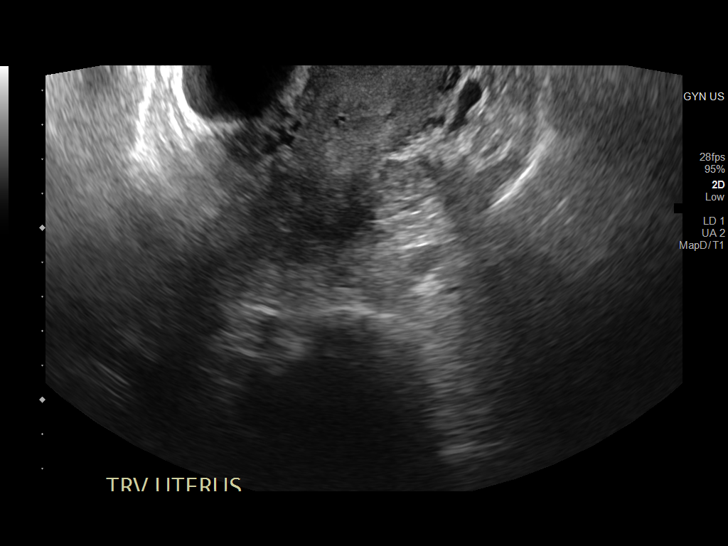
[im 136/136]
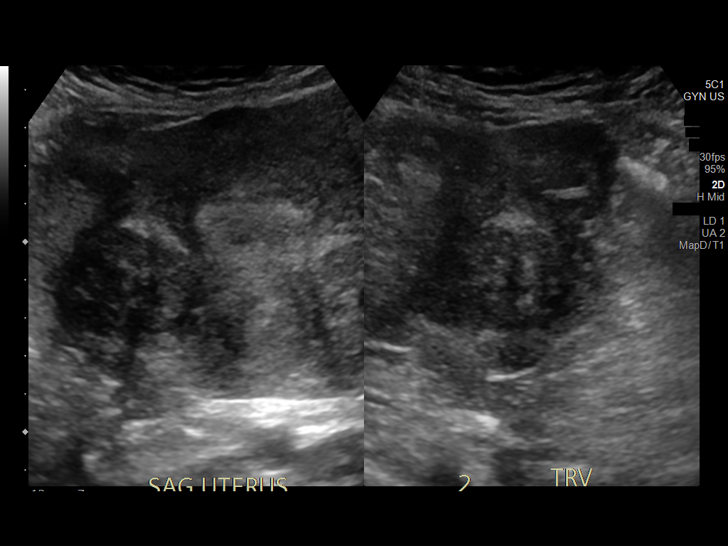

[13 of 25 positions shown; findings below may reference images not displayed]

FINDINGS: Uterus

Measurements: 14.3 x 9.3 x 11.5 cm = volume: 797 mL. Leiomyomas in
the posterior uterus and arising from the uterine fundus. At the
RIGHT uterine fundus a 5.0 x 4.4 x 4.6 cm leiomyoma is demonstrated.
Along the posterior uterine fundus a smaller adjacent leiomyoma is
4.3 x 3.5 x 3.7 cm.

Along the posterior RIGHT lateral uterus is an additional leiomyoma
measuring 3.0 x 2.8 x 3.9 cm. The show posterior acoustic shadowing.

Other smaller leiomyomata are present in the uterus as well,
numerous areas. Some of these obscure full visualization of the
endometrium.

Endometrium

Thickness: 31 mm with more focal area towards the fundal portion of
the endometrium. Some boundaries are slightly indistinct. Small
hypoechoic area in the posterior aspect of the endometrium best seen
on image 88 measuring approximately 8-9 mm.

Right ovary

Measurements: 3.8 x 2.6 x 3.0 = volume: 15.5 mL. Normal
appearance/no adnexal mass.

Left ovary

Measurements: 5.7 x 2.6 x 3.5 = volume: 26.8 mL. Small follicles in
the LEFT ovary.

Other findings

Trace free fluid in the pelvis.
IMPRESSION: Marked thickening of the endometrium in this perimenopausal patient
with small focal area of decreased echogenicity in the midst of
endometrial thickening, based on the pattern endometrial hyperplasia
or even endometrial neoplasm are considered. Suggest gyn
consultation if not yet performed and consider sonohysterogram or
MRI for further evaluation on follow-up. Ultimately sampling may be
warranted particularly of bleeding has remained unresponsive to
hormonal therapy.

Some areas of the endometrium are obscured by numerous leiomyomata
some of these may be submucosal particularly midline posterior
leiomyoma best seen on image 134 in the fundus of the uterus.

## 2022-01-20 ENCOUNTER — Other Ambulatory Visit: Payer: Self-pay

## 2022-01-20 DIAGNOSIS — I340 Nonrheumatic mitral (valve) insufficiency: Secondary | ICD-10-CM

## 2022-02-10 ENCOUNTER — Encounter: Payer: Self-pay | Admitting: Obstetrics and Gynecology

## 2022-02-10 ENCOUNTER — Ambulatory Visit (INDEPENDENT_AMBULATORY_CARE_PROVIDER_SITE_OTHER): Payer: Commercial Managed Care - HMO | Admitting: Obstetrics and Gynecology

## 2022-02-10 VITALS — BP 127/76 | HR 82

## 2022-02-10 DIAGNOSIS — N939 Abnormal uterine and vaginal bleeding, unspecified: Secondary | ICD-10-CM | POA: Diagnosis not present

## 2022-02-10 NOTE — Patient Instructions (Addendum)
Both of these procedures are less invasive and can lessen bleeding.

## 2022-02-10 NOTE — Progress Notes (Signed)
GYNECOLOGY PATIENT Patient name: Melissa Riley MRN [id number]  Date of birth: 1965/12/18 Chief Complaint:   Vaginal Bleeding     History:  Melissa Riley is a 57 y.o. [id number] being seen today for continued bleeding.  Continued vaginal bleeding x1.5 years. Initially erratic, started on megace and bleeding isn't heavy but persistent. Would like to review additional  management options as bleeding is moreseo bothersome at this time.   EMB from 01/2021 benign    Gynecologic History No LMP recorded. (Menstrual status: Other). Contraception: abstinence Last Pap: 01/2021. Result was normal with negative HPV Last Mammogram: 10/2021.  Result was normal  Obstetric History OB History  Gravida Para Term Preterm AB Living  '2 2 2     2  '$ SAB IAB Ectopic Multiple Live Births               # Outcome Date GA Lbr Len/2nd Weight Sex Delivery Anes PTL Lv  2 Term      Vag-Spont     1 Term      Vag-Spont       Past Medical History:  Diagnosis Date   Heart murmur    Hypertension    Mitral regurgitation     Past Surgical History:  Procedure Laterality Date   RIGHT/LEFT HEART CATH AND CORONARY ANGIOGRAPHY N/A 04/05/2021   Procedure: RIGHT/LEFT HEART CATH AND CORONARY ANGIOGRAPHY;  Surgeon: Jettie Booze, MD;  Location: [facility];  Service: Cardiovascular;  Laterality: N/A;   TEE WITHOUT CARDIOVERSION N/A 02/10/2017   Procedure: TRANSESOPHAGEAL ECHOCARDIOGRAM (TEE);  Surgeon: Adrian Prows, MD;  Location: [facility];  Service: Cardiovascular;  Laterality: N/A;   TEE WITHOUT CARDIOVERSION N/A 04/05/2021   Procedure: TRANSESOPHAGEAL ECHOCARDIOGRAM (TEE);  Surgeon: [name], MD;  Location: [facility];  Service: Cardiovascular;  Laterality: N/A;   TUBAL LIGATION      Current Outpatient Medications on File Prior to Visit  Medication Sig Dispense Refill   acetaminophen (TYLENOL) 500 MG tablet Take 1,000 mg by mouth every 6 (six) hours as needed for moderate pain.      amLODipine (NORVASC) 5 MG tablet Take 5 mg by mouth daily.     hydrochlorothiazide (HYDRODIURIL) 25 MG tablet Take 25 mg by mouth daily.     levothyroxine (SYNTHROID) 25 MCG tablet Take 25 mcg by mouth daily.     lisinopril (ZESTRIL) 40 MG tablet Take 1 tablet (40 mg total) by mouth daily. 90 tablet 1   megestrol (MEGACE) 40 MG tablet Take 1 tablet (40 mg total) by mouth 2 (two) times daily. 180 tablet 2   Multiple Vitamins-Minerals (MULTIVITAMIN WITH MINERALS) tablet Take 1 tablet by mouth daily.     No current facility-administered medications on file prior to visit.    Allergies  Allergen Reactions   Amoxil [Amoxicillin] Other (See Comments)    Yeast infection    Social History:  reports that she has never smoked. She has never used smokeless tobacco. She reports that she does not drink alcohol and does not use drugs.  Family History  Problem Relation Age of Onset   Heart disease Mother    Hypertension Mother    Breast cancer Maternal Aunt    Heart disease Maternal Aunt    Breast cancer Cousin     The following portions of the patient's history were reviewed and updated as appropriate: allergies, current medications, past family history, past medical history, past social history, past surgical history and problem list.  Review of  Systems Pertinent items noted in HPI and remainder of comprehensive ROS otherwise negative.  Physical Exam:  BP 127/76   Pulse 82  Physical Exam Vitals and nursing note reviewed.  Constitutional:      Appearance: Normal appearance.  Cardiovascular:     Rate and Rhythm: Normal rate.  Pulmonary:     Effort: Pulmonary effort is normal.     Breath sounds: Normal breath sounds.  Neurological:     General: No focal deficit present.     Mental Status: She is alert and oriented to person, place, and time.  Psychiatric:        Mood and Affect: Mood normal.        Behavior: Behavior normal.        Thought Content: Thought content normal.         Judgment: Judgment normal.        Assessment and Plan:   1. Abnormal uterine bleeding Discussed different management options including continued/increased medication, IUD, [place], and hysterectomy. Would prefer minimal down time as she will be out following heart surgery this Wednesday and can't be out of work for too long again. Will review information further and contact clinic.   Routine preventative health maintenance measures emphasized. Please refer to After Visit Summary for other counseling recommendations.   Follow-up: No follow-ups on file.      Darliss Cheney, MD Obstetrician & Gynecologist, [facility] [employer], [place]

## 2022-02-11 NOTE — Progress Notes (Signed)
Surgical Instructions    Your procedure is scheduled on Wednesday, February 16, 2022.  Report to [facility] Main Entrance "A" at 5:30 A.M., then check in with the Admitting office.  Call this number if you have problems the morning of surgery:  [phone]   If you have any questions prior to your surgery date call [phone]: Open Monday-Friday 8am-4pm If you experience any cold or flu symptoms such as cough, fever, chills, shortness of breath, etc. between now and your scheduled surgery, please notify us at the above number     Remember:  Do not eat or drink after midnight the night before your surgery    Take these medicines the morning of surgery with A SIP OF WATER:  amLODipine (NORVASC)  levothyroxine (SYNTHROID)  megestrol (MEGACE)   As of today, STOP taking any Aspirin (unless otherwise instructed by your surgeon) Aleve, Naproxen, Ibuprofen, Motrin, Advil, Goody's, BC's, all herbal medications, fish oil, and all vitamins.           Do not wear jewelry or makeup. Do not wear lotions, powders, perfumes/cologne or deodorant. Do not shave 48 hours prior to surgery.  Men may shave face and neck. Do not bring valuables to the hospital. Do not wear nail polish, gel polish, artificial nails, or any other type of covering on natural nails (fingers and toes) If you have artificial nails or gel coating that need to be removed by a nail salon, please have this removed prior to surgery. Artificial nails or gel coating may interfere with anesthesia's ability to adequately monitor your vital signs.  [facility] is not responsible for any belongings or valuables.    Do NOT Smoke (Tobacco/Vaping)  24 hours prior to your procedure  If you use a CPAP at night, you may bring your mask for your overnight stay.   Contacts, glasses, hearing aids, dentures or partials may not be worn into surgery, please bring cases for these belongings   For patients admitted to the hospital, discharge time  will be determined by your treatment team.   Patients discharged the day of surgery will not be allowed to drive home, and someone needs to stay with them for 24 hours.   SURGICAL WAITING ROOM VISITATION Patients having surgery or a procedure may have no more than 2 support people in the waiting area - these visitors may rotate.   Children under the age of 51 must have an adult with them who is not the patient. If the patient needs to stay at the hospital during part of their recovery, the visitor guidelines for inpatient rooms apply. Pre-op nurse will coordinate an appropriate time for 1 support person to accompany patient in pre-op.  This support person may not rotate.   Please refer to [email or web address] for the visitor guidelines for Inpatients (after your surgery is over and you are in a regular room).    Special instructions:    Oral Hygiene is also important to reduce your risk of infection.  Remember - BRUSH YOUR TEETH THE MORNING OF SURGERY WITH YOUR REGULAR TOOTHPASTE   - Preparing For Surgery  Before surgery, you can play an important role. Because skin is not sterile, your skin needs to be as free of germs as possible. You can reduce the number of germs on your skin by washing with CHG (chlorahexidine gluconate) Soap before surgery.  CHG is an antiseptic cleaner which kills germs and bonds with the skin to continue killing germs even after washing.  Please do not use if you have an allergy to CHG or antibacterial soaps. If your skin becomes reddened/irritated stop using the CHG.  Do not shave (including legs and underarms) for at least 48 hours prior to first CHG shower. It is OK to shave your face.  Please follow these instructions carefully.     Shower the NIGHT BEFORE SURGERY and the MORNING OF SURGERY with CHG Soap.   If you chose to wash your hair, wash your hair first as usual with your normal shampoo.  After you shampoo, rinse your hair and body thoroughly to remove the shampoo.  Then [employer] and genitals (private parts) with your normal soap and rinse thoroughly to remove soap.  After that Use CHG Soap as you would any other liquid soap. You can apply CHG directly to the skin and wash gently with a scrungie or a clean washcloth.   Apply the CHG Soap to your body ONLY FROM THE NECK DOWN.  Do not use on open wounds or open sores. Avoid contact with your eyes, ears, mouth and genitals (private parts). Wash Face and genitals (private parts)  with your normal soap.   Wash thoroughly, paying special attention to the area where your surgery will be performed.  Thoroughly rinse your body with warm water from the neck down.  DO NOT shower/wash with your normal soap after using and rinsing off the CHG Soap.  Pat yourself dry with a CLEAN TOWEL.  Wear CLEAN PAJAMAS to bed the night before surgery  Place CLEAN SHEETS on your bed the night before your surgery  DO NOT SLEEP WITH PETS.   Day of Surgery:  Take a shower with CHG soap. Wear Clean/Comfortable clothing the morning of surgery Do not apply any deodorants/lotions.   Remember to brush your teeth WITH YOUR REGULAR TOOTHPASTE.    If you received a COVID test during your pre-op visit, it is requested that you wear a mask when out in public, stay away from anyone that may not be feeling well, and notify your surgeon if you develop symptoms. If you have been in contact with anyone that has tested positive in the last 10 days, please notify your surgeon.    Please read over the following fact sheets that you were given.

## 2022-02-14 ENCOUNTER — Encounter (HOSPITAL_COMMUNITY): Payer: Self-pay

## 2022-02-14 ENCOUNTER — Other Ambulatory Visit: Payer: Self-pay

## 2022-02-14 ENCOUNTER — Ambulatory Visit (HOSPITAL_COMMUNITY)
Admission: RE | Admit: 2022-02-14 | Discharge: 2022-02-14 | Disposition: A | Discharge status: Home / Self Care | Payer: Commercial Managed Care - HMO | Source: Ambulatory Visit | Attending: Cardiothoracic Surgery | Admitting: Cardiothoracic Surgery

## 2022-02-14 ENCOUNTER — Encounter (HOSPITAL_COMMUNITY)
Admission: RE | Admit: 2022-02-14 | Discharge: 2022-02-14 | Disposition: A | Discharge status: Home / Self Care | Payer: Commercial Managed Care - HMO | Source: Ambulatory Visit | Attending: Cardiothoracic Surgery | Admitting: Cardiothoracic Surgery

## 2022-02-14 VITALS — BP 149/92 | HR 80 | Temp 98.4°F | Resp 17 | Ht 62.5 in | Wt 228.0 lb

## 2022-02-14 DIAGNOSIS — Z1152 Encounter for screening for COVID-19: Secondary | ICD-10-CM | POA: Insufficient documentation

## 2022-02-14 DIAGNOSIS — Z01818 Encounter for other preprocedural examination: Secondary | ICD-10-CM

## 2022-02-14 DIAGNOSIS — I340 Nonrheumatic mitral (valve) insufficiency: Secondary | ICD-10-CM

## 2022-02-14 HISTORY — DX: Unspecified osteoarthritis, unspecified site: M19.90

## 2022-02-14 HISTORY — DX: Leiomyoma of uterus, unspecified: D25.9

## 2022-02-14 HISTORY — DX: Hypothyroidism, unspecified: E03.9

## 2022-02-14 LAB — BLOOD GAS, ARTERIAL
Acid-Base Excess: 3.6 mmol/L — ABNORMAL HIGH (ref 0.0–2.0)
Bicarbonate: 26.5 mmol/L (ref 20.0–28.0)
Drawn by: 6643
O2 Saturation: 100 %
Patient temperature: 37
pCO2 arterial: 34 mmHg (ref 32–48)
pH, Arterial: 7.5 — ABNORMAL HIGH (ref 7.35–7.45)
pO2, Arterial: 113 mmHg — ABNORMAL HIGH (ref 83–108)

## 2022-02-14 LAB — COMPREHENSIVE METABOLIC PANEL
ALT: 16 U/L (ref 0–44)
AST: 24 U/L (ref 15–41)
Albumin: 4.3 g/dL (ref 3.5–5.0)
Alkaline Phosphatase: 53 U/L (ref 38–126)
Anion gap: 11 (ref 5–15)
BUN: 12 mg/dL (ref 6–20)
CO2: 20 mmol/L — ABNORMAL LOW (ref 22–32)
Calcium: 9.3 mg/dL (ref 8.9–10.3)
Chloride: 108 mmol/L (ref 98–111)
Creatinine, Ser: 0.72 mg/dL (ref 0.44–1.00)
GFR, Estimated: >60 mL/min (ref >60)
Glucose, Bld: 103 mg/dL — ABNORMAL HIGH (ref 70–99)
Potassium: 3.8 mmol/L (ref 3.5–5.1)
Sodium: 139 mmol/L (ref 135–145)
Total Bilirubin: 0.8 mg/dL (ref 0.3–1.2)
Total Protein: 7.2 g/dL (ref 6.5–8.1)

## 2022-02-14 LAB — CBC
HCT: 42.2 % (ref 36.0–46.0)
Hemoglobin: 13.8 g/dL (ref 12.0–15.0)
MCH: 31.5 pg (ref 26.0–34.0)
MCHC: 32.7 g/dL (ref 30.0–36.0)
MCV: 96.3 fL (ref 80.0–100.0)
Platelets: 252 10*3/uL (ref 150–400)
RBC: 4.38 MIL/uL (ref 3.87–5.11)
RDW: 13.2 % (ref 11.5–15.5)
WBC: 7.1 10*3/uL (ref 4.0–10.5)
nRBC: 0 % (ref 0.0–0.2)

## 2022-02-14 LAB — URINALYSIS, ROUTINE W REFLEX MICROSCOPIC
Bilirubin Urine: NEGATIVE
Glucose, UA: NEGATIVE mg/dL
Ketones, ur: NEGATIVE mg/dL
Leukocytes,Ua: NEGATIVE
Nitrite: NEGATIVE
Protein, ur: NEGATIVE mg/dL
Specific Gravity, Urine: 1.015 (ref 1.005–1.030)
pH: 7.5 (ref 5.0–8.0)

## 2022-02-14 LAB — SARS CORONAVIRUS 2 (TAT 6-24 HRS): SARS Coronavirus 2: NEGATIVE

## 2022-02-14 LAB — TYPE AND SCREEN
ABO/RH(D): O POS
Antibody Screen: NEGATIVE

## 2022-02-14 LAB — PROTIME-INR
INR: 1.1 (ref 0.8–1.2)
Prothrombin Time: 13.8 seconds (ref 11.4–15.2)

## 2022-02-14 LAB — APTT: aPTT: 30 seconds (ref 24–36)

## 2022-02-14 LAB — URINALYSIS, MICROSCOPIC (REFLEX): RBC / HPF: >50 RBC/hpf (ref 0–5)

## 2022-02-14 LAB — SURGICAL PCR SCREEN
MRSA, PCR: NEGATIVE
Staphylococcus aureus: POSITIVE — AB

## 2022-02-14 LAB — HEMOGLOBIN A1C
Hgb A1c MFr Bld: 5.2 % (ref 4.8–5.6)
Mean Plasma Glucose: 102.54 mg/dL

## 2022-02-14 NOTE — Progress Notes (Signed)
PCP - Earnestine Leys, FNP Cardiologist - [place]  PPM/ICD - Denies  Chest x-ray - 02/14/2022 EKG - 02/14/2022 Stress Test - Denies ECHO - 02/26/2021 Cardiac Cath - 04/05/2021  Sleep Study - Denies DM: denies   Blood Thinner Instructions: N/A Aspirin Instructions: N/a  ERAS Protcol - NPO PRE-SURGERY Ensure or G2- N/A  COVID TEST-  yes   Anesthesia review: Yes, cardiac history  Patient denies shortness of breath, fever, cough and chest pain at PAT appointment   All instructions explained to the patient, with a verbal understanding of the material. Patient agrees to go over the instructions while at home for a better understanding. The opportunity to ask questions was provided.

## 2022-02-14 NOTE — Progress Notes (Signed)
   02/14/22 0820  OBSTRUCTIVE SLEEP APNEA  Have you ever been diagnosed with sleep apnea through a sleep study? No  Do you snore loudly (loud enough to be heard through closed doors)?  1  Do you often feel tired, fatigued, or sleepy during the daytime (such as falling asleep during driving or talking to someone)? 0  Has anyone observed you stop breathing during your sleep? 1  Do you have, or are you being treated for high blood pressure? 1  BMI more than 35 kg/m2? 1  Age > 50 (1-yes) 1  Neck circumference greater than:Female 16 inches or larger, Female 17inches or larger? 0  Female Gender (Yes=1) 0  Obstructive Sleep Apnea Score 5  Score 5 or greater  Results sent to PCP

## 2022-02-15 MED ORDER — CEFAZOLIN SODIUM-DEXTROSE 2-4 GM/100ML-% IV SOLN
2.0000 g | INTRAVENOUS | Status: AC
  Administered 2022-02-16: 2 g via INTRAVENOUS
  Filled 2022-02-15: qty 100

## 2022-02-15 MED ORDER — VANCOMYCIN HCL 1000 MG IV SOLR
INTRAVENOUS | Status: DC
  Filled 2022-02-15: qty 20

## 2022-02-15 MED ORDER — NOREPINEPHRINE 4 MG/250ML-% IV SOLN
0.0000 ug/min | INTRAVENOUS | Status: AC
  Administered 2022-02-16: 2 ug/min via INTRAVENOUS
  Filled 2022-02-15: qty 250

## 2022-02-15 MED ORDER — PLASMA-LYTE A IV SOLN
INTRAVENOUS | Status: DC
  Filled 2022-02-15: qty 2.5

## 2022-02-15 MED ORDER — VANCOMYCIN HCL 1500 MG/300ML IV SOLN
1500.0000 mg | INTRAVENOUS | Status: AC
  Administered 2022-02-16: 1500 mg via INTRAVENOUS
  Filled 2022-02-15: qty 300

## 2022-02-15 MED ORDER — POTASSIUM CHLORIDE 2 MEQ/ML IV SOLN
80.0000 meq | INTRAVENOUS | Status: DC
  Filled 2022-02-15: qty 40

## 2022-02-15 MED ORDER — PHENYLEPHRINE HCL-NACL 20-0.9 MG/250ML-% IV SOLN
30.0000 ug/min | INTRAVENOUS | Status: AC
  Administered 2022-02-16: 20 ug/min via INTRAVENOUS
  Filled 2022-02-15: qty 250

## 2022-02-15 MED ORDER — HEPARIN 30,000 UNITS/1000 ML (OHS) CELLSAVER SOLUTION
Status: DC
  Filled 2022-02-15: qty 1000

## 2022-02-15 MED ORDER — TRANEXAMIC ACID (OHS) BOLUS VIA INFUSION
15.0000 mg/kg | INTRAVENOUS | Status: AC
  Administered 2022-02-16: 1551 mg via INTRAVENOUS
  Filled 2022-02-15: qty 1551

## 2022-02-15 MED ORDER — MILRINONE LACTATE IN DEXTROSE 20-5 MG/100ML-% IV SOLN
0.3000 ug/kg/min | INTRAVENOUS | Status: DC
  Filled 2022-02-15: qty 100

## 2022-02-15 MED ORDER — MANNITOL 20 % IV SOLN
INTRAVENOUS | Status: DC
  Filled 2022-02-15: qty 13

## 2022-02-15 MED ORDER — DEXMEDETOMIDINE HCL IN NACL 400 MCG/100ML IV SOLN
0.1000 ug/kg/h | INTRAVENOUS | Status: AC
  Administered 2022-02-16: .2 ug/kg/h via INTRAVENOUS
  Filled 2022-02-15: qty 100

## 2022-02-15 MED ORDER — INSULIN REGULAR(HUMAN) IN NACL 100-0.9 UT/100ML-% IV SOLN
INTRAVENOUS | Status: AC
  Administered 2022-02-16: 1.1 [IU]/h via INTRAVENOUS
  Filled 2022-02-15: qty 100

## 2022-02-15 MED ORDER — EPINEPHRINE HCL 5 MG/250ML IV SOLN IN NS
0.0000 ug/min | INTRAVENOUS | Status: AC
  Administered 2022-02-16: 2 ug/min via INTRAVENOUS
  Filled 2022-02-15: qty 250

## 2022-02-15 MED ORDER — TRANEXAMIC ACID 1000 MG/10ML IV SOLN
1.5000 mg/kg/h | INTRAVENOUS | Status: AC
  Administered 2022-02-16: 1.5 mg/kg/h via INTRAVENOUS
  Filled 2022-02-15: qty 25

## 2022-02-15 MED ORDER — TRANEXAMIC ACID (OHS) PUMP PRIME SOLUTION
2.0000 mg/kg | INTRAVENOUS | Status: DC
  Filled 2022-02-15: qty 2.07

## 2022-02-15 MED ORDER — NITROGLYCERIN IN D5W 200-5 MCG/ML-% IV SOLN
2.0000 ug/min | INTRAVENOUS | Status: DC
  Filled 2022-02-15: qty 250

## 2022-02-16 ENCOUNTER — Other Ambulatory Visit: Payer: Self-pay

## 2022-02-16 ENCOUNTER — Encounter (HOSPITAL_COMMUNITY)
Admission: RE | Disposition: A | Discharge status: Home / Self Care | Payer: Self-pay | Source: Home / Self Care | Attending: Cardiothoracic Surgery

## 2022-02-16 ENCOUNTER — Inpatient Hospital Stay (HOSPITAL_COMMUNITY): Payer: Commercial Managed Care - HMO | Admitting: Physician Assistant

## 2022-02-16 ENCOUNTER — Inpatient Hospital Stay (HOSPITAL_COMMUNITY): Payer: Commercial Managed Care - HMO

## 2022-02-16 ENCOUNTER — Other Ambulatory Visit: Payer: Self-pay | Admitting: Cardiology

## 2022-02-16 ENCOUNTER — Encounter (HOSPITAL_COMMUNITY): Payer: Self-pay | Admitting: Cardiothoracic Surgery

## 2022-02-16 ENCOUNTER — Inpatient Hospital Stay (HOSPITAL_COMMUNITY)
Admission: RE | Admit: 2022-02-16 | Discharge: 2022-02-25 | DRG: 220 | Disposition: A | Discharge status: Home / Self Care | Payer: Commercial Managed Care - HMO | Attending: Cardiothoracic Surgery | Admitting: Cardiothoracic Surgery

## 2022-02-16 DIAGNOSIS — Z803 Family history of malignant neoplasm of breast: Secondary | ICD-10-CM

## 2022-02-16 DIAGNOSIS — Z9911 Dependence on respirator [ventilator] status: Secondary | ICD-10-CM

## 2022-02-16 DIAGNOSIS — D259 Leiomyoma of uterus, unspecified: Secondary | ICD-10-CM | POA: Diagnosis present

## 2022-02-16 DIAGNOSIS — Z8249 Family history of ischemic heart disease and other diseases of the circulatory system: Secondary | ICD-10-CM

## 2022-02-16 DIAGNOSIS — Z1152 Encounter for screening for COVID-19: Secondary | ICD-10-CM

## 2022-02-16 DIAGNOSIS — I340 Nonrheumatic mitral (valve) insufficiency: Principal | ICD-10-CM | POA: Diagnosis present

## 2022-02-16 DIAGNOSIS — E039 Hypothyroidism, unspecified: Secondary | ICD-10-CM

## 2022-02-16 DIAGNOSIS — I10 Essential (primary) hypertension: Secondary | ICD-10-CM | POA: Diagnosis present

## 2022-02-16 DIAGNOSIS — E669 Obesity, unspecified: Secondary | ICD-10-CM | POA: Diagnosis present

## 2022-02-16 DIAGNOSIS — D62 Acute posthemorrhagic anemia: Secondary | ICD-10-CM | POA: Diagnosis not present

## 2022-02-16 DIAGNOSIS — I272 Pulmonary hypertension, unspecified: Secondary | ICD-10-CM | POA: Diagnosis present

## 2022-02-16 DIAGNOSIS — Z6841 Body Mass Index (BMI) 40.0 and over, adult: Secondary | ICD-10-CM | POA: Diagnosis not present

## 2022-02-16 DIAGNOSIS — J9600 Acute respiratory failure, unspecified whether with hypoxia or hypercapnia: Secondary | ICD-10-CM

## 2022-02-16 DIAGNOSIS — I454 Nonspecific intraventricular block: Secondary | ICD-10-CM | POA: Diagnosis present

## 2022-02-16 DIAGNOSIS — Z79890 Hormone replacement therapy (postmenopausal): Secondary | ICD-10-CM

## 2022-02-16 DIAGNOSIS — I480 Paroxysmal atrial fibrillation: Secondary | ICD-10-CM | POA: Diagnosis present

## 2022-02-16 DIAGNOSIS — Z9889 Other specified postprocedural states: Secondary | ICD-10-CM

## 2022-02-16 DIAGNOSIS — M1990 Unspecified osteoarthritis, unspecified site: Secondary | ICD-10-CM

## 2022-02-16 DIAGNOSIS — Z79899 Other long term (current) drug therapy: Secondary | ICD-10-CM | POA: Diagnosis not present

## 2022-02-16 DIAGNOSIS — J9811 Atelectasis: Secondary | ICD-10-CM | POA: Diagnosis not present

## 2022-02-16 DIAGNOSIS — D509 Iron deficiency anemia, unspecified: Secondary | ICD-10-CM | POA: Diagnosis present

## 2022-02-16 DIAGNOSIS — D696 Thrombocytopenia, unspecified: Secondary | ICD-10-CM | POA: Diagnosis not present

## 2022-02-16 DIAGNOSIS — R739 Hyperglycemia, unspecified: Secondary | ICD-10-CM | POA: Diagnosis not present

## 2022-02-16 DIAGNOSIS — Z880 Allergy status to penicillin: Secondary | ICD-10-CM | POA: Diagnosis not present

## 2022-02-16 DIAGNOSIS — I4891 Unspecified atrial fibrillation: Secondary | ICD-10-CM

## 2022-02-16 HISTORY — PX: TEE WITHOUT CARDIOVERSION: SHX5443

## 2022-02-16 HISTORY — PX: MITRAL VALVE REPAIR: SHX2039

## 2022-02-16 LAB — POCT I-STAT 7, (LYTES, BLD GAS, ICA,H+H)
Acid-base deficit: 4 mmol/L — ABNORMAL HIGH (ref 0.0–2.0)
Acid-base deficit: 2 mmol/L (ref 0.0–2.0)
Acid-base deficit: 2 mmol/L (ref 0.0–2.0)
Acid-base deficit: 3 mmol/L — ABNORMAL HIGH (ref 0.0–2.0)
Acid-base deficit: 5 mmol/L — ABNORMAL HIGH (ref 0.0–2.0)
Acid-base deficit: 7 mmol/L — ABNORMAL HIGH (ref 0.0–2.0)
Acid-base deficit: 7 mmol/L — ABNORMAL HIGH (ref 0.0–2.0)
Acid-base deficit: 6 mmol/L — ABNORMAL HIGH (ref 0.0–2.0)
Bicarbonate: 20.5 mmol/L (ref 20.0–28.0)
Bicarbonate: 21.9 mmol/L (ref 20.0–28.0)
Bicarbonate: 22.7 mmol/L (ref 20.0–28.0)
Bicarbonate: 22.2 mmol/L (ref 20.0–28.0)
Bicarbonate: 19.6 mmol/L — ABNORMAL LOW (ref 20.0–28.0)
Bicarbonate: 19.8 mmol/L — ABNORMAL LOW (ref 20.0–28.0)
Bicarbonate: 17 mmol/L — ABNORMAL LOW (ref 20.0–28.0)
Bicarbonate: 17.6 mmol/L — ABNORMAL LOW (ref 20.0–28.0)
Calcium, Ion: 1.27 mmol/L (ref 1.15–1.40)
Calcium, Ion: 0.97 mmol/L — ABNORMAL LOW (ref 1.15–1.40)
Calcium, Ion: 1.11 mmol/L — ABNORMAL LOW (ref 1.15–1.40)
Calcium, Ion: 1.08 mmol/L — ABNORMAL LOW (ref 1.15–1.40)
Calcium, Ion: 1.06 mmol/L — ABNORMAL LOW (ref 1.15–1.40)
Calcium, Ion: 1.09 mmol/L — ABNORMAL LOW (ref 1.15–1.40)
Calcium, Ion: 1.06 mmol/L — ABNORMAL LOW (ref 1.15–1.40)
Calcium, Ion: 1.07 mmol/L — ABNORMAL LOW (ref 1.15–1.40)
HCT: 40 % (ref 36.0–46.0)
HCT: 27 % — ABNORMAL LOW (ref 36.0–46.0)
HCT: 30 % — ABNORMAL LOW (ref 36.0–46.0)
HCT: 26 % — ABNORMAL LOW (ref 36.0–46.0)
HCT: 29 % — ABNORMAL LOW (ref 36.0–46.0)
HCT: 33 % — ABNORMAL LOW (ref 36.0–46.0)
HCT: 29 % — ABNORMAL LOW (ref 36.0–46.0)
HCT: 28 % — ABNORMAL LOW (ref 36.0–46.0)
Hemoglobin: 13.6 g/dL (ref 12.0–15.0)
Hemoglobin: 9.2 g/dL — ABNORMAL LOW (ref 12.0–15.0)
Hemoglobin: 10.2 g/dL — ABNORMAL LOW (ref 12.0–15.0)
Hemoglobin: 8.8 g/dL — ABNORMAL LOW (ref 12.0–15.0)
Hemoglobin: 9.9 g/dL — ABNORMAL LOW (ref 12.0–15.0)
Hemoglobin: 11.2 g/dL — ABNORMAL LOW (ref 12.0–15.0)
Hemoglobin: 9.9 g/dL — ABNORMAL LOW (ref 12.0–15.0)
Hemoglobin: 9.5 g/dL — ABNORMAL LOW (ref 12.0–15.0)
O2 Saturation: 100 %
O2 Saturation: 100 %
O2 Saturation: 100 %
O2 Saturation: 100 %
O2 Saturation: 100 %
O2 Saturation: 98 %
O2 Saturation: 99 %
O2 Saturation: 98 %
Patient temperature: 35.2
Patient temperature: 36.6
Patient temperature: 36.8
Potassium: 3.3 mmol/L — ABNORMAL LOW (ref 3.5–5.1)
Potassium: 3.3 mmol/L — ABNORMAL LOW (ref 3.5–5.1)
Potassium: 3.9 mmol/L (ref 3.5–5.1)
Potassium: 4 mmol/L (ref 3.5–5.1)
Potassium: 3.6 mmol/L (ref 3.5–5.1)
Potassium: 3.4 mmol/L — ABNORMAL LOW (ref 3.5–5.1)
Potassium: 4.3 mmol/L (ref 3.5–5.1)
Potassium: 3.9 mmol/L (ref 3.5–5.1)
Sodium: 141 mmol/L (ref 135–145)
Sodium: 141 mmol/L (ref 135–145)
Sodium: 139 mmol/L (ref 135–145)
Sodium: 140 mmol/L (ref 135–145)
Sodium: 141 mmol/L (ref 135–145)
Sodium: 141 mmol/L (ref 135–145)
Sodium: 140 mmol/L (ref 135–145)
Sodium: 141 mmol/L (ref 135–145)
TCO2: 21 mmol/L — ABNORMAL LOW (ref 22–32)
TCO2: 23 mmol/L (ref 22–32)
TCO2: 24 mmol/L (ref 22–32)
TCO2: 23 mmol/L (ref 22–32)
TCO2: 21 mmol/L — ABNORMAL LOW (ref 22–32)
TCO2: 21 mmol/L — ABNORMAL LOW (ref 22–32)
TCO2: 18 mmol/L — ABNORMAL LOW (ref 22–32)
TCO2: 19 mmol/L — ABNORMAL LOW (ref 22–32)
pCO2 arterial: 34.1 mmHg (ref 32–48)
pCO2 arterial: 34.1 mmHg (ref 32–48)
pCO2 arterial: 39.5 mmHg (ref 32–48)
pCO2 arterial: 39.6 mmHg (ref 32–48)
pCO2 arterial: 32.9 mmHg (ref 32–48)
pCO2 arterial: 42.2 mmHg (ref 32–48)
pCO2 arterial: 29.5 mmHg — ABNORMAL LOW (ref 32–48)
pCO2 arterial: 28.6 mmHg — ABNORMAL LOW (ref 32–48)
pH, Arterial: 7.386 (ref 7.35–7.45)
pH, Arterial: 7.417 (ref 7.35–7.45)
pH, Arterial: 7.367 (ref 7.35–7.45)
pH, Arterial: 7.356 (ref 7.35–7.45)
pH, Arterial: 7.383 (ref 7.35–7.45)
pH, Arterial: 7.27 — ABNORMAL LOW (ref 7.35–7.45)
pH, Arterial: 7.367 (ref 7.35–7.45)
pH, Arterial: 7.398 (ref 7.35–7.45)
pO2, Arterial: 456 mmHg — ABNORMAL HIGH (ref 83–108)
pO2, Arterial: 423 mmHg — ABNORMAL HIGH (ref 83–108)
pO2, Arterial: 430 mmHg — ABNORMAL HIGH (ref 83–108)
pO2, Arterial: 367 mmHg — ABNORMAL HIGH (ref 83–108)
pO2, Arterial: 197 mmHg — ABNORMAL HIGH (ref 83–108)
pO2, Arterial: 104 mmHg (ref 83–108)
pO2, Arterial: 132 mmHg — ABNORMAL HIGH (ref 83–108)
pO2, Arterial: 98 mmHg (ref 83–108)

## 2022-02-16 LAB — BASIC METABOLIC PANEL
Anion gap: 12 (ref 5–15)
BUN: 13 mg/dL (ref 6–20)
CO2: 19 mmol/L — ABNORMAL LOW (ref 22–32)
Calcium: 7.3 mg/dL — ABNORMAL LOW (ref 8.9–10.3)
Chloride: 109 mmol/L (ref 98–111)
Creatinine, Ser: 0.82 mg/dL (ref 0.44–1.00)
GFR, Estimated: >60 mL/min (ref >60)
Glucose, Bld: 141 mg/dL — ABNORMAL HIGH (ref 70–99)
Potassium: 3.9 mmol/L (ref 3.5–5.1)
Sodium: 140 mmol/L (ref 135–145)

## 2022-02-16 LAB — POCT I-STAT, CHEM 8
BUN: 17 mg/dL (ref 6–20)
BUN: 17 mg/dL (ref 6–20)
BUN: 16 mg/dL (ref 6–20)
BUN: 15 mg/dL (ref 6–20)
BUN: 15 mg/dL (ref 6–20)
BUN: 16 mg/dL (ref 6–20)
Calcium, Ion: 1.29 mmol/L (ref 1.15–1.40)
Calcium, Ion: 1.22 mmol/L (ref 1.15–1.40)
Calcium, Ion: 1.12 mmol/L — ABNORMAL LOW (ref 1.15–1.40)
Calcium, Ion: 1.09 mmol/L — ABNORMAL LOW (ref 1.15–1.40)
Calcium, Ion: 1.03 mmol/L — ABNORMAL LOW (ref 1.15–1.40)
Calcium, Ion: 1.03 mmol/L — ABNORMAL LOW (ref 1.15–1.40)
Chloride: 104 mmol/L (ref 98–111)
Chloride: 105 mmol/L (ref 98–111)
Chloride: 103 mmol/L (ref 98–111)
Chloride: 103 mmol/L (ref 98–111)
Chloride: 105 mmol/L (ref 98–111)
Chloride: 105 mmol/L (ref 98–111)
Creatinine, Ser: 0.5 mg/dL (ref 0.44–1.00)
Creatinine, Ser: 0.5 mg/dL (ref 0.44–1.00)
Creatinine, Ser: 0.5 mg/dL (ref 0.44–1.00)
Creatinine, Ser: 0.6 mg/dL (ref 0.44–1.00)
Creatinine, Ser: 0.5 mg/dL (ref 0.44–1.00)
Creatinine, Ser: 0.5 mg/dL (ref 0.44–1.00)
Glucose, Bld: 113 mg/dL — ABNORMAL HIGH (ref 70–99)
Glucose, Bld: 122 mg/dL — ABNORMAL HIGH (ref 70–99)
Glucose, Bld: 115 mg/dL — ABNORMAL HIGH (ref 70–99)
Glucose, Bld: 127 mg/dL — ABNORMAL HIGH (ref 70–99)
Glucose, Bld: 119 mg/dL — ABNORMAL HIGH (ref 70–99)
Glucose, Bld: 153 mg/dL — ABNORMAL HIGH (ref 70–99)
HCT: 39 % (ref 36.0–46.0)
HCT: 34 % — ABNORMAL LOW (ref 36.0–46.0)
HCT: 28 % — ABNORMAL LOW (ref 36.0–46.0)
HCT: 28 % — ABNORMAL LOW (ref 36.0–46.0)
HCT: 26 % — ABNORMAL LOW (ref 36.0–46.0)
HCT: 27 % — ABNORMAL LOW (ref 36.0–46.0)
Hemoglobin: 13.3 g/dL (ref 12.0–15.0)
Hemoglobin: 11.6 g/dL — ABNORMAL LOW (ref 12.0–15.0)
Hemoglobin: 9.5 g/dL — ABNORMAL LOW (ref 12.0–15.0)
Hemoglobin: 9.5 g/dL — ABNORMAL LOW (ref 12.0–15.0)
Hemoglobin: 8.8 g/dL — ABNORMAL LOW (ref 12.0–15.0)
Hemoglobin: 9.2 g/dL — ABNORMAL LOW (ref 12.0–15.0)
Potassium: 3.4 mmol/L — ABNORMAL LOW (ref 3.5–5.1)
Potassium: 3.1 mmol/L — ABNORMAL LOW (ref 3.5–5.1)
Potassium: 3.9 mmol/L (ref 3.5–5.1)
Potassium: 4 mmol/L (ref 3.5–5.1)
Potassium: 3.6 mmol/L (ref 3.5–5.1)
Potassium: 4.2 mmol/L (ref 3.5–5.1)
Sodium: 142 mmol/L (ref 135–145)
Sodium: 140 mmol/L (ref 135–145)
Sodium: 138 mmol/L (ref 135–145)
Sodium: 143 mmol/L (ref 135–145)
Sodium: 140 mmol/L (ref 135–145)
Sodium: 140 mmol/L (ref 135–145)
TCO2: 24 mmol/L (ref 22–32)
TCO2: 24 mmol/L (ref 22–32)
TCO2: 27 mmol/L (ref 22–32)
TCO2: 24 mmol/L (ref 22–32)
TCO2: 22 mmol/L (ref 22–32)
TCO2: 24 mmol/L (ref 22–32)

## 2022-02-16 LAB — CBC
HCT: 33.8 % — ABNORMAL LOW (ref 36.0–46.0)
HCT: 30.4 % — ABNORMAL LOW (ref 36.0–46.0)
Hemoglobin: 10.9 g/dL — ABNORMAL LOW (ref 12.0–15.0)
Hemoglobin: 10.5 g/dL — ABNORMAL LOW (ref 12.0–15.0)
MCH: 31.9 pg (ref 26.0–34.0)
MCH: 33.2 pg (ref 26.0–34.0)
MCHC: 32.2 g/dL (ref 30.0–36.0)
MCHC: 34.5 g/dL (ref 30.0–36.0)
MCV: 98.8 fL (ref 80.0–100.0)
MCV: 96.2 fL (ref 80.0–100.0)
Platelets: 142 10*3/uL — ABNORMAL LOW (ref 150–400)
Platelets: 116 10*3/uL — ABNORMAL LOW (ref 150–400)
RBC: 3.42 MIL/uL — ABNORMAL LOW (ref 3.87–5.11)
RBC: 3.16 MIL/uL — ABNORMAL LOW (ref 3.87–5.11)
RDW: 13 % (ref 11.5–15.5)
RDW: 13 % (ref 11.5–15.5)
WBC: 21.1 10*3/uL — ABNORMAL HIGH (ref 4.0–10.5)
WBC: 15.8 10*3/uL — ABNORMAL HIGH (ref 4.0–10.5)
nRBC: 0 % (ref 0.0–0.2)
nRBC: 0 % (ref 0.0–0.2)

## 2022-02-16 LAB — POCT I-STAT EG7
Acid-Base Excess: 15 mmol/L — ABNORMAL HIGH (ref 0.0–2.0)
Bicarbonate: 41.1 mmol/L — ABNORMAL HIGH (ref 20.0–28.0)
Calcium, Ion: 0.97 mmol/L — ABNORMAL LOW (ref 1.15–1.40)
HCT: 27 % — ABNORMAL LOW (ref 36.0–46.0)
Hemoglobin: 9.2 g/dL — ABNORMAL LOW (ref 12.0–15.0)
O2 Saturation: 84 %
Potassium: 4.1 mmol/L (ref 3.5–5.1)
Sodium: 152 mmol/L — ABNORMAL HIGH (ref 135–145)
TCO2: 43 mmol/L — ABNORMAL HIGH (ref 22–32)
pCO2, Ven: 58.8 mmHg (ref 44–60)
pH, Ven: 7.452 — ABNORMAL HIGH (ref 7.25–7.43)
pO2, Ven: 48 mmHg — ABNORMAL HIGH (ref 32–45)

## 2022-02-16 LAB — GLUCOSE, CAPILLARY
Glucose-Capillary: 157 mg/dL — ABNORMAL HIGH (ref 70–99)
Glucose-Capillary: 161 mg/dL — ABNORMAL HIGH (ref 70–99)
Glucose-Capillary: 158 mg/dL — ABNORMAL HIGH (ref 70–99)
Glucose-Capillary: 156 mg/dL — ABNORMAL HIGH (ref 70–99)
Glucose-Capillary: 143 mg/dL — ABNORMAL HIGH (ref 70–99)
Glucose-Capillary: 159 mg/dL — ABNORMAL HIGH (ref 70–99)
Glucose-Capillary: 157 mg/dL — ABNORMAL HIGH (ref 70–99)

## 2022-02-16 LAB — HEMOGLOBIN AND HEMATOCRIT, BLOOD
HCT: 25.9 % — ABNORMAL LOW (ref 36.0–46.0)
Hemoglobin: 8.8 g/dL — ABNORMAL LOW (ref 12.0–15.0)

## 2022-02-16 LAB — PROTIME-INR
INR: 1.6 — ABNORMAL HIGH (ref 0.8–1.2)
Prothrombin Time: 18.4 seconds — ABNORMAL HIGH (ref 11.4–15.2)

## 2022-02-16 LAB — ABO/RH: ABO/RH(D): O POS

## 2022-02-16 LAB — MAGNESIUM: Magnesium: 3.3 mg/dL — ABNORMAL HIGH (ref 1.7–2.4)

## 2022-02-16 LAB — APTT: aPTT: 26 seconds (ref 24–36)

## 2022-02-16 LAB — PLATELET COUNT: Platelets: 144 10*3/uL — ABNORMAL LOW (ref 150–400)

## 2022-02-16 SURGERY — REPAIR, MITRAL VALVE
Anesthesia: General | Site: Chest

## 2022-02-16 MED ORDER — CEFAZOLIN SODIUM-DEXTROSE 2-4 GM/100ML-% IV SOLN
2.0000 g | Freq: Three times a day (TID) | INTRAVENOUS | Status: AC
  Administered 2022-02-16 – 2022-02-18 (×6): 2 g via INTRAVENOUS
  Filled 2022-02-16 (×4): qty 100

## 2022-02-16 MED ORDER — ORAL CARE MOUTH RINSE
15.0000 mL | OROMUCOSAL | Status: DC
  Administered 2022-02-17 – 2022-02-18 (×7): 15 mL via OROMUCOSAL

## 2022-02-16 MED ORDER — MIDAZOLAM HCL (PF) 10 MG/2ML IJ SOLN
INTRAMUSCULAR | Status: AC
  Filled 2022-02-16: qty 2

## 2022-02-16 MED ORDER — SODIUM CHLORIDE (PF) 0.9 % IJ SOLN
INTRAMUSCULAR | Status: AC
  Filled 2022-02-16: qty 10

## 2022-02-16 MED ORDER — BUPIVACAINE HCL (PF) 0.5 % IJ SOLN
INTRAMUSCULAR | Status: AC
  Filled 2022-02-16: qty 30

## 2022-02-16 MED ORDER — SODIUM CHLORIDE 0.9 % IV SOLN: INTRAVENOUS | Status: DC

## 2022-02-16 MED ORDER — MIDAZOLAM HCL 2 MG/2ML IJ SOLN
2.0000 mg | INTRAMUSCULAR | Status: DC | PRN
  Administered 2022-02-16: 2 mg via INTRAVENOUS
  Filled 2022-02-16: qty 2

## 2022-02-16 MED ORDER — EPINEPHRINE HCL 5 MG/250ML IV SOLN IN NS: 0.0000 ug/min | INTRAVENOUS | Status: DC

## 2022-02-16 MED ORDER — MAGNESIUM SULFATE 4 GM/100ML IV SOLN
4.0000 g | Freq: Once | INTRAVENOUS | Status: AC
  Administered 2022-02-16: 4 g via INTRAVENOUS
  Filled 2022-02-16: qty 100

## 2022-02-16 MED ORDER — PROTAMINE SULFATE 10 MG/ML IV SOLN
INTRAVENOUS | Status: AC
  Filled 2022-02-16: qty 50

## 2022-02-16 MED ORDER — ACETAMINOPHEN 160 MG/5ML PO SOLN: 1000.0000 mg | Freq: Four times a day (QID) | ORAL | Status: AC

## 2022-02-16 MED ORDER — PLASMA-LYTE A IV SOLN: INTRAVENOUS | Status: DC | PRN

## 2022-02-16 MED ORDER — CHLORHEXIDINE GLUCONATE 0.12 % MT SOLN: 15.0000 mL | Freq: Once | OROMUCOSAL | Status: AC

## 2022-02-16 MED ORDER — POTASSIUM CHLORIDE 10 MEQ/50ML IV SOLN
10.0000 meq | INTRAVENOUS | Status: AC
  Administered 2022-02-16 (×3): 10 meq via INTRAVENOUS

## 2022-02-16 MED ORDER — LACTATED RINGERS IV SOLN: INTRAVENOUS | Status: DC

## 2022-02-16 MED ORDER — PROPOFOL 10 MG/ML IV BOLUS
INTRAVENOUS | Status: DC | PRN
  Administered 2022-02-16 (×2): 50 mg via INTRAVENOUS

## 2022-02-16 MED ORDER — MIDAZOLAM HCL 2 MG/2ML IJ SOLN
INTRAMUSCULAR | Status: AC
  Filled 2022-02-16: qty 2

## 2022-02-16 MED ORDER — LACTATED RINGERS IV SOLN: 500.0000 mL | Freq: Once | INTRAVENOUS | Status: DC | PRN

## 2022-02-16 MED ORDER — ACETAMINOPHEN 160 MG/5ML PO SOLN: 650.0000 mg | Freq: Once | ORAL | Status: AC

## 2022-02-16 MED ORDER — BISACODYL 5 MG PO TBEC
10.0000 mg | DELAYED_RELEASE_TABLET | Freq: Every day | ORAL | Status: DC
  Administered 2022-02-17 – 2022-02-19 (×3): 10 mg via ORAL
  Filled 2022-02-16 (×7): qty 2

## 2022-02-16 MED ORDER — MUPIROCIN 2 % EX OINT
1.0000 | TOPICAL_OINTMENT | Freq: Two times a day (BID) | CUTANEOUS | Status: AC
  Administered 2022-02-16 – 2022-02-21 (×10): 1 via NASAL
  Filled 2022-02-16: qty 22

## 2022-02-16 MED ORDER — SODIUM CHLORIDE 0.45 % IV SOLN: INTRAVENOUS | Status: DC | PRN

## 2022-02-16 MED ORDER — METOPROLOL TARTRATE 25 MG/10 ML ORAL SUSPENSION: 12.5000 mg | Freq: Two times a day (BID) | ORAL | Status: DC

## 2022-02-16 MED ORDER — ASPIRIN 81 MG PO CHEW
324.0000 mg | CHEWABLE_TABLET | Freq: Every day | ORAL | Status: DC
  Filled 2022-02-16: qty 4

## 2022-02-16 MED ORDER — NITROGLYCERIN IN D5W 200-5 MCG/ML-% IV SOLN: 0.0000 ug/min | INTRAVENOUS | Status: DC

## 2022-02-16 MED ORDER — ROCURONIUM BROMIDE 10 MG/ML (PF) SYRINGE
PREFILLED_SYRINGE | INTRAVENOUS | Status: AC
  Filled 2022-02-16: qty 30

## 2022-02-16 MED ORDER — BISACODYL 10 MG RE SUPP: 10.0000 mg | Freq: Every day | RECTAL | Status: DC

## 2022-02-16 MED ORDER — VANCOMYCIN HCL 1000 MG IV SOLR: INTRAVENOUS | Status: DC | PRN

## 2022-02-16 MED ORDER — SODIUM CHLORIDE 0.9% FLUSH: 3.0000 mL | INTRAVENOUS | Status: DC | PRN

## 2022-02-16 MED ORDER — ORAL CARE MOUTH RINSE: 15.0000 mL | OROMUCOSAL | Status: DC | PRN

## 2022-02-16 MED ORDER — FAMOTIDINE IN NACL 20-0.9 MG/50ML-% IV SOLN
20.0000 mg | Freq: Two times a day (BID) | INTRAVENOUS | Status: DC
  Administered 2022-02-16: 20 mg via INTRAVENOUS
  Filled 2022-02-16: qty 50

## 2022-02-16 MED ORDER — HEPARIN SODIUM (PORCINE) 1000 UNIT/ML IJ SOLN
INTRAMUSCULAR | Status: DC | PRN
  Administered 2022-02-16: 42000 [IU] via INTRAVENOUS

## 2022-02-16 MED ORDER — VANCOMYCIN HCL IN DEXTROSE 1-5 GM/200ML-% IV SOLN
1000.0000 mg | Freq: Once | INTRAVENOUS | Status: AC
  Administered 2022-02-16: 1000 mg via INTRAVENOUS
  Filled 2022-02-16: qty 200

## 2022-02-16 MED ORDER — CHLORHEXIDINE GLUCONATE 0.12 % MT SOLN: 15.0000 mL | Freq: Once | OROMUCOSAL | Status: DC

## 2022-02-16 MED ORDER — PHENYLEPHRINE 80 MCG/ML (10ML) SYRINGE FOR IV PUSH (FOR BLOOD PRESSURE SUPPORT)
PREFILLED_SYRINGE | INTRAVENOUS | Status: AC
  Filled 2022-02-16: qty 10

## 2022-02-16 MED ORDER — ORAL CARE MOUTH RINSE
15.0000 mL | OROMUCOSAL | Status: DC
  Administered 2022-02-16 (×3): 15 mL via OROMUCOSAL

## 2022-02-16 MED ORDER — LACTATED RINGERS IV SOLN: INTRAVENOUS | Status: DC | PRN

## 2022-02-16 MED ORDER — FENTANYL CITRATE (PF) 250 MCG/5ML IJ SOLN
INTRAMUSCULAR | Status: AC
  Filled 2022-02-16: qty 5

## 2022-02-16 MED ORDER — SODIUM CHLORIDE 0.9 % IV SOLN: 250.0000 mL | INTRAVENOUS | Status: DC

## 2022-02-16 MED ORDER — DEXTROSE 50 % IV SOLN: 0.0000 mL | INTRAVENOUS | Status: DC | PRN

## 2022-02-16 MED ORDER — BUPIVACAINE LIPOSOME 1.3 % IJ SUSP
INTRAMUSCULAR | Status: AC
  Filled 2022-02-16: qty 20

## 2022-02-16 MED ORDER — CHLORHEXIDINE GLUCONATE 4 % EX LIQD: 30.0000 mL | CUTANEOUS | Status: DC

## 2022-02-16 MED ORDER — DEXMEDETOMIDINE HCL IN NACL 400 MCG/100ML IV SOLN
0.0000 ug/kg/h | INTRAVENOUS | Status: DC
  Administered 2022-02-16 (×2): 0.7 ug/kg/h via INTRAVENOUS
  Filled 2022-02-16: qty 100

## 2022-02-16 MED ORDER — SODIUM BICARBONATE 8.4 % IV SOLN
50.0000 meq | Freq: Once | INTRAVENOUS | Status: AC
  Administered 2022-02-16: 50 meq via INTRAVENOUS

## 2022-02-16 MED ORDER — ORAL CARE MOUTH RINSE: 15.0000 mL | Freq: Once | OROMUCOSAL | Status: AC

## 2022-02-16 MED ORDER — PHENYLEPHRINE 80 MCG/ML (10ML) SYRINGE FOR IV PUSH (FOR BLOOD PRESSURE SUPPORT)
PREFILLED_SYRINGE | INTRAVENOUS | Status: DC | PRN
  Administered 2022-02-16: 80 ug via INTRAVENOUS

## 2022-02-16 MED ORDER — 0.9 % SODIUM CHLORIDE (POUR BTL) OPTIME
TOPICAL | Status: DC | PRN
  Administered 2022-02-16: 5000 mL

## 2022-02-16 MED ORDER — OXYCODONE HCL 5 MG PO TABS
5.0000 mg | ORAL_TABLET | ORAL | Status: DC | PRN
  Administered 2022-02-16 – 2022-02-17 (×5): 10 mg via ORAL
  Filled 2022-02-16 (×5): qty 2

## 2022-02-16 MED ORDER — CHLORHEXIDINE GLUCONATE CLOTH 2 % EX PADS
6.0000 | MEDICATED_PAD | Freq: Every day | CUTANEOUS | Status: DC
  Administered 2022-02-17 – 2022-02-21 (×4): 6 via TOPICAL

## 2022-02-16 MED ORDER — CHLORHEXIDINE GLUCONATE 0.12 % MT SOLN
OROMUCOSAL | Status: AC
  Administered 2022-02-16: 15 mL via OROMUCOSAL
  Filled 2022-02-16: qty 15

## 2022-02-16 MED ORDER — CHLORHEXIDINE GLUCONATE 0.12 % MT SOLN
15.0000 mL | OROMUCOSAL | Status: AC
  Administered 2022-02-16: 15 mL via OROMUCOSAL

## 2022-02-16 MED ORDER — MORPHINE SULFATE (PF) 2 MG/ML IV SOLN
1.0000 mg | INTRAVENOUS | Status: DC | PRN
  Administered 2022-02-16 – 2022-02-17 (×3): 4 mg via INTRAVENOUS
  Administered 2022-02-17: 2 mg via INTRAVENOUS
  Filled 2022-02-16: qty 1
  Filled 2022-02-16 (×3): qty 2

## 2022-02-16 MED ORDER — ~~LOC~~ CARDIAC SURGERY, PATIENT & FAMILY EDUCATION
Freq: Once | Status: DC
  Filled 2022-02-16: qty 1

## 2022-02-16 MED ORDER — SODIUM CHLORIDE 0.9% FLUSH
10.0000 mL | Freq: Two times a day (BID) | INTRAVENOUS | Status: DC
  Administered 2022-02-16 – 2022-02-17 (×3): 10 mL
  Administered 2022-02-18: 20 mL

## 2022-02-16 MED ORDER — PANTOPRAZOLE SODIUM 40 MG PO TBEC
40.0000 mg | DELAYED_RELEASE_TABLET | Freq: Every day | ORAL | Status: DC
  Administered 2022-02-18 – 2022-02-25 (×8): 40 mg via ORAL
  Filled 2022-02-16 (×8): qty 1

## 2022-02-16 MED ORDER — INSULIN REGULAR(HUMAN) IN NACL 100-0.9 UT/100ML-% IV SOLN: INTRAVENOUS | Status: DC

## 2022-02-16 MED ORDER — LEVOTHYROXINE SODIUM 25 MCG PO TABS
25.0000 ug | ORAL_TABLET | Freq: Every day | ORAL | Status: DC
  Administered 2022-02-17 – 2022-02-25 (×9): 25 ug via ORAL
  Filled 2022-02-16 (×10): qty 1

## 2022-02-16 MED ORDER — METOPROLOL TARTRATE 12.5 MG HALF TABLET: 12.5000 mg | ORAL_TABLET | Freq: Two times a day (BID) | ORAL | Status: DC

## 2022-02-16 MED ORDER — ROCURONIUM BROMIDE 10 MG/ML (PF) SYRINGE
PREFILLED_SYRINGE | INTRAVENOUS | Status: DC | PRN
  Administered 2022-02-16: 50 mg via INTRAVENOUS
  Administered 2022-02-16: 30 mg via INTRAVENOUS
  Administered 2022-02-16: 70 mg via INTRAVENOUS
  Administered 2022-02-16 (×2): 50 mg via INTRAVENOUS

## 2022-02-16 MED ORDER — SODIUM CHLORIDE 0.9% FLUSH
3.0000 mL | Freq: Two times a day (BID) | INTRAVENOUS | Status: DC
  Administered 2022-02-17 – 2022-02-22 (×9): 3 mL via INTRAVENOUS

## 2022-02-16 MED ORDER — BUPIVACAINE LIPOSOME 1.3 % IJ SUSP: INTRAMUSCULAR | Status: DC | PRN

## 2022-02-16 MED ORDER — TRAMADOL HCL 50 MG PO TABS
50.0000 mg | ORAL_TABLET | ORAL | Status: DC | PRN
  Administered 2022-02-17 – 2022-02-18 (×3): 100 mg via ORAL
  Filled 2022-02-16 (×3): qty 2

## 2022-02-16 MED ORDER — METOPROLOL TARTRATE 5 MG/5ML IV SOLN
2.5000 mg | INTRAVENOUS | Status: DC | PRN
  Administered 2022-02-19 (×2): 2.5 mg via INTRAVENOUS
  Filled 2022-02-16: qty 5

## 2022-02-16 MED ORDER — ALBUMIN HUMAN 5 % IV SOLN
250.0000 mL | INTRAVENOUS | Status: AC | PRN
  Administered 2022-02-16 (×2): 12.5 g via INTRAVENOUS

## 2022-02-16 MED ORDER — NOREPINEPHRINE 4 MG/250ML-% IV SOLN: 0.0000 ug/min | INTRAVENOUS | Status: DC

## 2022-02-16 MED ORDER — SODIUM CHLORIDE 0.9 % IV SOLN: INTRAVENOUS | Status: DC | PRN

## 2022-02-16 MED ORDER — ACETAMINOPHEN 650 MG RE SUPP
650.0000 mg | Freq: Once | RECTAL | Status: AC
  Administered 2022-02-16: 650 mg via RECTAL

## 2022-02-16 MED ORDER — DOCUSATE SODIUM 100 MG PO CAPS
200.0000 mg | ORAL_CAPSULE | Freq: Every day | ORAL | Status: DC
  Administered 2022-02-17 – 2022-02-19 (×3): 200 mg via ORAL
  Filled 2022-02-16 (×8): qty 2

## 2022-02-16 MED ORDER — ONDANSETRON HCL 4 MG/2ML IJ SOLN: 4.0000 mg | Freq: Four times a day (QID) | INTRAMUSCULAR | Status: DC | PRN

## 2022-02-16 MED ORDER — ACETAMINOPHEN 500 MG PO TABS
1000.0000 mg | ORAL_TABLET | Freq: Four times a day (QID) | ORAL | Status: AC
  Administered 2022-02-16 – 2022-02-21 (×19): 1000 mg via ORAL
  Filled 2022-02-16 (×17): qty 2

## 2022-02-16 MED ORDER — PROTAMINE SULFATE 10 MG/ML IV SOLN
INTRAVENOUS | Status: DC | PRN
  Administered 2022-02-16: 370 mg via INTRAVENOUS
  Administered 2022-02-16: 30 mg via INTRAVENOUS

## 2022-02-16 MED ORDER — PROPOFOL 10 MG/ML IV BOLUS
INTRAVENOUS | Status: AC
  Filled 2022-02-16: qty 20

## 2022-02-16 MED ORDER — METOPROLOL TARTRATE 12.5 MG HALF TABLET
12.5000 mg | ORAL_TABLET | Freq: Once | ORAL | Status: AC
  Administered 2022-02-16: 12.5 mg via ORAL
  Filled 2022-02-16: qty 1

## 2022-02-16 MED ORDER — HEMOSTATIC AGENTS (NO CHARGE) OPTIME
TOPICAL | Status: DC | PRN
  Administered 2022-02-16 (×2): 1 via TOPICAL

## 2022-02-16 MED ORDER — SODIUM CHLORIDE 0.9% FLUSH: 10.0000 mL | INTRAVENOUS | Status: DC | PRN

## 2022-02-16 MED ORDER — ASPIRIN 325 MG PO TBEC
325.0000 mg | DELAYED_RELEASE_TABLET | Freq: Every day | ORAL | Status: DC
  Administered 2022-02-17 – 2022-02-23 (×7): 325 mg via ORAL
  Filled 2022-02-16 (×7): qty 1

## 2022-02-16 MED ORDER — FENTANYL CITRATE (PF) 250 MCG/5ML IJ SOLN
INTRAMUSCULAR | Status: DC | PRN
  Administered 2022-02-16: 250 ug via INTRAVENOUS
  Administered 2022-02-16: 100 ug via INTRAVENOUS
  Administered 2022-02-16: 150 ug via INTRAVENOUS
  Administered 2022-02-16: 200 ug via INTRAVENOUS
  Administered 2022-02-16: 50 ug via INTRAVENOUS
  Administered 2022-02-16: 250 ug via INTRAVENOUS

## 2022-02-16 MED ORDER — HEPARIN SODIUM (PORCINE) 1000 UNIT/ML IJ SOLN
INTRAMUSCULAR | Status: AC
  Filled 2022-02-16: qty 2

## 2022-02-16 MED ORDER — CHLORHEXIDINE GLUCONATE CLOTH 2 % EX PADS
6.0000 | MEDICATED_PAD | Freq: Every day | CUTANEOUS | Status: AC
  Administered 2022-02-16 – 2022-02-20 (×5): 6 via TOPICAL

## 2022-02-16 MED ORDER — MIDAZOLAM HCL (PF) 5 MG/ML IJ SOLN
INTRAMUSCULAR | Status: DC | PRN
  Administered 2022-02-16: 2 mg via INTRAVENOUS
  Administered 2022-02-16: 1 mg via INTRAVENOUS
  Administered 2022-02-16: 2 mg via INTRAVENOUS
  Administered 2022-02-16: 4 mg via INTRAVENOUS
  Administered 2022-02-16: 1 mg via INTRAVENOUS
  Administered 2022-02-16: 2 mg via INTRAVENOUS

## 2022-02-16 SURGICAL SUPPLY — 129 items
ADAPTER CARDIO PERF ANTE/RETRO (ADAPTER) ×3 IMPLANT
ADAPTER MULTI PERFUSION 15 (ADAPTER) IMPLANT
ADH SKN CLS APL DERMABOND .7 (GAUZE/BANDAGES/DRESSINGS) ×4
ADPR PRFSN 84XANTGRD RTRGD (ADAPTER) ×2
APPLIER CLIP 11 MED OPEN (CLIP) ×2
APPLIER CLIP 9.375 SM OPEN (CLIP) ×2
APR CLP MED 11 20 MLT OPN (CLIP) ×2
APR CLP SM 9.3 20 MLT OPN (CLIP) ×2
BAG DECANTER FOR FLEXI CONT (MISCELLANEOUS) ×3 IMPLANT
BLADE CLIPPER SURG (BLADE) ×3 IMPLANT
BLADE STERNUM SYSTEM 6 (BLADE) ×3 IMPLANT
BLADE SURG 15 STRL LF DISP TIS (BLADE) ×3 IMPLANT
BLADE SURG 15 STRL SS (BLADE) ×2
BOOT SUTURE AID YELLOW STND (SUTURE) IMPLANT
CANISTER SUCT 3000ML PPV (MISCELLANEOUS) ×3 IMPLANT
CANN PRFSN 3/8X14X24FR PCFC (MISCELLANEOUS) ×2
CANN PRFSN 3/8XRT ANG TPR 14 (MISCELLANEOUS) ×2
CANNULA AORTIC ROOT 9FR (CANNULA) IMPLANT
CANNULA CARDIOPLEGIA 14FRX32 (CANNULA) IMPLANT
CANNULA EZ GLIDE AORTIC 21FR (CANNULA) IMPLANT
CANNULA GUNDRY RCSP 15FR (MISCELLANEOUS) ×3 IMPLANT
CANNULA PRFSN 3/8X14X24FR PCFC (MISCELLANEOUS) IMPLANT
CANNULA PRFSN 3/8XRT ANG TPR14 (MISCELLANEOUS) IMPLANT
CANNULA SUMP PERICARDIAL (CANNULA) ×3 IMPLANT
CANNULA VEN MTL TIP RT (MISCELLANEOUS) ×4
CATH ROBINSON RED A/P 18FR (CATHETERS) ×9 IMPLANT
CATH THORACIC 36FR (CATHETERS) ×3 IMPLANT
CATH THORACIC 36FR RT ANG (CATHETERS) ×3 IMPLANT
CLIP APPLIE 11 MED OPEN (CLIP) IMPLANT
CLIP APPLIE 9.375 SM OPEN (CLIP) IMPLANT
CNTNR URN SCR LID CUP LEK RST (MISCELLANEOUS) IMPLANT
COLLAR AND TIE MIAMI (INSTRUMENTS) IMPLANT
CONN 1/2X1/2X1/2  BEN (MISCELLANEOUS) ×4
CONN 1/2X1/2X1/2 BEN (MISCELLANEOUS) ×3 IMPLANT
CONN 3/8X1/2 ST GISH (MISCELLANEOUS) ×6 IMPLANT
CONN ST 1/2X1/2  BEN (MISCELLANEOUS) ×2
CONN ST 1/2X1/2 BEN (MISCELLANEOUS) IMPLANT
CONN ST 3/8 X 1/2 (MISCELLANEOUS) IMPLANT
CONNECTOR BLAKE 2:1 CARIO BLK (MISCELLANEOUS) IMPLANT
CONT SPEC 4OZ STRL OR WHT (MISCELLANEOUS) ×2
CONTAINER PROTECT SURGISLUSH (MISCELLANEOUS) ×6 IMPLANT
COVER SURGICAL LIGHT HANDLE (MISCELLANEOUS) ×6 IMPLANT
DERMABOND ADVANCED .7 DNX12 (GAUZE/BANDAGES/DRESSINGS) IMPLANT
DEV SUMP PERICARDIAL 20F 15IN (MISCELLANEOUS) IMPLANT
DEVICE SUT CK QUICK LOAD MINI (Prosthesis & Implant Heart) IMPLANT
DRAIN CHANNEL 24FR (DRAIN) IMPLANT
DRAPE CARDIOVASCULAR INCISE (DRAPES) ×2
DRAPE SLUSH/WARMER DISC (DRAPES) IMPLANT
DRAPE SRG 135X102X78XABS (DRAPES) ×3 IMPLANT
DRAPE WARM FLUID 44X44 (DRAPES) IMPLANT
DRSG AQUACEL AG ADV 3.5X14 (GAUZE/BANDAGES/DRESSINGS) IMPLANT
DRSG COVADERM 4X14 (GAUZE/BANDAGES/DRESSINGS) ×3 IMPLANT
ELECT CAUTERY BLADE 6.4 (BLADE) ×3 IMPLANT
ELECT REM PT RETURN 9FT ADLT (ELECTROSURGICAL) ×4
ELECTRODE REM PT RTRN 9FT ADLT (ELECTROSURGICAL) ×6 IMPLANT
FELT TEFLON 1X6 (MISCELLANEOUS) ×6 IMPLANT
GAUZE 4X4 16PLY ~~LOC~~+RFID DBL (SPONGE) ×3 IMPLANT
GAUZE SPONGE 4X4 12PLY STRL (GAUZE/BANDAGES/DRESSINGS) ×6 IMPLANT
GLOVE BIO SURGEON STRL SZ 6 (GLOVE) IMPLANT
GLOVE BIO SURGEON STRL SZ 6.5 (GLOVE) IMPLANT
GLOVE BIOGEL PI IND STRL 7.0 (GLOVE) IMPLANT
GLOVE BIOGEL PI IND STRL 7.5 (GLOVE) IMPLANT
GLOVE SURG MICRO LTX SZ7 (GLOVE) ×6 IMPLANT
GLOVE SURG SS PI 7.5 STRL IVOR (GLOVE) IMPLANT
GOWN STRL REUS W/ TWL LRG LVL3 (GOWN DISPOSABLE) ×12 IMPLANT
GOWN STRL REUS W/ TWL XL LVL3 (GOWN DISPOSABLE) ×3 IMPLANT
GOWN STRL REUS W/TWL LRG LVL3 (GOWN DISPOSABLE) ×6
GOWN STRL REUS W/TWL XL LVL3 (GOWN DISPOSABLE) ×10
HEMOSTAT POWDER SURGIFOAM 1G (HEMOSTASIS) ×9 IMPLANT
HEMOSTAT SURGICEL 2X14 (HEMOSTASIS) ×3 IMPLANT
HEMOSTAT SURGICEL NU-KNIT 6X9 (HEMOSTASIS) IMPLANT
INSERT FOGARTY XLG (MISCELLANEOUS) IMPLANT
KIT BASIN OR (CUSTOM PROCEDURE TRAY) ×3 IMPLANT
KIT DRAINAGE VACCUM ASSIST (KITS) IMPLANT
KIT SUCTION CATH 14FR (SUCTIONS) ×3 IMPLANT
KIT SUT CK MINI COMBO 4X17 (Prosthesis & Implant Heart) IMPLANT
KIT TURNOVER KIT B (KITS) ×3 IMPLANT
LINE VENT (MISCELLANEOUS) IMPLANT
LOOP VESSEL SUPERMAXI WHITE (MISCELLANEOUS) ×3 IMPLANT
NDL SUT 1 .5 CRC FRENCH EYE (NEEDLE) IMPLANT
NEEDLE FRENCH EYE (NEEDLE) ×2
NS IRRIG 1000ML POUR BTL (IV SOLUTION) ×15 IMPLANT
ORGANIZER SUTURE GABBAY-FRATER (MISCELLANEOUS) IMPLANT
PACK OPEN HEART (CUSTOM PROCEDURE TRAY) ×3 IMPLANT
PAD ARMBOARD 7.5X6 YLW CONV (MISCELLANEOUS) ×6 IMPLANT
PENCIL BUTTON HOLSTER BLD 10FT (ELECTRODE) IMPLANT
POSITIONER HEAD DONUT 9IN (MISCELLANEOUS) ×3 IMPLANT
RING ANLPLS CARP-EDW PHY II 34 (Prosthesis & Implant Heart) IMPLANT
RING ANNULOPLASTY PHY II (Prosthesis & Implant Heart) ×2 IMPLANT
SET MPS 3-ND DEL (MISCELLANEOUS) IMPLANT
SPONGE T-LAP 18X18 ~~LOC~~+RFID (SPONGE) ×12 IMPLANT
SPONGE T-LAP 4X18 ~~LOC~~+RFID (SPONGE) ×3 IMPLANT
SUT BONE WAX W31G (SUTURE) ×3 IMPLANT
SUT ETHIBOND (SUTURE) IMPLANT
SUT ETHIBOND 2 0 SH (SUTURE) ×6 IMPLANT
SUT ETHIBOND 2 0 SH 36X2 (SUTURE) ×3 IMPLANT
SUT ETHIBOND 2 0 V4 (SUTURE) IMPLANT
SUT ETHIBOND 2 0V4 GREEN (SUTURE) IMPLANT
SUT ETHIBOND 2-0 RB-1 WHT (SUTURE) IMPLANT
SUT ETHIBOND 5 0 C 1 30 (SUTURE) IMPLANT
SUT ETHILON 2 LR (SUTURE) IMPLANT
SUT GORETEX CV-5 TH-26 (SUTURE) IMPLANT
SUT MNCRL AB 3-0 PS2 18 (SUTURE) IMPLANT
SUT PERMA SILK 0 CT1 (SUTURE) IMPLANT
SUT PROLENE 3 0 SH 48 (SUTURE) ×12 IMPLANT
SUT PROLENE 3 0 SH DA (SUTURE) IMPLANT
SUT PROLENE 3 0 SH1 36 (SUTURE) ×3 IMPLANT
SUT PROLENE 4 0 RB 1 (SUTURE) ×18
SUT PROLENE 4 0 SH DA (SUTURE) IMPLANT
SUT PROLENE 4-0 RB1 .5 CRCL 36 (SUTURE) ×6 IMPLANT
SUT PROLENE 4-0 RB1 18X2 ARM (SUTURE) IMPLANT
SUT PROLENE 6 0 C 1 30 (SUTURE) IMPLANT
SUT STEEL STERNAL CCS#1 18IN (SUTURE) IMPLANT
SUT STEEL SZ 6 DBL 3X14 BALL (SUTURE) IMPLANT
SUT VIC AB 0 CT1 27 (SUTURE) ×4
SUT VIC AB 0 CT1 27XCR 8 STRN (SUTURE) IMPLANT
SUT VIC AB 1 CTX 36 (SUTURE) ×8
SUT VIC AB 1 CTX36XBRD ANBCTR (SUTURE) ×6 IMPLANT
SUT VIC AB 2-0 CTX 36 (SUTURE) IMPLANT
SYR BULB IRRIG 60ML STRL (SYRINGE) IMPLANT
SYSTEM SAHARA CHEST DRAIN ATS (WOUND CARE) ×3 IMPLANT
TAPE CLOTH SURG 4X10 WHT LF (GAUZE/BANDAGES/DRESSINGS) IMPLANT
TAPE PAPER 2X10 WHT MICROPORE (GAUZE/BANDAGES/DRESSINGS) IMPLANT
TOWEL GREEN STERILE (TOWEL DISPOSABLE) ×3 IMPLANT
TOWEL GREEN STERILE FF (TOWEL DISPOSABLE) ×3 IMPLANT
TRAY FOLEY SLVR 16FR TEMP STAT (SET/KITS/TRAYS/PACK) ×3 IMPLANT
TUBING ART PRESS 48 MALE/FEM (TUBING) IMPLANT
UNDERPAD 30X36 HEAVY ABSORB (UNDERPADS AND DIAPERS) ×3 IMPLANT
WATER STERILE IRR 1000ML POUR (IV SOLUTION) ×6 IMPLANT

## 2022-02-16 NOTE — Consult Note (Signed)
NAME:  Melissa Riley, MRN:  [id number], DOB:  February 01, 1965, LOS: 0 ADMISSION DATE:  02/16/2022, CONSULTATION DATE:  1/24 REFERRING MD:  Dr. Tenny Craw, CHIEF COMPLAINT:  MVR   History of Present Illness:  Patient is a 57 year old female with pertinent PMH severe mitral regurgitation, HTN, hypothyroidism presents to [facility] on 1/24 for MVR.  Patient seen on 12/2021 by cardiology.  Most recent echo showing severe MVR.  Most recent heart cath without any significant CAD.  Referred to CT surgery for consideration of mitral valve replacement.  On 1/24 MVR performed.  Postop intubated and transferred to [facility] ICU.  CT in place.  On minimal epi and levo.  PCCM consulted.  Pertinent  Medical History   Past Medical History:  Diagnosis Date   Arthritis    Heart murmur    Hypertension    Hypothyroidism    Mitral regurgitation    Uterine fibroid      Significant Hospital Events: Including procedures, antibiotic start and stop dates in addition to other pertinent events   1/24 s/p MVR; post op intubated; pccm consulted  Interim History / Subjective:  Intubated on simv Precedex for sedation On 2 epi and 2 levo  Objective   Blood pressure (!) 163/95, pulse 77, temperature 98.8 F (37.1 C), temperature source Oral, resp. rate 15, height '5\' 3"'$  (1.6 m), weight 103.4 kg, SpO2 97 %. PAP: (6-29)/(-5-10) 6/-3  Vent Mode: SIMV;PRVC;PSV FiO2 (%):  [50 %] 50 % Set Rate:  [16 bmp] 16 bmp Vt Set:  [420 mL] 420 mL PEEP:  [5 cmH20] 5 cmH20 Pressure Support:  [10 cmH20] 10 cmH20 Plateau Pressure:  [14 cmH20] 14 cmH20   Intake/Output Summary (Last 24 hours) at 02/16/2022 1441 Last data filed at 02/16/2022 1305 Gross per 24 hour  Intake 2535 ml  Output 1803 ml  Net 732 ml   Filed Weights   02/16/22 0544  Weight: 103.4 kg    Examination: General:  critically ill appearing on mech vent HEENT: MM pink/moist; ETT in place Neuro: sedate CV: s1s2, paced rate 80s, no m/r/g; CT in place PULM:  dim clear  BS bilaterally; on mech vent simv GI: soft, bsx4 active  Extremities: warm/dry, trace ble edema  Skin: no rashes or lesions appreciated   Resolved Hospital Problem list     Assessment & Plan:   Post op vent management Plan: - Continue full vent support (4-8cc/kg IBW) - Wean FiO2 for O2 sat > 90% - Daily WUA/SBT, rapid wean with SIMV per protocol - VAP bundle - Pulmonary hygiene - F/u ABG and CXR - PAD protocol for sedation: Precedex for goal RASS 0 to -1   S/p 3-vessel MVR HTN Plan: - Postoperative care per TCTS - CT management per protocol - wean epi/levo for map goal >65 - cont ancef/vanc for surgical ppx - ASA - wean insulin gtt per protocol - pt, ptt, inr, cbc, bmp pending - hold home anti-hypertensive's  Iron deficiency Anemia Plan: -trend cbc -transfuse for hgb <7 -consider resuming home iron  Hypothyroidism Plan: -resume home synthroid  Uterine fibroid Plan: -further f/u outpt with obgyn -hold home megace  Best Practice (right click and "Reselect all SmartList Selections" daily)   Diet/type: NPO w/ meds via tube DVT prophylaxis: SCD GI prophylaxis: H2B Lines: Central line and Arterial Line Foley:  Yes, and it is still needed Code Status:  full code Last date of multidisciplinary goals of care discussion [per primary]  Labs   CBC: Recent Labs  Lab  02/14/22 0916 02/16/22 0809 02/16/22 1119 02/16/22 1145 02/16/22 1146 02/16/22 1229 02/16/22 1232  WBC 7.1  --   --   --   --   --   --   HGB 13.8   < > 8.8* 8.8* 8.8* 9.2* 9.9*  HCT 42.2   < > 26.0* 26.0* 25.9* 27.0* 29.0*  MCV 96.3  --   --   --   --   --   --   PLT 252  --   --   --  144*  --   --    < > = values in this interval not displayed.    Basic Metabolic Panel: Recent Labs  Lab 02/14/22 0916 02/16/22 0809 02/16/22 0853 02/16/22 0916 02/16/22 0950 02/16/22 1017 02/16/22 1048 02/16/22 1119 02/16/22 1145 02/16/22 1229 02/16/22 1232  NA 139   < > 140   < > 138   < > 143  140 140 140 141  K 3.8   < > 3.1*   < > 3.9   < > 4.0 4.0 4.2 3.6 3.6  CL 108   < > 105  --  103  --  103  --  105 105  --   CO2 20*  --   --   --   --   --   --   --   --   --   --   GLUCOSE 103*   < > 122*  --  115*  --  127*  --  119* 153*  --   BUN 12   < > 17  --  16  --  15  --  15 16  --   CREATININE 0.72   < > 0.50  --  0.50  --  0.60  --  0.50 0.50  --   CALCIUM 9.3  --   --   --   --   --   --   --   --   --   --    < > = values in this interval not displayed.   GFR: Estimated Creatinine Clearance: 91.3 mL/min (by C-G formula based on SCr of 0.5 mg/dL). Recent Labs  Lab 02/14/22 0916  WBC 7.1    Liver Function Tests: Recent Labs  Lab 02/14/22 0916  AST 24  ALT 16  ALKPHOS 53  BILITOT 0.8  PROT 7.2  ALBUMIN 4.3   No results for input(s): "LIPASE", "AMYLASE" in the last 168 hours. No results for input(s): "AMMONIA" in the last 168 hours.  ABG    Component Value Date/Time   PHART 7.383 02/16/2022 1232   PCO2ART 32.9 02/16/2022 1232   PO2ART 197 (H) 02/16/2022 1232   HCO3 19.6 (L) 02/16/2022 1232   TCO2 21 (L) 02/16/2022 1232   ACIDBASEDEF 5.0 (H) 02/16/2022 1232   O2SAT 100 02/16/2022 1232     Coagulation Profile: Recent Labs  Lab 02/14/22 0916  INR 1.1    Cardiac Enzymes: No results for input(s): "CKTOTAL", "CKMB", "CKMBINDEX", "TROPONINI" in the last 168 hours.  HbA1C: Hgb A1c MFr Bld  Date/Time Value Ref Range Status  02/14/2022 09:16 AM 5.2 4.8 - 5.6 % Final    Comment:    (NOTE) Pre diabetes:          5.7%-6.4%  Diabetes:              >6.4%  Glycemic control for   <7.0% adults with diabetes     CBG: No  results for input(s): "GLUCAP" in the last 168 hours.  Review of Systems:   Patient is encephalopathic and/or intubated. Therefore history has been obtained from chart review.    Past Medical History:  She,  has a past medical history of Arthritis, Heart murmur, Hypertension, Hypothyroidism, Mitral regurgitation, and Uterine  fibroid.   Surgical History:   Past Surgical History:  Procedure Laterality Date   BREAST BIOPSY Left    " Patient states over 20 years ago"   RIGHT/LEFT HEART CATH AND CORONARY ANGIOGRAPHY N/A 04/05/2021   Procedure: RIGHT/LEFT HEART CATH AND CORONARY ANGIOGRAPHY;  Surgeon: Jettie Booze, MD;  Location: [facility];  Service: Cardiovascular;  Laterality: N/A;   TEE WITHOUT CARDIOVERSION N/A 02/10/2017   Procedure: TRANSESOPHAGEAL ECHOCARDIOGRAM (TEE);  Surgeon: Adrian Prows, MD;  Location: [place];  Service: Cardiovascular;  Laterality: N/A;   TEE WITHOUT CARDIOVERSION N/A 04/05/2021   Procedure: TRANSESOPHAGEAL ECHOCARDIOGRAM (TEE);  Surgeon: [name], MD;  Location: [facility];  Service: Cardiovascular;  Laterality: N/A;   TUBAL LIGATION     WISDOM TOOTH EXTRACTION Bilateral 2004     Social History:   reports that she has never smoked. She has never used smokeless tobacco. She reports that she does not drink alcohol and does not use drugs.   Family History:  Her family history includes Breast cancer in her cousin and maternal aunt; Heart disease in her maternal aunt and mother; Hypertension in her mother.   Allergies Allergies  Allergen Reactions   Amoxil [Amoxicillin] Other (See Comments)    Yeast infection     Home Medications  Prior to Admission medications   Medication Sig Start Date End Date Taking? Authorizing Provider  amLODipine (NORVASC) 5 MG tablet Take 5 mg by mouth daily.   Yes [provider]  Ascorbic Acid (VITAMIN C PO) Take 1 tablet by mouth daily.   Yes [provider]  Cholecalciferol (VITAMIN D3) 25 MCG (1000 UT) CHEW Chew by mouth daily. 2 gummies daily   Yes [provider]  ferrous sulfate 324 MG TBEC Take 324 mg by mouth 2 (two) times daily.   Yes [provider]  hydrochlorothiazide (HYDRODIURIL) 25 MG tablet Take 25 mg by mouth daily.   Yes [provider]  levothyroxine  (SYNTHROID) 25 MCG tablet Take 25 mcg by mouth daily before breakfast.   Yes [provider]  lisinopril (ZESTRIL) 40 MG tablet Take 1 tablet (40 mg total) by mouth daily. 03/11/21  Yes O'Neal, Cassie Freer, MD  megestrol (MEGACE) 40 MG tablet Take 1 tablet (40 mg total) by mouth 2 (two) times daily. 10/25/21  Yes [name], MD     Critical care time: 45 minutes    JD Rexene Agent [facility] 02/16/2022, 2:41 PM  Please see [email or web address] for pager details.  From 7A-7P if no response, please call [phone]. After hours, please call ELink [phone].

## 2022-02-16 NOTE — Progress Notes (Signed)
[id number]       [place] [address]             [phone]       EVENING ROUNDS  POD #0 SP mitral repair Hemodynamics ok Rhythm issues needing pacing Hopefully wean vent soon

## 2022-02-16 NOTE — Anesthesia Procedure Notes (Signed)
Arterial Line Insertion Start/End1/24/2024 6:48 AM, 02/16/2022 6:53 AM Performed by: Barrington Ellison, CRNA  Preanesthetic checklist: IV checked and pre-op evaluation Lidocaine 1% used for infiltration and patient sedated Left, radial was placed Catheter size: 20 G Maximum sterile barriers used   Attempts: 1 Procedure performed without using ultrasound guided technique. Following insertion, dressing applied and Biopatch. Post procedure assessment: normal and unchanged  Patient tolerated the procedure well with no immediate complications. Additional procedure comments: Placed by Kriste Basque, SRNA.

## 2022-02-16 NOTE — Transfer of Care (Signed)
Immediate Anesthesia Transfer of Care Note  Patient: Melissa Riley  Procedure(s) Performed: MITRAL VALVE REPAIR (MVR) USING 34 MM EDWARDS PHYSIO II ANNULOPLASTY RING (Chest) TRANSESOPHAGEAL ECHOCARDIOGRAM (TEE)  Patient Location: ICU  Anesthesia Type:General  Level of Consciousness: Patient remains intubated per anesthesia plan  Airway & Oxygen Therapy: Patient remains intubated per anesthesia plan and Patient placed on Ventilator (see vital sign flow sheet for setting)  Post-op Assessment: Report given to RN  Post vital signs: Reviewed and stable  Last Vitals:  Vitals Value Taken Time  BP 94/55   Temp 35.3 C 02/16/22 1425  Pulse 54 02/16/22 1425  Resp 14 02/16/22 1425  SpO2 99 % 02/16/22 1425  Vitals shown include unvalidated device data.  Last Pain:  Vitals:   02/16/22 0610  TempSrc:   PainSc: 0-No pain         Complications: No notable events documented.

## 2022-02-16 NOTE — Progress Notes (Signed)
Echocardiogram Echocardiogram Transesophageal has been performed.  Fidel Levy 02/16/2022, 9:08 AM

## 2022-02-16 NOTE — Anesthesia Procedure Notes (Signed)
Procedure Name: Intubation Date/Time: 02/16/2022 7:58 AM  Performed by: Barrington Ellison, CRNAPre-anesthesia Checklist: Patient identified, Emergency Drugs available, Suction available and Patient being monitored Patient Re-evaluated:Patient Re-evaluated prior to induction Oxygen Delivery Method: Circle System Utilized Preoxygenation: Pre-oxygenation with 100% oxygen Induction Type: IV induction Ventilation: Mask ventilation without difficulty Laryngoscope Size: Mac and 3 Grade View: Grade II Tube type: Oral Tube size: 7.5 mm Number of attempts: 1 Airway Equipment and Method: Stylet and Oral airway Placement Confirmation: ETT inserted through vocal cords under direct vision, positive ETCO2 and breath sounds checked- equal and bilateral Secured at: 22 cm Tube secured with: Tape Dental Injury: Teeth and Oropharynx as per pre-operative assessment

## 2022-02-16 NOTE — Brief Op Note (Addendum)
**Note Melissa-Identified via Obfuscation** 02/16/2022  12:25 PM  PATIENT:  Melissa Riley  57 y.o. female  PRE-OPERATIVE DIAGNOSIS:  Mitral Regurgitation  POST-OPERATIVE DIAGNOSIS:  Mitral Regurgitation  PROCEDURE:  MITRAL VALVE REPAIR USING 34 MM EDWARDS PHYSIO II ANNULOPLASTY RING   Placement f GorTex NeoCords to the posterior leaflet,  Mitral Commissuroplasty   TRANSESOPHAGEAL ECHOCARDIOGRAM   SURGEON: Enter, Pierre Bali, MD - Primary  PHYSICIAN ASSISTANT: Cervando Durnin  ASSISTANTS: Bruins, Brooklyn L, Scrub Person         Buzzy Han, RN, RN First Assistant   ANESTHESIA:   general  EBL: 215m  BLOOD ADMINISTERED:none  DRAINS:  right pleural and mediastinal tubes    LOCAL MEDICATIONS USED:  NONE  COUNTS:  Correct  DICTATION: .Dragon Dictation  PLAN OF CARE: Admit to inpatient   PATIENT DISPOSITION:  ICU - intubated and hemodynamically stable.   Delay start of Pharmacological VTE agent (>24hrs) due to surgical blood loss or risk of bleeding: yes

## 2022-02-16 NOTE — Anesthesia Procedure Notes (Signed)
Central Venous Catheter Insertion Performed by: Albertha Ghee, MD, anesthesiologist Start/End1/24/2024 7:15 AM, 02/16/2022 7:17 AM Patient location: Pre-op. Preanesthetic checklist: patient identified, IV checked, site marked, risks and benefits discussed, surgical consent, monitors and equipment checked, pre-op evaluation, timeout performed and anesthesia consent Hand hygiene performed  and maximum sterile barriers used  PA cath was placed.Swan type:thermodilution Procedure performed without using ultrasound guided technique. Attempts: 1 Patient tolerated the procedure well with no immediate complications.

## 2022-02-16 NOTE — H&P (Signed)
UPDATED H&P  No sig changes to h and p.   Sternotomy MV repair.    [address]       [place] [address]             [phone]                                                   Tylene M Joos Morrison Medical Record #[id number] Date of Birth: 15-Jun-1965   Referring: Lenna Sciara, NP Primary Care: Joycelyn Man, FNP Primary Cardiologist: Evalina Field, MD   Chief Complaint:        Chief Complaint  Patient presents with   Mitral Regurgitation      Surgical consult, Cardiac Cath and ECHO 04/05/21/ ECHO 02/26/21      History of Present Illness:    Melissa Riley is a 57 y.o. female presents for evaluation of severe mitral regurgitation. She reports she can get short of breath with some activity.    She reports no chest pain.  EKG shows sinus rhythm with LVH.   She has severe prolapse of the posterior leaflet with possible flail segment. She is also seeing medical providers for bleeding fibroids. Changing pad 1-2 x day.   By cath no sig CAD. Pap 36/18 - elevated. Mean PA 27. Report possible palpitations in past but no definite history.          Past Medical History:  Diagnosis Date   Heart murmur     Hypertension     Mitral regurgitation             Past Surgical History:  Procedure Laterality Date   RIGHT/LEFT HEART CATH AND CORONARY ANGIOGRAPHY N/A 04/05/2021    Procedure: RIGHT/LEFT HEART CATH AND CORONARY ANGIOGRAPHY;  Surgeon: Jettie Booze, MD;  Location: [facility];  Service: Cardiovascular;  Laterality: N/A;   TEE WITHOUT CARDIOVERSION N/A 02/10/2017    Procedure: TRANSESOPHAGEAL ECHOCARDIOGRAM (TEE);  Surgeon: Adrian Prows, MD;  Location: [facility];  Service: Cardiovascular;  Laterality: N/A;   TEE WITHOUT CARDIOVERSION N/A 04/05/2021    Procedure: TRANSESOPHAGEAL ECHOCARDIOGRAM (TEE);  Surgeon: [name], MD;  Location: [facility];  Service: Cardiovascular;  Laterality: N/A;   TUBAL LIGATION                Family History  Problem Relation Age of Onset   Heart disease Mother     Hypertension Mother     Breast cancer Maternal Aunt     Heart disease Maternal Aunt     Breast cancer Cousin          Social History       Tobacco Use  Smoking Status Never  Smokeless Tobacco Never    Social History       Substance and Sexual Activity  Alcohol Use No             Allergies  Allergen Reactions   Amoxil [Amoxicillin] Other (See Comments)      Yeast infection            Current Outpatient Medications  Medication Sig Dispense Refill   acetaminophen (TYLENOL) 500 MG tablet Take 1,000 mg by mouth every 6 (six) hours as needed for moderate pain.       amLODipine (NORVASC) 5 MG tablet Take 5  mg by mouth daily.       hydrochlorothiazide (HYDRODIURIL) 25 MG tablet Take 25 mg by mouth daily.       levothyroxine (SYNTHROID) 25 MCG tablet Take 25 mcg by mouth daily.       lisinopril (ZESTRIL) 40 MG tablet Take 1 tablet (40 mg total) by mouth daily. 90 tablet 1   megestrol (MEGACE) 40 MG tablet Take 1 tablet (40 mg total) by mouth 2 (two) times daily. 180 tablet 2   Multiple Vitamins-Minerals (MULTIVITAMIN WITH MINERALS) tablet Take 1 tablet by mouth daily.        No current facility-administered medications for this visit.      ROS 14 point ROS reviewed and negative except as per HPI     PHYSICAL EXAMINATION: BP (!) 150/87 (BP Location: Right Arm, Patient Position: Sitting, Cuff Size: Large)   Pulse 97   Resp 20   Ht '5\' 2"'$  (1.575 m)   Wt 226 lb (102.5 kg)   SpO2 96% Comment: RA  BMI 41.34 kg/m    Gen: NAD Neuro: Alert and oriented CV: regular, systolic murmur Resp: Nonlaboured Abd: Soft, ntnd Extr: WWP   Diagnostic Studies & Laboratory data:     Recent Radiology Findings:    Imaging Results  No results found.         I have independently reviewed the above radiology studies  and reviewed the findings with the patient.    Recent Lab Findings: Recent Labs        Lab Results  Component Value Date    WBC 5.5 03/11/2021    HGB 13.9 04/05/2021    HCT 41.0 04/05/2021    PLT 239 03/11/2021    GLUCOSE 105 (H) 03/11/2021    ALT 14 01/13/2021    AST 21 01/13/2021    NA 142 04/05/2021    K 3.4 (L) 04/05/2021    CL 102 03/11/2021    CREATININE 0.64 03/11/2021    BUN 10 03/11/2021    CO2 19 (L) 03/11/2021       Patient Name:   Melissa Riley Date of Exam: 04/05/2021  Medical Rec #:  [id number]         Height:       62.0 in  Accession #:    [id number]        Weight:       220.0 lb  Date of Birth:  10-Dec-1965         BSA:          1.991 m  Patient Age:    48 years          BP:           131/77 mmHg  Patient Gender: F                 HR:           98 bpm.  Exam Location:  Inpatient   Procedure: 3D Echo, Transesophageal Echo, Cardiac Doppler and Color  Doppler   Indications:    I34.1 Nonrheumatic mitral (valve) prolapse    History:         Patient has prior history of Echocardiogram examinations,  most                  recent 02/26/2021. Mitral Valve Disease,  Signs/Symptoms:Murmur;                  Risk Factors:Hypertension. Severe mitral regurgitation.    Sonographer:  Roseanna Rainbow RDCS  Referring Phys:  [id number] [place]  Diagnosing Phys: Oswaldo Milian MD   PROCEDURE: After discussion of the risks and benefits of a TEE, an  informed consent was obtained from the patient. The transesophogeal probe  was passed without difficulty through the esophogus of the patient. Imaged  were obtained with the patient in a  left lateral decubitus position. Sedation performed by different  physician. The patient was monitored while under deep sedation.  Anesthestetic sedation was provided intravenously by Anesthesiology: '324mg'$   of Propofol. The patient developed Respiratory  depression during the procedure.  US probe was passed without difficulty but shortly into procedure patient  desatted to less than SpO2 50%.  Probe was withdrawn  and patient received  bag mask ventilation until SpO2 normalized.  Laryngospasm was suspected.   She was more deeply sedated and  procedure was attempted again.  Probe passed without difficulty but again  patient had likely laryngospasm and SpO2 dropped less than 50%.  Probe was  again withdrawn and patient received bag mask ventilation until SpO2  normalized.  Decision was made to  intubate/paralyze so procedure could be completed.  Patient was intubated  and US probe was passed without difficulty and procedure commenced without  any issues.  However when endotracheal tube was removed at end of  procedure, patient vomited.  There was  no clear aspiration.  SpO2 was 100% and lungs clear on exam.      IMPRESSIONS     1. Left ventricular ejection fraction, by estimation, is 60 to 65%. The  left ventricle has normal function.   2. Right ventricular systolic function is normal. The right ventricular  size is normal.   3. Left atrial size was severely dilated. No left atrial/left atrial  appendage thrombus was detected.   4. The aortic valve is tricuspid. Aortic valve regurgitation is not  visualized. No aortic stenosis is present.   5. The mitral valve is abnormal. Flail P1/2. Severe mitral valve  regurgitation. Eccentric anterior directed MR jet. 2D ERO 0.8 cm^2, 3D VCA  0.6 cm^2, severe LA enlargement, systolic flow reversal in RUPV, all  consistent with severe MR.   FINDINGS   Left Ventricle: Left ventricular ejection fraction, by estimation, is 60  to 65%. The left ventricle has normal function. The left ventricular  internal cavity size was normal in size.   Right Ventricle: The right ventricular size is normal. No increase in  right ventricular wall thickness. Right ventricular systolic function is  normal.   Left Atrium: Left atrial size was severely dilated. No left atrial/left  atrial appendage thrombus was detected.   Right Atrium: Right atrial size was normal in size.    Pericardium: There is no evidence of pericardial effusion.   Mitral Valve: The mitral valve is abnormal. Severe mitral valve  regurgitation.   Tricuspid Valve: The tricuspid valve is normal in structure. Tricuspid  valve regurgitation is trivial.   Aortic Valve: The aortic valve is tricuspid. Aortic valve regurgitation is  not visualized. No aortic stenosis is present.   Pulmonic Valve: The pulmonic valve was grossly normal. Pulmonic valve  regurgitation is trivial.   Aorta: The aortic root is normal in size and structure.   IAS/Shunts: No atrial level shunt detected by color flow Doppler.     LEFT VENTRICLE  PLAX 2D  LVOT diam:     2.20 cm  LV SV:         63  LV SV Index:  32        3D Volume EF  LVOT Area:     3.80 cm  LV 3D EDV:   103.10 ml                           LV 3D ESV:   36.68 ml   AORTIC VALVE  LVOT Vmax:   120.00 cm/s  LVOT Vmean:  62.800 cm/s  LVOT VTI:    0.167 m   MR Peak grad:    68.9 mmHg  MR Mean grad:    45.5 mmHg    SHUNTS  MR Vmax:         415.00 cm/s  Systemic VTI:  0.17 m  MR Vmean:        313.0 cm/s   Systemic Diam: 2.20 cm  MR PISA:         9.05 cm  MR PISA Eff ROA: 84 mm  MR PISA Radius:  1.20 cm   Oswaldo Milian MD  Electronically signed by Oswaldo Milian MD  Signature Date/Time: 04/05/2021/11:26:11 AM    L/RHC    The left ventricular systolic function is normal.   LV end diastolic pressure is mildly elevated.   The left ventricular ejection fraction is 55-65% by visual estimate.   Hemodynamic findings consistent with mild pulmonary hypertension.   There is no aortic valve stenosis.   No angiographically apparent coronary artery disease   Aortic saturation 97%, PA saturation 83%, pulmonary artery pressure 36/18, mean pulmonary artery pressure 27 mmHg, unable to wedge PA catheter despite use of wire.  Cardiac output 9.9 L/min, cardiac index 4.99   No significant coronary artery disease.  Continue with management of  severe mitral regurgitation.     Assessment / Plan:   57 year old female with severe, symptomatic mitral regurgitation. Her biventricular function is normal. No sig CAD. Does have pulmonary hypertension.    Risks/benefits/alternatives of mitral valve repair (95% standard recovery, 4% morbidity [any organ], 1% mortality) were discussed at length and she agreed to proceed. No family present at visit. Decision maker would be relative, not mother.    We discussed that chance of repair is over 90% in posterior leaflet pathology. If we did have to replace, we discussed a biologic valve as she has fibroids that bleed regularly. She understands likely repeat intervention in her lifetime, possibly by catheter in the future would be a possibility. Also discussed when we repair around 1% per year may develop MR. This is less likely in a normal sized ventricle.    I typically do repairs mini-invasively. She does have a BMI >40. I will obtain a CTA CAP to evaluate if the femoral vessels are large enough and for operative planning. It may be that she needs it done by sternotomy  via limited skin incision. We will see.    Plan: Obtain CTA CAP, carotid US Plan for MV repair, LAA clip in January   Lewi Drost H Ellenor Wisniewski 01/02/2022 8:15 PM

## 2022-02-16 NOTE — Anesthesia Procedure Notes (Signed)
Central Venous Catheter Insertion Performed by: Albertha Ghee, MD, anesthesiologist Start/End1/24/2024 7:05 AM, 02/16/2022 7:15 AM Patient location: Pre-op. Preanesthetic checklist: patient identified, IV checked, site marked, risks and benefits discussed, surgical consent, monitors and equipment checked, pre-op evaluation, timeout performed and anesthesia consent Lidocaine 1% used for infiltration and patient sedated Hand hygiene performed  and maximum sterile barriers used  Catheter size: 9 Fr MAC introducer Procedure performed using ultrasound guided technique. Ultrasound Notes:anatomy identified, needle tip was noted to be adjacent to the nerve/plexus identified, no ultrasound evidence of intravascular and/or intraneural injection and image(s) printed for medical record Attempts: 1 Following insertion, line sutured and dressing applied. Post procedure assessment: blood return through all ports, free fluid flow and no air  Patient tolerated the procedure well with no immediate complications.

## 2022-02-16 NOTE — Anesthesia Preprocedure Evaluation (Signed)
Anesthesia Evaluation  Patient identified by MRN, date of birth, ID band Patient awake    Reviewed: Allergy & Precautions, H&P , NPO status , Patient's Chart, lab work & pertinent test results  Airway Mallampati: II   Neck ROM: full    Dental   Pulmonary neg pulmonary ROS   breath sounds clear to auscultation       Cardiovascular hypertension, + Valvular Problems/Murmurs MR  Rhythm:regular Rate:Normal  TTE: EF 60-65%, severe MR, P1/P2 flail with eccentric regurgitant jet.   Neuro/Psych    GI/Hepatic   Endo/Other  Hypothyroidism    Renal/GU      Musculoskeletal  (+) Arthritis ,    Abdominal   Peds  Hematology   Anesthesia Other Findings   Reproductive/Obstetrics                             Anesthesia Physical Anesthesia Plan  ASA: 3  Anesthesia Plan: General   Post-op Pain Management:    Induction: Intravenous  PONV Risk Score and Plan: 3 and Ondansetron, Dexamethasone, Midazolam and Treatment may vary due to age or medical condition  Airway Management Planned: Oral ETT  Additional Equipment: Arterial line, CVP, PA Cath, TEE and Ultrasound Guidance Line Placement  Intra-op Plan:   Post-operative Plan: Post-operative intubation/ventilation  Informed Consent: I have reviewed the patients History and Physical, chart, labs and discussed the procedure including the risks, benefits and alternatives for the proposed anesthesia with the patient or authorized representative who has indicated his/her understanding and acceptance.     Dental advisory given  Plan Discussed with: CRNA, Anesthesiologist and Surgeon  Anesthesia Plan Comments:        Anesthesia Quick Evaluation

## 2022-02-16 NOTE — Op Note (Addendum)
OPERATIVE NOTE: Patient Name: Melissa Riley Date of Birth: 01/10/1966 Date of Operation: 02/16/22  PRE-OPERATIVE DIAGNOSIS: Mitral Regurgitation  POST-OPERATIVE DIAGNOSIS: Same  OPERATION: Mitral Valve Repair  SURGEON: Pierre Bali Mystie Ormand MD   ASSISTANT: Enid Cutter PA  EBL: 100cc  FINDINGS: Mitral regurgitation: Severe preop TEE No MR at all postop  Entire P2 was severely prolapsed Entire P1 was severely prolapsed P3 appeared normal A1 was prolapsed as well when posterior leaflet corrected  Techniques for repair: 68 Physio II ring Neochords to P1 and P2 Anteriolateral commissuroplasty  EF normal before surgery, LV dilation noted.  EF mildy reduced after surgery, expected given MR correction   TIMES: XC:  151 min CPB: 172 min   SPECIMENS None   COMPLICATIONS: None  TUBES:  2 24 Fr blakes (left pleural and mediastinal) 1 JP to Bulb (mediastinal)   PROCEDURE IN DETAIL: The patient was brought in the operating room and laid in supine position.  The patient was prepped and draped in standard fashion.  An arterial, pulmonary arterial and venous lines were placed by anesthesia along with a single-lumen endotracheal tube.  Local analgesia given to the sternum. Sternotomy was performed and the pericardium was opened.  Full dose heparin was given to achieve an ACT of 480 and aortic and bicaval venous cannulas were inserted.The patient was placed on cardiopulmonary bypass and using a cross-clamp and cardiac arrest was achieved with 1.5  L of modified blood [place] cardioplegia antegrade. Topical cooling was used as well on the heart.  The caval tapes were put down. The right and left atrium opened, the left via the superior aspect of the fossa ovalis up to medial to the SVC. Before the left atrium was opened I but while crossclamped and all plegia in, I had put a drain in the RSPV and then unclamped this after the left atrium was opened. We saw the sinus branch  artery and avoided it. Retraction sutures x 3 placed on the left atrial side. Valve evaluated. Findings as above, but in brief the majority of posterior leaflet was prolapsed and there was some A1 prolapse as well. I placed 18 2-0 ethibond sutures around the annulus. I placed two neochords, one to anterio-lateral posterior papillary muscle and one to the posterio-medial papillary muscle. There were placed through P1 side of P1-2 junction and P2 respectively. Annulus sized to a 34 Physio II. This was at 60 min and another 600 retrograde cardioplegia given. This had been directly placed in RA while it was open.   Valve sutures placed through ring and this was secured with Cor-Knot. Following this, I assessed valve competence using cardioplegia flush. It was excellent at A2-P2 and A3-P3. There was a small leak at the anteriolateral commissure. I placed 5-0 C1 Ethibond in this area to close what appeared to be a prolapsed at this point after the posterior leaflet was already fixed. This commissuroplasty was around 38m. I then tied the neochords over a lasso.   Attention was then turned to incisional closure including running closure with prolene of the left and right atrium. Next, the cross-clamp was released  with the root vent on.  After de-airing the heart came back into a junctional rhythm and the heart took over the circulation. Bypass was weaned and cannulas were removed, then protamine was given and hemostasis was achieved. Chest tubes were placed as above along with ventricular and atrial wires.  The chest was then closed in interrupted double steel wire and the presternal  layers were closed in 3 layers of absorbable suture. I placed red rubber pre-sternal retention sutures as well  The patient had a stable status and was transferred to the postoperative care unit on 2 of epinephrine and 2 of Levophed. All surgical counts were correct in a paced rhythm. Underlying was around 40 junctional.

## 2022-02-16 NOTE — Hospital Course (Addendum)
Referring: Lenna Sciara, NP Primary Care: Joycelyn Man, FNP Primary Cardiologist: Evalina Field, MD  Hospital  Course: Ms. Kastens was admitted to the hospital on 02/16/2022 and taken to the OR for elective surgery.  The mitral valve was repaired using a 34 mm Edwards physio 2 annuloplasty ring.  Mitral valve commissuroplasty was also performed.  Following the procedure, she separated from cardiopulmonary bypass without difficulty on epinephrine.  Esophageal mucosa demonstrated trace mitral insufficiency and good biventricular function.  He was transferred to the ICU in stable condition. The vital signs and hemodynamics remained stable.  She was weaned from the ventilator and extubated by 7:30 PM on the day of surgery.  The pleural tubes and monitoring lines were removed on the first postoperative day and she was mobilized.  She was in an accelerated junctional rhythm with a rate in the mid 60s early post-op with eventual recovery of sinus node function. Pacer wires were removed without complication. Supplemental O2 was weaned off without difficulty. She was transferred to 4E Progressive Care on the 4th post-op day. She progressed well with mobility.  Diet was advance and well tolerated with return of normal bowel function. She developed local skin irritation where the sternal retention suture bolsters pressed into the skin. The bolsters were trimmed to minimize pressure and risk of skin erosion.  She was evaluated by physical therapy and occupational therapy for discharge planning. A rollator walker was arranged for discharge per PT recommendations.  At the time of discharge, she was independent with ambulation and transfers. She was tolerating a cardiac diet and Wt was 3lbs below pre-op. She was in a stable NSR with a few PVC's.   Ms. Levandowski developed atrial fibrillation on 02/22/22 while taking a walk after discharge orders placed. VR was 140-160's. The discharge was canceled and orders for amiodarone  loading were placed.  However, by the time a new IV was established for the amiodarone, she had converted back to SR.  We proceeded with the IV amiodarone bolus and oral loading at '400mg'$  po BID.   She had no further atrial fibrillation for about 48 hours. We arranged for a Zio patch to be placed with cardiology follow up with Dr. Farris Has.  She was again being prepared for discharge on 02/24/22 when she reverted back to a-fib with RVR. An additional IV bolus of amiodarone '150mg'$  was given and she quickly converted back to SR. She was also given KCl  68mq for k+ of 3.8.   At this point it was decided she would need to be anticoagulated for stroke prevention in the setting of paroxysmal atrial fibrillation. She was started on Eliquis.  The pharmacy staff assisted with getting prior authorization for this medication as well as financial assistance to cover her co-pay, and bedside instruction regarding the medication.  After the a-fib episode on 2/1, Ms. RManninencontinued to take walks with the mobility staff and continued to maintain SR.  She agreed to remain in the hospital to monitor her cardiac rhythm overnight.

## 2022-02-16 NOTE — Procedures (Signed)
Extubation Procedure Note  Patient Details:   Name: Melissa Riley DOB: 1965-01-29 MRN: [id number]   Airway Documentation:    Vent end date: 02/16/22 Vent end time: 1841   Evaluation  O2 sats: stable throughout Complications: No apparent complications Patient did tolerate procedure well. Bilateral Breath Sounds: Clear   Yes  Esperanza Sheets T 02/16/2022, 7:15 PM

## 2022-02-17 ENCOUNTER — Encounter (HOSPITAL_COMMUNITY): Payer: Self-pay | Admitting: Cardiothoracic Surgery

## 2022-02-17 ENCOUNTER — Inpatient Hospital Stay (HOSPITAL_COMMUNITY): Payer: Commercial Managed Care - HMO

## 2022-02-17 DIAGNOSIS — E039 Hypothyroidism, unspecified: Secondary | ICD-10-CM | POA: Diagnosis not present

## 2022-02-17 DIAGNOSIS — I340 Nonrheumatic mitral (valve) insufficiency: Secondary | ICD-10-CM

## 2022-02-17 DIAGNOSIS — Z9889 Other specified postprocedural states: Secondary | ICD-10-CM | POA: Diagnosis not present

## 2022-02-17 LAB — BASIC METABOLIC PANEL
Anion gap: 10 (ref 5–15)
Anion gap: 6 (ref 5–15)
BUN: 13 mg/dL (ref 6–20)
BUN: 13 mg/dL (ref 6–20)
CO2: 21 mmol/L — ABNORMAL LOW (ref 22–32)
CO2: 22 mmol/L (ref 22–32)
Calcium: 7.7 mg/dL — ABNORMAL LOW (ref 8.9–10.3)
Calcium: 8 mg/dL — ABNORMAL LOW (ref 8.9–10.3)
Chloride: 108 mmol/L (ref 98–111)
Chloride: 107 mmol/L (ref 98–111)
Creatinine, Ser: 0.71 mg/dL (ref 0.44–1.00)
Creatinine, Ser: 0.77 mg/dL (ref 0.44–1.00)
GFR, Estimated: >60 mL/min (ref >60)
GFR, Estimated: >60 mL/min (ref >60)
Glucose, Bld: 126 mg/dL — ABNORMAL HIGH (ref 70–99)
Glucose, Bld: 134 mg/dL — ABNORMAL HIGH (ref 70–99)
Potassium: 4.1 mmol/L (ref 3.5–5.1)
Potassium: 3.7 mmol/L (ref 3.5–5.1)
Sodium: 139 mmol/L (ref 135–145)
Sodium: 135 mmol/L (ref 135–145)

## 2022-02-17 LAB — GLUCOSE, CAPILLARY
Glucose-Capillary: 124 mg/dL — ABNORMAL HIGH (ref 70–99)
Glucose-Capillary: 132 mg/dL — ABNORMAL HIGH (ref 70–99)
Glucose-Capillary: 134 mg/dL — ABNORMAL HIGH (ref 70–99)
Glucose-Capillary: 135 mg/dL — ABNORMAL HIGH (ref 70–99)
Glucose-Capillary: 141 mg/dL — ABNORMAL HIGH (ref 70–99)
Glucose-Capillary: 122 mg/dL — ABNORMAL HIGH (ref 70–99)
Glucose-Capillary: 127 mg/dL — ABNORMAL HIGH (ref 70–99)
Glucose-Capillary: 114 mg/dL — ABNORMAL HIGH (ref 70–99)
Glucose-Capillary: 128 mg/dL — ABNORMAL HIGH (ref 70–99)
Glucose-Capillary: 127 mg/dL — ABNORMAL HIGH (ref 70–99)
Glucose-Capillary: 124 mg/dL — ABNORMAL HIGH (ref 70–99)
Glucose-Capillary: 116 mg/dL — ABNORMAL HIGH (ref 70–99)

## 2022-02-17 LAB — CBC
HCT: 29.5 % — ABNORMAL LOW (ref 36.0–46.0)
HCT: 28.7 % — ABNORMAL LOW (ref 36.0–46.0)
Hemoglobin: 10 g/dL — ABNORMAL LOW (ref 12.0–15.0)
Hemoglobin: 9.8 g/dL — ABNORMAL LOW (ref 12.0–15.0)
MCH: 32.5 pg (ref 26.0–34.0)
MCH: 32.9 pg (ref 26.0–34.0)
MCHC: 33.9 g/dL (ref 30.0–36.0)
MCHC: 34.1 g/dL (ref 30.0–36.0)
MCV: 95.8 fL (ref 80.0–100.0)
MCV: 96.3 fL (ref 80.0–100.0)
Platelets: 113 10*3/uL — ABNORMAL LOW (ref 150–400)
Platelets: 93 10*3/uL — ABNORMAL LOW (ref 150–400)
RBC: 3.08 MIL/uL — ABNORMAL LOW (ref 3.87–5.11)
RBC: 2.98 MIL/uL — ABNORMAL LOW (ref 3.87–5.11)
RDW: 13.2 % (ref 11.5–15.5)
RDW: 13.6 % (ref 11.5–15.5)
WBC: 13.7 10*3/uL — ABNORMAL HIGH (ref 4.0–10.5)
WBC: 13.7 10*3/uL — ABNORMAL HIGH (ref 4.0–10.5)
nRBC: 0 % (ref 0.0–0.2)
nRBC: 0 % (ref 0.0–0.2)

## 2022-02-17 LAB — ECHO INTRAOPERATIVE TEE
Height: 62.5 in
Weight: 3648 oz

## 2022-02-17 LAB — MAGNESIUM
Magnesium: 2.7 mg/dL — ABNORMAL HIGH (ref 1.7–2.4)
Magnesium: 2.4 mg/dL (ref 1.7–2.4)

## 2022-02-17 MED ORDER — FERROUS SULFATE 325 (65 FE) MG PO TABS
325.0000 mg | ORAL_TABLET | Freq: Two times a day (BID) | ORAL | Status: DC
  Administered 2022-02-17 – 2022-02-25 (×16): 325 mg via ORAL
  Filled 2022-02-17 (×15): qty 1

## 2022-02-17 MED ORDER — ENOXAPARIN SODIUM 40 MG/0.4ML IJ SOSY
40.0000 mg | PREFILLED_SYRINGE | Freq: Every day | INTRAMUSCULAR | Status: DC
  Administered 2022-02-17: 40 mg via SUBCUTANEOUS
  Filled 2022-02-17: qty 0.4

## 2022-02-17 MED ORDER — POTASSIUM CHLORIDE 10 MEQ/50ML IV SOLN
10.0000 meq | INTRAVENOUS | Status: AC
  Administered 2022-02-17 (×3): 10 meq via INTRAVENOUS
  Filled 2022-02-17 (×3): qty 50

## 2022-02-17 MED ORDER — INSULIN ASPART 100 UNIT/ML IJ SOLN
0.0000 [IU] | INTRAMUSCULAR | Status: DC
  Administered 2022-02-17 – 2022-02-19 (×5): 2 [IU] via SUBCUTANEOUS

## 2022-02-17 MED ORDER — VITAMIN C 500 MG PO TABS
250.0000 mg | ORAL_TABLET | Freq: Two times a day (BID) | ORAL | Status: DC
  Administered 2022-02-17 – 2022-02-25 (×17): 250 mg via ORAL
  Filled 2022-02-17 (×17): qty 1

## 2022-02-17 NOTE — Progress Notes (Signed)
Patient ID: Melissa Riley, female   DOB: [birth year], 57 y.o.   MRN: [id number] [facility] Evening Rounds:  Hemodynamically stable in junctional rhythm 68.  UO ok  Sats 95% 4L.  Ambulated small loop today.  BMET pending this pm.

## 2022-02-17 NOTE — Discharge Summary (Incomplete Revision)
Physician Discharge Summary  Patient ID: Melissa Riley MRN: [id number] DOB/AGE: Jun 06, 1965 57 y.o.  Admit date: 02/16/2022 Discharge date: 02/25/2022  Admission Diagnoses:  Severe mitral insufficiency Hypertension  Discharge Diagnoses:   Severe mitral insufficiency Hypertension Expected acute blood loss anemia S/P mitral valve repair Acute respiratory failure Junctional heart rhythm Post operative paroxysmal atrial fibrillation  Discharged Condition: stable  Referring: Melissa Sciara, NP Primary Care: Melissa Man, FNP Primary Cardiologist: Melissa Field, MD  History of Present Illness:    Melissa Riley is a 57 y.o. female presents for evaluation of severe mitral regurgitation. She reports she can get short of breath with some activity.    She reports no chest pain.  EKG shows sinus rhythm with LVH.   She Riley severe prolapse of the posterior leaflet with possible flail segment. She is also seeing medical providers for bleeding fibroids. Changing pad 1-2 x day.   By cath no sig CAD. Pap 36/18 - elevated. Mean PA 27. Report possible palpitations in past but no definite history.  Her biventricular function is normal. No sig CAD. Does have pulmonary hypertension.    Risks/benefits/alternatives of mitral valve repair (95% standard recovery, 4% morbidity [any organ], 1% mortality) were discussed at length and she agreed to proceed. No family present at visit. Decision maker would be relative, not mother.    We discussed that chance of repair is over 90% in posterior leaflet pathology. If we did have to replace, we discussed a biologic valve as she Riley fibroids that bleed regularly. She understands likely repeat intervention in her lifetime, possibly by catheter in the future would be a possibility. Also discussed when we repair around 1% per year may develop MR. This is less likely in a normal sized ventricle.    Hospital  Course: Melissa Riley was admitted to the hospital on  02/16/2022 and taken to the OR for elective surgery.  The mitral valve was repaired using a 34 mm Melissa Riley 2 annuloplasty ring.  Mitral valve commissuroplasty was also performed.  Following the procedure, she separated from cardiopulmonary bypass without difficulty on epinephrine.  Esophageal mucosa demonstrated trace mitral insufficiency and good biventricular function.  He was transferred to the ICU in stable condition. The vital signs and hemodynamics remained stable.  She was weaned from the ventilator and extubated by 7:30 PM on the day of surgery.  The pleural tubes and monitoring lines were removed on the first postoperative day and she was mobilized.  She was in an accelerated junctional rhythm with a rate in the mid 60s early post-op with eventual recovery of sinus node function. Pacer wires were removed without complication. Supplemental O2 was weaned off without difficulty. She was transferred to 4E Progressive Care on the 4th post-op day. She progressed well with mobility.  Diet was advance and well tolerated with return of normal bowel function. She developed local skin irritation where the sternal retention suture bolsters pressed into the skin. The bolsters were trimmed to minimize pressure and risk of skin erosion.  She was evaluated by physical therapy and occupational therapy for discharge planning. A rollator walker was arranged for discharge per PT recommendations.  At the time of discharge, she was independent with ambulation and transfers. She was tolerating a cardiac diet and Wt was 3lbs below pre-op. She was in a stable NSR with a few PVC's.   Melissa Riley developed atrial fibrillation on 02/22/22 while taking a walk after discharge orders placed. VR was 140-160's. The  discharge was canceled and orders for amiodarone loading were placed.  However, by the time a new IV was established for the amiodarone, she had converted back to SR.  We proceeded with the IV amiodarone bolus and oral  loading at '400mg'$  po BID.   She had no further atrial fibrillation for about 48 hours. We arranged for a Zio patch to be placed with cardiology follow up with Melissa Riley.  She was again being prepared for discharge on 02/24/22 when she reverted back to a-fib with RVR. An additional IV bolus of amiodarone '150mg'$  was given and she quickly converted back to SR. She was also given KCl  97mq for k+ of 3.8.   At this point it was decided she would need to be anticoagulated for stroke prevention in the setting of paroxysmal atrial fibrillation. She was started on Eliquis.  The pharmacy staff assisted with getting prior authorization for this medication as well as financial assistance to cover her co-pay and bedside instruction regarding the medication.  After the a-fib episode on 2/1, Ms. RWhitakercontinued to take walks with the mobility staff and continued to maintain SR.  She agreed to remain in the hospital to monitor her cardiac rhythm overnight.      Consults: pulmonary/intensive care  Significant Diagnostic Studies:   CLINICAL DATA:  Follow-up mitral valve repair   EXAM: CHEST - 2 VIEW   COMPARISON:  02/18/2022   FINDINGS: Previous median sternotomy and mitral valve replacement. Chronic cardiomegaly. Resolution of previously seen mild edema. The lungs are well aerated. There is mild basilar atelectasis with small bilateral pleural effusions.   IMPRESSION: Resolution of previously seen mild edema. Small bilateral pleural effusions with mild basilar atelectasis.   Electronically Signed   By: MNelson ChimesM.D.   On: 02/21/2022 10:19   Treatments: Surgery  OPERATIVE NOTE: Patient Name: Melissa CADENASDate of Birth: [birth year]Date of Operation: 02/16/22   PRE-OPERATIVE DIAGNOSIS: Mitral Regurgitation   POST-OPERATIVE DIAGNOSIS: Same   OPERATION: Mitral Valve Repair   SURGEON: Melissa BaliEnter MD   ASSISTANT: Melissa CutterPA   EBL: 100cc   FINDINGS: Mitral  regurgitation: Severe preop TEE No MR at all postop   Entire P2 was severely prolapsed Entire P1 was severely prolapsed P3 appeared normal A1 was prolapsed as well when posterior leaflet corrected   Techniques for repair: 335Physio II ring Neochords to P1 and P2 Anteriolateral commissuroplasty   EF normal before surgery, LV dilation noted.  EF mildy reduced after surgery, expected given MR correction   TIMES: XC:  151 min CPB: 172 min   SPECIMENS None   COMPLICATIONS: None   TUBES:  2 24 Fr blakes (left pleural and mediastinal) 1 JP to Bulb (mediastinal)   Discharge Exam: Blood pressure 114/64, pulse (!) 143, temperature 98.3 F (36.8 C), temperature source Oral, resp. rate 14, height '5\' 3"'$  (1.6 m), weight 99.5 kg, SpO2 100 %.  ***   Disposition:  Discharged to home in stable condition.  Discharge Instructions     Amb Referral to Cardiac Rehabilitation   Complete by: As directed    Diagnosis: Valve Repair   Valve: Mitral   After initial evaluation and assessments completed: Virtual Based Care may be provided alone or in conjunction with Phase 2 Cardiac Rehab based on patient barriers.: Yes   Intensive Cardiac Rehabilitation (ICR) MPeridotlocation only OR Traditional Cardiac Rehabilitation (TCR) *If criteria for ICR are not met will enroll in TCR ([facility] Yes  Allergies as of 02/24/2022       Reactions   Amoxil [amoxicillin] Other (See Comments)   Yeast infection        Medication List     STOP taking these medications    lisinopril 40 MG tablet Commonly known as: ZESTRIL       TAKE these medications    amiodarone 200 MG tablet Commonly known as: PACERONE Take 2 tablets (400 mg total) by mouth 2 (two) times daily for 6 days THEN take 1 tablet (200 mg total) twice daily thereafter.   amLODipine 5 MG tablet Commonly known as: NORVASC Take 5 mg by mouth daily.   apixaban 5 MG Tabs tablet Commonly known as: ELIQUIS Take 1 tablet (5 mg  total) by mouth 2 (two) times daily.   ferrous sulfate 324 MG Tbec Take 324 mg by mouth 2 (two) times daily.   hydrochlorothiazide 25 MG tablet Commonly known as: HYDRODIURIL Take 25 mg by mouth daily.   hydrocortisone cream 1 % Apply topically 3 (three) times daily as needed for itching.   levothyroxine 25 MCG tablet Commonly known as: SYNTHROID Take 25 mcg by mouth daily before breakfast.   megestrol 40 MG tablet Commonly known as: MEGACE Take 1 tablet (40 mg total) by mouth 2 (two) times daily.   metoprolol succinate 25 MG 24 hr tablet Commonly known as: Toprol XL Take 1 tablet (25 mg total) by mouth daily.   traMADol 50 MG tablet Commonly known as: ULTRAM Take 1 tablet (50 mg total) by mouth every 6 (six) hours as needed for up to 7 days for moderate pain.   VITAMIN C PO Take 1 tablet by mouth daily.   Vitamin D3 25 MCG (1000 UT) Chew Chew by mouth daily. 2 gummies daily               Durable Medical Equipment  (From admission, onward)           Start     Ordered   02/22/22 0925  For home use only DME 4 wheeled rolling walker with seat  Once       Question:  Patient needs a walker to treat with the following condition  Answer:  Imbalance   02/21/22 0926   02/21/22 0915  For home use only DME 4 wheeled rolling walker with seat  Once       Question:  Patient needs a walker to treat with the following condition  Answer:  Physical deconditioning   02/21/22 0915            Follow-up Information     Enter, Melissa Bali, MD. Go on 03/10/2022.   Specialties: Cardiothoracic Surgery, Cardiology Why: Your appontment is at 12:30pm.  Please obtain a chest x-ray at [facility] located at [place]. 1 hour prior to the appointment. Contact information: [address] [place] [address] [place], [place], NP. Go on 03/14/2022.   Specialties: Cardiology, Family Medicine Why: Your appointment is at  10:05am. Contact information: [place] [address] [place]. Go on 03/30/2022.   Specialty: Cardiology Why: Your appointment for a follow up echocardiogram is at 10:35am. Contact information: [address] [id number] [place] [address] [phone]        Geralynn Rile, MD. Go on 04/08/2022.   Specialties: Cardiology,  Internal Medicine, Radiology Why: Your appointment is at 8:30am. Contact information: [place] [address] [phone]                 The patient Riley been discharged on:   1.Beta Blocker:  Yes [ x  ]                              No   [   ]                              If No, reason:  2.Ace Inhibitor/ARB: Yes [   ]                                     No  [  x  ]                                     If No, reason: Plan to resume as outpatient as BP allows.  3.Statin:   Yes [   ]                  No  [ x  ]                  If No, reason: No coronary artery disease  4.S[place]a [place]:  Yes  [  ]                  No   [   ]                  If No, reason: Not indicated, on apixaban  5. ACS on Admission?  P2Y12 Inhibitor:  Yes  [   ]                                No  [ x ]    Signed: Antony Odea, PA-C 02/24/2022, 3:49 PM

## 2022-02-17 NOTE — Anesthesia Postprocedure Evaluation (Signed)
Anesthesia Post Note  Patient: Melissa Riley  Procedure(s) Performed: MITRAL VALVE REPAIR (MVR) USING 34 MM EDWARDS PHYSIO II ANNULOPLASTY RING (Chest) TRANSESOPHAGEAL ECHOCARDIOGRAM (TEE)     Patient location during evaluation: SICU Anesthesia Type: General Level of consciousness: sedated Pain management: pain level controlled Vital Signs Assessment: post-procedure vital signs reviewed and stable Respiratory status: patient remains intubated per anesthesia plan Cardiovascular status: stable Postop Assessment: no apparent nausea or vomiting Anesthetic complications: no   No notable events documented.  Last Vitals:  Vitals:   02/17/22 0600 02/17/22 0700  BP: 113/61 123/64  Pulse: 69 67  Resp: 20 (!) 29  Temp: 37.6 C 37.6 C  SpO2: 96% 97%    Last Pain:  Vitals:   02/17/22 0452  TempSrc:   PainSc: [place]

## 2022-02-17 NOTE — Progress Notes (Signed)
TCTS Progress Note: 1 Day Post-Op Procedure(s) (LRB): MITRAL VALVE REPAIR (MVR) USING 34 MM EDWARDS PHYSIO II ANNULOPLASTY RING (N/A) TRANSESOPHAGEAL ECHOCARDIOGRAM (TEE) (N/A)  LOS: 1 day   Alert, conversational Appears to be in accelerated junctional  Plan DC 2 Cts connected to pleurovac Leave bulb in place Dc swan SQH Wean epi to 1 now and then off at noon  Likely transfer to floor tomorrow if no need for pacing overnight For anticoagulation, plan on ASA alone.  If she has significant AF, would start on Eliquis POD day 6      Latest Ref Rng & Units 02/17/2022    4:39 AM 02/16/2022    8:16 PM 02/16/2022    6:30 PM  CBC  WBC 4.0 - 10.5 K/uL 13.7  15.8    Hemoglobin 12.0 - 15.0 g/dL 10.0  10.5    9.5  9.9   Hematocrit 36.0 - 46.0 % 29.5  30.4    28.0  29.0   Platelets 150 - 400 K/uL 113  116         Latest Ref Rng & Units 02/17/2022    4:39 AM 02/16/2022    8:16 PM 02/16/2022    6:30 PM  CMP  Glucose 70 - 99 mg/dL 126  141    BUN 6 - 20 mg/dL 13  13    Creatinine 0.44 - 1.00 mg/dL 0.71  0.82    Sodium 135 - 145 mmol/L 139  140    141  140   Potassium 3.5 - 5.1 mmol/L 4.1  3.9    3.9  4.3   Chloride 98 - 111 mmol/L 108  109    CO2 22 - 32 mmol/L 21  19    Calcium 8.9 - 10.3 mg/dL 7.7  7.3      ABG    Component Value Date/Time   PHART 7.398 02/16/2022 2016   PCO2ART 28.6 (L) 02/16/2022 2016   PO2ART 98 02/16/2022 2016   HCO3 17.6 (L) 02/16/2022 2016   TCO2 19 (L) 02/16/2022 2016   ACIDBASEDEF 6.0 (H) 02/16/2022 2016   O2SAT 98 02/16/2022 2016    Vent Mode: SIMV;PRVC;PSV FiO2 (%):  [40 %-50 %] 40 % Set Rate:  [4 bmp-16 bmp] 4 bmp Vt Set:  [420 mL] 420 mL PEEP:  [5 cmH20] 5 cmH20 Pressure Support:  [10 cmH20] 10 cmH20 Plateau Pressure:  [14 cmH20] 14 cmH20

## 2022-02-17 NOTE — Progress Notes (Signed)
  Transition of Care [facility]) Screening Note   Patient Details  Name: Melissa Riley Date of Birth: 07-07-1965   Transition of Care [facility]) CM/SW Contact:    Milas Gain, [place] Phone Number: 02/17/2022, 3:23 PM    Transition of Care Department [facility]) has reviewed patient and no TOC needs have been identified at this time. We will continue to monitor patient advancement through interdisciplinary progression rounds. If new patient transition needs arise, please place a TOC consult.

## 2022-02-17 NOTE — Discharge Instructions (Addendum)
Discharge Instructions:  1. You may shower, please wash incisions daily with soap and water and keep dry.  If you wish to cover wounds with dressing you may do so but please keep clean and change daily.  No tub baths or swimming until incisions have completely healed.  If your incisions become red or develop any drainage please call our office at [phone]  2. No Driving until cleared by Dr. Binnie Kand office and you are no longer using narcotic pain medications  3. Monitor your weight daily.. Please use the same scale and weigh at same time... If you gain 5-10 lbs in 48 hours with associated lower extremity swelling, please contact our office at [phone]  4. Fever of 101.5 for at least 24 hours with no source, please contact our office at [phone]  5. Activity- up as tolerated, please walk at least 3 times per day.  Avoid strenuous activity, no lifting, pushing, or pulling with your arms over 8-10 lbs for a minimum of 6 weeks  6. If any questions or concerns arise, please do not hesitate to contact our office at [phone]  ___________________________________________________________________________________________________________________________ Information on my medicine - ELIQUIS (apixaban)  This medication education was reviewed with me or my healthcare representative as part of my discharge preparation.  The pharmacist that spoke with me during my hospital stay was:  Kaleen Mask, [facility]  Why was Eliquis prescribed for you? Eliquis was prescribed for you to reduce the risk of a blood clot forming that can cause a stroke if you have a medical condition called atrial fibrillation (a type of irregular heartbeat).  What do You need to know about Eliquis ? Take your Eliquis TWICE DAILY - one tablet in the morning and one tablet in the evening with or without food. If you have difficulty swallowing the tablet whole please discuss with your pharmacist how to take the medication  safely.  Take Eliquis exactly as prescribed by your doctor and DO NOT stop taking Eliquis without talking to the doctor who prescribed the medication.  Stopping may increase your risk of developing a stroke.  Refill your prescription before you run out.  After discharge, you should have regular check-up appointments with your healthcare provider that is prescribing your Eliquis.  In the future your dose may need to be changed if your kidney function or weight changes by a significant amount or as you get older.  What do you do if you miss a dose? If you miss a dose, take it as soon as you remember on the same day and resume taking twice daily.  Do not take more than one dose of ELIQUIS at the same time to make up a missed dose.  Important Safety Information A possible side effect of Eliquis is bleeding. You should call your healthcare provider right away if you experience any of the following: Bleeding from an injury or your nose that does not stop. Unusual colored urine (red or dark brown) or unusual colored stools (red or black). Unusual bruising for unknown reasons. A serious fall or if you hit your head (even if there is no bleeding).  Some medicines may interact with Eliquis and might increase your risk of bleeding or clotting while on Eliquis. To help avoid this, consult your healthcare provider or pharmacist prior to using any new prescription or non-prescription medications, including herbals, vitamins, non-steroidal anti-inflammatory drugs (NSAIDs) and supplements.  This website has more information on Eliquis (apixaban): http://www.eliquis.com/eliquis/home

## 2022-02-17 NOTE — Discharge Summary (Addendum)
Physician Discharge Summary  Patient ID: Melissa Riley MRN: [id number] DOB/AGE: Jun 06, 1965 57 y.o.  Admit date: 02/16/2022 Discharge date: 02/25/2022  Admission Diagnoses:  Severe mitral insufficiency Hypertension  Discharge Diagnoses:   Severe mitral insufficiency Hypertension Expected acute blood loss anemia S/P mitral valve repair Acute respiratory failure Junctional heart rhythm Post operative paroxysmal atrial fibrillation  Discharged Condition: stable  Referring: Lenna Sciara, NP Primary Care: Joycelyn Man, FNP Primary Cardiologist: Evalina Field, MD  History of Present Illness:    Melissa Riley is a 57 y.o. female presents for evaluation of severe mitral regurgitation. She reports she can get short of breath with some activity.    She reports no chest pain.  EKG shows sinus rhythm with LVH.   She has severe prolapse of the posterior leaflet with possible flail segment. She is also seeing medical providers for bleeding fibroids. Changing pad 1-2 x day.   By cath no sig CAD. Pap 36/18 - elevated. Mean PA 27. Report possible palpitations in past but no definite history.  Her biventricular function is normal. No sig CAD. Does have pulmonary hypertension.    Risks/benefits/alternatives of mitral valve repair (95% standard recovery, 4% morbidity [any organ], 1% mortality) were discussed at length and she agreed to proceed. No family present at visit. Decision maker would be relative, not mother.    We discussed that chance of repair is over 90% in posterior leaflet pathology. If we did have to replace, we discussed a biologic valve as she has fibroids that bleed regularly. She understands likely repeat intervention in her lifetime, possibly by catheter in the future would be a possibility. Also discussed when we repair around 1% per year may develop MR. This is less likely in a normal sized ventricle.    Hospital  Course: Melissa Riley was admitted to the hospital on  02/16/2022 and taken to the OR for elective surgery.  The mitral valve was repaired using a 34 mm Edwards physio 2 annuloplasty ring.  Mitral valve commissuroplasty was also performed.  Following the procedure, she separated from cardiopulmonary bypass without difficulty on epinephrine.  Esophageal mucosa demonstrated trace mitral insufficiency and good biventricular function.  He was transferred to the ICU in stable condition. The vital signs and hemodynamics remained stable.  She was weaned from the ventilator and extubated by 7:30 PM on the day of surgery.  The pleural tubes and monitoring lines were removed on the first postoperative day and she was mobilized.  She was in an accelerated junctional rhythm with a rate in the mid 60s early post-op with eventual recovery of sinus node function. Pacer wires were removed without complication. Supplemental O2 was weaned off without difficulty. She was transferred to 4E Progressive Care on the 4th post-op day. She progressed well with mobility.  Diet was advance and well tolerated with return of normal bowel function. She developed local skin irritation where the sternal retention suture bolsters pressed into the skin. The bolsters were trimmed to minimize pressure and risk of skin erosion.  She was evaluated by physical therapy and occupational therapy for discharge planning. A rollator walker was arranged for discharge per PT recommendations.  At the time of discharge, she was independent with ambulation and transfers. She was tolerating a cardiac diet and Wt was 3lbs below pre-op. She was in a stable NSR with a few PVC's.   Melissa Riley developed atrial fibrillation on 02/22/22 while taking a walk after discharge orders placed. VR was 140-160's. The  discharge was canceled and orders for amiodarone loading were placed.  However, by the time a new IV was established for the amiodarone, she had converted back to SR.  We proceeded with the IV amiodarone bolus and oral  loading at 432m po BID.   She had no further atrial fibrillation for about 48 hours. We arranged for a Zio patch to be placed with cardiology follow up with Dr. OFarris Has  She was again being prepared for discharge on 02/24/22 when she reverted back to a-fib with RVR. An additional IV bolus of amiodarone 1546mwas given and she quickly converted back to SR. She was also given KCl  4040mfor k+ of 3.8.   At this point it was decided she would need to be anticoagulated for stroke prevention in the setting of paroxysmal atrial fibrillation. She was started on Eliquis.  The pharmacy staff assisted with getting prior authorization for this medication as well as financial assistance to cover her co-pay and bedside instruction regarding the medication.  After the a-fib episode on 2/1, Melissa Riley to take walks with the mobility staff and continued to maintain SR.  She agreed to remain in the hospital to monitor her cardiac rhythm overnight.   She remained clinically stable overnight.  She is medically stable for discharge home today.    Consults: pulmonary/intensive care  Significant Diagnostic Studies:   CLINICAL DATA:  Follow-up mitral valve repair   EXAM: CHEST - 2 VIEW   COMPARISON:  02/18/2022   FINDINGS: Previous median sternotomy and mitral valve replacement. Chronic cardiomegaly. Resolution of previously seen mild edema. The lungs are well aerated. There is mild basilar atelectasis with small bilateral pleural effusions.   IMPRESSION: Resolution of previously seen mild edema. Small bilateral pleural effusions with mild basilar atelectasis.   Electronically Signed   By: MarNelson ChimesD.   On: 02/21/2022 10:19   Treatments: Surgery  OPERATIVE NOTE: Patient Name: Melissa WALDRIDGEte of Birth: 12/1965/06/04te of Operation: 02/16/22   PRE-OPERATIVE DIAGNOSIS: Mitral Regurgitation   POST-OPERATIVE DIAGNOSIS: Same   OPERATION: Mitral Valve Repair   SURGEON: DanPierre Balinter MD   ASSISTANT: MyrEnid Cutter   EBL: 100cc   FINDINGS: Mitral regurgitation: Severe preop TEE No MR at all postop   Entire P2 was severely prolapsed Entire P1 was severely prolapsed P3 appeared normal A1 was prolapsed as well when posterior leaflet corrected   Techniques for repair: 34 22ysio II ring Neochords to P1 and P2 Anteriolateral commissuroplasty   EF normal before surgery, LV dilation noted.  EF mildy reduced after surgery, expected given MR correction   TIMES: XC:  151 min CPB: 172 min   SPECIMENS None   COMPLICATIONS: None   TUBES:  2 24 Fr blakes (left pleural and mediastinal) 1 JP to Bulb (mediastinal)   Discharge Exam:  Blood pressure 114/64, pulse (!) 143, temperature 98.3 F (36.8 C), temperature source Oral, resp. rate 14, height 5' 3"$  (1.6 m), weight 99.5 kg, SpO2 100 %. General appearance: alert, cooperative, and no distress Heart: regular rate and rhythm Lungs: clear to auscultation bilaterally Abdomen: soft, non-tender; bowel sounds normal; no masses,  no organomegaly Extremities: edema trace Wound: clean and dry  Disposition:  Discharged to home in stable condition.  Discharge Instructions     Amb Referral to Cardiac Rehabilitation   Complete by: As directed    Diagnosis: Valve Repair   Valve: Mitral   After initial evaluation and assessments completed: Virtual Based  Care may be provided alone or in conjunction with Phase 2 Cardiac Rehab based on patient barriers.: Yes   Intensive Cardiac Rehabilitation (ICR) [place] location only OR Traditional Cardiac Rehabilitation (TCR) *If criteria for ICR are not met will enroll in TCR [facility] only): Yes      Allergies as of 02/24/2022       Reactions   Amoxil [amoxicillin] Other (See Comments)   Yeast infection        Medication List     STOP taking these medications    lisinopril 40 MG tablet Commonly known as: ZESTRIL       TAKE these medications    amiodarone  200 MG tablet Commonly known as: PACERONE Take 2 tablets (400 mg total) by mouth 2 (two) times daily for 6 days THEN take 1 tablet (200 mg total) twice daily thereafter.   amLODipine 5 MG tablet Commonly known as: NORVASC Take 5 mg by mouth daily.   apixaban 5 MG Tabs tablet Commonly known as: ELIQUIS Take 1 tablet (5 mg total) by mouth 2 (two) times daily.   ferrous sulfate 324 MG Tbec Take 324 mg by mouth 2 (two) times daily.   hydrochlorothiazide 25 MG tablet Commonly known as: HYDRODIURIL Take 25 mg by mouth daily.   hydrocortisone cream 1 % Apply topically 3 (three) times daily as needed for itching.   levothyroxine 25 MCG tablet Commonly known as: SYNTHROID Take 25 mcg by mouth daily before breakfast.   megestrol 40 MG tablet Commonly known as: MEGACE Take 1 tablet (40 mg total) by mouth 2 (two) times daily.   metoprolol succinate 25 MG 24 hr tablet Commonly known as: Toprol XL Take 1 tablet (25 mg total) by mouth daily.   traMADol 50 MG tablet Commonly known as: ULTRAM Take 1 tablet (50 mg total) by mouth every 6 (six) hours as needed for up to 7 days for moderate pain.   VITAMIN C PO Take 1 tablet by mouth daily.   Vitamin D3 25 MCG (1000 UT) Chew Chew by mouth daily. 2 gummies daily               Durable Medical Equipment  (From admission, onward)           Start     Ordered   02/22/22 0925  For home use only DME 4 wheeled rolling walker with seat  Once       Question:  Patient needs a walker to treat with the following condition  Answer:  Imbalance   02/21/22 0926   02/21/22 0915  For home use only DME 4 wheeled rolling walker with seat  Once       Question:  Patient needs a walker to treat with the following condition  Answer:  Physical deconditioning   02/21/22 0915            Follow-up Information     Enter, Pierre Bali, MD. Go on 03/10/2022.   Specialties: Cardiothoracic Surgery, Cardiology Why: Your appontment is at 12:30pm.   Please obtain a chest x-ray at [facility] located at [place]. 1 hour prior to the appointment. Contact information: [address] [place] [address] [place], Nenzel, NP. Go on 03/14/2022.   Specialties: Cardiology, Family Medicine Why: Your appointment is at 10:05am. Contact information: [address] [place] [address] [place]  [facility]. Go on 03/30/2022.   Specialty: Cardiology Why: Your appointment for a follow up echocardiogram is at 10:35am. Contact information: [address] [id number] [place] [id number] [phone]        Geralynn Rile, MD. Go on 04/08/2022.   Specialties: Cardiology, Internal Medicine, Radiology Why: Your appointment is at 8:30am. Contact information: [place] [address] [phone]                 The patient has been discharged on:   1.Beta Blocker:  Yes [ x  ]                              No   [   ]                              If No, reason:  2.Ace Inhibitor/ARB: Yes [   ]                                     No  [  x  ]                                     If No, reason: Plan to resume as outpatient as BP allows.  3.Statin:   Yes [   ]                  No  [ x  ]                  If No, reason: No coronary artery disease  4.[place]:  Yes  [  ]                  No   [ x  ]                  If No, reason: Not indicated, on apixaban  5. ACS on Admission?  P2Y12 Inhibitor:  Yes  [   ]                                No  [ x ]    Signed: Antony Odea, PA-C 02/24/2022, 3:49 PM

## 2022-02-17 NOTE — Progress Notes (Signed)
NAME:  Melissa Riley, MRN:  [id number], DOB:  Apr 22, 1965, LOS: 1 ADMISSION DATE:  02/16/2022, CONSULTATION DATE:  1/24 REFERRING MD:  Dr. Tenny Craw, CHIEF COMPLAINT:  MVR   History of Present Illness:  Patient is a 57 year old female with pertinent PMH severe mitral regurgitation, HTN, hypothyroidism presents to [facility] on 1/24 for MVR.  Patient seen on 12/2021 by cardiology.  Most recent echo showing severe MVR.  Most recent heart cath without any significant CAD.  Referred to CT surgery for consideration of mitral valve replacement.  On 1/24 MVR performed.  Postop intubated and transferred to [facility] ICU.  CT in place.  On minimal epi and levo.  PCCM consulted.  Pertinent  Medical History   Past Medical History:  Diagnosis Date   Arthritis    Heart murmur    Hypertension    Hypothyroidism    Mitral regurgitation    Uterine fibroid      Significant Hospital Events: Including procedures, antibiotic start and stop dates in addition to other pertinent events   1/24 s/p MVR; post op intubated; pccm consulted  Interim History / Subjective:  Extubated post-op. Having pain today. Denies other complaints.  Remains on low dose epi.  Objective   Blood pressure 123/64, pulse 67, temperature 99.7 F (37.6 C), resp. rate (!) 29, height '5\' 3"'$  (1.6 m), weight 108.8 kg, SpO2 97 %. PAP: (27-48)/(12-22) 39/16 CO:  [3.3 L/min-6.9 L/min] 5.5 L/min CI:  [1.7 L/min/m2-3.4 L/min/m2] 2.7 L/min/m2  Vent Mode: SIMV;PRVC;PSV FiO2 (%):  [40 %-50 %] 40 % Set Rate:  [4 bmp-16 bmp] 4 bmp Vt Set:  [420 mL] 420 mL PEEP:  [5 cmH20] 5 cmH20 Pressure Support:  [10 cmH20] 10 cmH20 Plateau Pressure:  [14 cmH20] 14 cmH20   Intake/Output Summary (Last 24 hours) at 02/17/2022 0731 Last data filed at 02/17/2022 0700 Gross per 24 hour  Intake 4701.86 ml  Output 3233 ml  Net 1468.86 ml    Filed Weights   02/16/22 0544 02/17/22 0600  Weight: 103.4 kg 108.8 kg    Examination: General:  middle aged woman lying in  bed in NAD HEENT:  [place]/AT, eyes anicteric Neuro: awake and alert, moving all extremities CV: S1S2, RRR. Bulb drain with minimal bloody output.  PULM: breathing comfortably on [place], distant lung sounds GI: soft, NT, ND Extremities: mild peripheral edema, no cyanosis Skin: warm, dry, no rashes. Sternal incision covered.   BUN 13 Cr 0.71 WBC 13.7 H/H 10/29.5 Platelets 113 CXR personally reviewed> atelectasis, swan catheter EKG personally reviewed>junctional rhythm, inferior TWI w/o ST segment changes.   Resolved Hospital Problem list     Assessment & Plan:   Post- op vent management- expected post-op -pulmonary hygiene, OOB mobility -wean O2 as able   Severe MR, s/p MVR HTN -post-op care per TCTS; keep bulb drain in place -tele wires pet TCTS- keep until off pressors -wean epinephrine this morning -complete post-op antibiotics -progress mobility today -full liquid diet today -pain control per protocol- oxycodone, tramadol, morphine -aspirin -start metoprolol once off pressors  HTN -hold PTA HCTZ  Iron deficiency anemia -can resume PTA iron -transfuse for Hb <7 or hemodynamically significant bleeding  Hypothyroidism -con't PTA synthroid  Uterine fibroid; had abnormal imaging by PCP -Hold PTA megace for now-- this med has a high risk of causing weight gain and DVTs. Would wait to resume until she is more mobile.  -likely needs OP Gyn follow up  Best Practice (right click and "Reselect all SmartList Selections" daily)  Diet/type: full liquids  DVT prophylaxis: SCD GI prophylaxis: PPI Lines: Central line and Arterial Line Foley:  Yes, and it is still needed Code Status:  full code Last date of multidisciplinary goals of care discussion [per primary]  Labs   CBC: Recent Labs  Lab 02/14/22 0916 02/16/22 0809 02/16/22 1146 02/16/22 1229 02/16/22 1232 02/16/22 1448 02/16/22 1830 02/16/22 2016 02/17/22 0439  WBC 7.1  --   --   --   --  21.1*  --  15.8*  13.7*  HGB 13.8   < > 8.8*   < > 9.9* 11.2*  10.9* 9.9* 10.5*  9.5* 10.0*  HCT 42.2   < > 25.9*   < > 29.0* 33.0*  33.8* 29.0* 30.4*  28.0* 29.5*  MCV 96.3  --   --   --   --  98.8  --  96.2 95.8  PLT 252  --  144*  --   --  142*  --  116* 113*   < > = values in this interval not displayed.     Basic Metabolic Panel: Recent Labs  Lab 02/14/22 0916 02/16/22 0809 02/16/22 1048 02/16/22 1119 02/16/22 1145 02/16/22 1229 02/16/22 1232 02/16/22 1448 02/16/22 1830 02/16/22 2016 02/17/22 0439  NA 139   < > 143   < > 140 140 141 141 140 140  141 139  K 3.8   < > 4.0   < > 4.2 3.6 3.6 3.4* 4.3 3.9  3.9 4.1  CL 108   < > 103  --  105 105  --   --   --  109 108  CO2 20*  --   --   --   --   --   --   --   --  19* 21*  GLUCOSE 103*   < > 127*  --  119* 153*  --   --   --  141* 126*  BUN 12   < > 15  --  15 16  --   --   --  13 13  CREATININE 0.72   < > 0.60  --  0.50 0.50  --   --   --  0.82 0.71  CALCIUM 9.3  --   --   --   --   --   --   --   --  7.3* 7.7*  MG  --   --   --   --   --   --   --   --   --  3.3* 2.7*   < > = values in this interval not displayed.    GFR: Estimated Creatinine Clearance: 94.1 mL/min (by C-G formula based on SCr of 0.71 mg/dL). Recent Labs  Lab 02/14/22 0916 02/16/22 1448 02/16/22 2016 02/17/22 0439  WBC 7.1 21.1* 15.8* 13.7*       Critical care time:     This patient is critically ill with multiple organ system failure which requires frequent high complexity decision making, assessment, support, evaluation, and titration of therapies. This was completed through the application of advanced monitoring technologies and extensive interpretation of multiple databases. During this encounter critical care time was devoted to patient care services described in this note for 36 minutes.  Julian Hy, DO 02/17/22 10:48 AM [facility]  For contact information, see Amion. If no response to pager, please call PCCM consult  pager. After hours, 7PM- 7AM, please call Elink.

## 2022-02-18 ENCOUNTER — Inpatient Hospital Stay (HOSPITAL_COMMUNITY): Payer: Commercial Managed Care - HMO

## 2022-02-18 DIAGNOSIS — I340 Nonrheumatic mitral (valve) insufficiency: Secondary | ICD-10-CM | POA: Diagnosis not present

## 2022-02-18 LAB — BASIC METABOLIC PANEL
Anion gap: 6 (ref 5–15)
BUN: 10 mg/dL (ref 6–20)
CO2: 23 mmol/L (ref 22–32)
Calcium: 8.1 mg/dL — ABNORMAL LOW (ref 8.9–10.3)
Chloride: 105 mmol/L (ref 98–111)
Creatinine, Ser: 0.57 mg/dL (ref 0.44–1.00)
GFR, Estimated: >60 mL/min (ref >60)
Glucose, Bld: 114 mg/dL — ABNORMAL HIGH (ref 70–99)
Potassium: 4 mmol/L (ref 3.5–5.1)
Sodium: 134 mmol/L — ABNORMAL LOW (ref 135–145)

## 2022-02-18 LAB — GLUCOSE, CAPILLARY
Glucose-Capillary: 101 mg/dL — ABNORMAL HIGH (ref 70–99)
Glucose-Capillary: 262 mg/dL — ABNORMAL HIGH (ref 70–99)
Glucose-Capillary: 122 mg/dL — ABNORMAL HIGH (ref 70–99)
Glucose-Capillary: 89 mg/dL (ref 70–99)
Glucose-Capillary: 112 mg/dL — ABNORMAL HIGH (ref 70–99)
Glucose-Capillary: 100 mg/dL — ABNORMAL HIGH (ref 70–99)

## 2022-02-18 LAB — CBC
HCT: 28.7 % — ABNORMAL LOW (ref 36.0–46.0)
Hemoglobin: 9.2 g/dL — ABNORMAL LOW (ref 12.0–15.0)
MCH: 31.9 pg (ref 26.0–34.0)
MCHC: 32.1 g/dL (ref 30.0–36.0)
MCV: 99.7 fL (ref 80.0–100.0)
Platelets: 90 10*3/uL — ABNORMAL LOW (ref 150–400)
RBC: 2.88 MIL/uL — ABNORMAL LOW (ref 3.87–5.11)
RDW: 13.6 % (ref 11.5–15.5)
WBC: 13.8 10*3/uL — ABNORMAL HIGH (ref 4.0–10.5)
nRBC: 0 % (ref 0.0–0.2)

## 2022-02-18 MED ORDER — ENOXAPARIN SODIUM 30 MG/0.3ML IJ SOSY
30.0000 mg | PREFILLED_SYRINGE | INTRAMUSCULAR | Status: DC
  Administered 2022-02-18 – 2022-02-22 (×5): 30 mg via SUBCUTANEOUS
  Filled 2022-02-18 (×5): qty 0.3

## 2022-02-18 MED ORDER — METOPROLOL TARTRATE 25 MG/10 ML ORAL SUSPENSION
12.5000 mg | Freq: Two times a day (BID) | ORAL | Status: DC
  Filled 2022-02-18 (×11): qty 5

## 2022-02-18 MED ORDER — METOPROLOL TARTRATE 12.5 MG HALF TABLET
12.5000 mg | ORAL_TABLET | Freq: Two times a day (BID) | ORAL | Status: DC
  Administered 2022-02-19 – 2022-02-25 (×13): 12.5 mg via ORAL
  Filled 2022-02-18 (×13): qty 1

## 2022-02-18 MED ORDER — KETOROLAC TROMETHAMINE 15 MG/ML IJ SOLN: 15.0000 mg | Freq: Three times a day (TID) | INTRAMUSCULAR | Status: AC | PRN

## 2022-02-18 NOTE — Progress Notes (Addendum)
   NAME:  Melissa Riley, MRN:  [id number], DOB:  02/13/1965, LOS: 2 ADMISSION DATE:  02/16/2022, CONSULTATION DATE:  1/24 REFERRING MD:  Dr. Tenny Craw, CHIEF COMPLAINT:  MVR   History of Present Illness:  Patient is a 57 year old female with pertinent PMH severe mitral regurgitation, HTN, hypothyroidism presents to [facility] on 1/24 for MVR.  Patient seen on 12/2021 by cardiology.  Most recent echo showing severe MVR.  Most recent heart cath without any significant CAD.  Referred to CT surgery for consideration of mitral valve replacement.  On 1/24 MVR performed.  Postop intubated and transferred to [facility] ICU.  CT in place.  On minimal epi and levo.  PCCM consulted.  Pertinent  Medical History   Past Medical History:  Diagnosis Date   Arthritis    Heart murmur    Hypertension    Hypothyroidism    Mitral regurgitation    Uterine fibroid    Significant Hospital Events: Including procedures, antibiotic start and stop dates in addition to other pertinent events   1/24 s/p MVR; post op intubated; pccm consulted  Interim History / Subjective:  Mild confusion after receiving 2 doses of narcotics overnight.   Objective   Blood pressure 135/72, pulse 63, temperature 98.2 F (36.8 C), temperature source Oral, resp. rate 15, height '5\' 3"'$  (1.6 m), weight 109.1 kg, SpO2 97 %. PAP: (42)/(16) 42/16      Intake/Output Summary (Last 24 hours) at 02/18/2022 0824 Last data filed at 02/18/2022 0600 Gross per 24 hour  Intake 1301.69 ml  Output 645 ml  Net 656.69 ml    Filed Weights   02/16/22 0544 02/17/22 0600 02/18/22 0600  Weight: 103.4 kg 108.8 kg 109.1 kg    Examination: General:  middle aged sitting in pain. Appears comfortable. HEENT:  [place]/AT, eyes anicteric Neuro: awake and alert, moving all extremities CV: S1S2, RRR. Bulb drain with minimal bloody output.  PULM: breathing comfortably on [place], clear chest.  GI: soft, NT, ND Extremities: mild peripheral edema, no cyanosis Skin: warm, dry,  no rashes. Sternal incision covered.   Ancillary tests personally reviewed  Na 134 HB 9.2 PLT 90  Assessment & Plan:   Post- op vent management- expected post-op - now extubated  Severe MR, s/p MVR  HTN Iron deficiency anemia Hypothyroidism Uterine fibroid; had abnormal imaging by PCP  - continue current pain regimen, pain control is adeqate - continue to wean O2 , continue I/S and mobilization - consider diuretic if still requiring oxygen this afternoon.   Best Practice (right click and "Reselect all SmartList Selections" daily)   Diet/type: regular  DVT prophylaxis: Lovenox , stop if platelets drop below 80 GI prophylaxis: PPI Lines: remove Foley:  remove Code Status:  full code Last date of multidisciplinary goals of care discussion [per primary]  Kipp Brood, MD [facility] ICU Physician [place]  Pager: [phone] Or Epic Secure Chat After hours: [phone].  02/18/2022, 8:40 AM

## 2022-02-18 NOTE — Progress Notes (Signed)
[facility] DAILY ICU PROGRESS NOTE                   [address]            [place] [address]          [phone]   2 Days Post-Op Procedure(s) (LRB): MITRAL VALVE REPAIR (MVR) USING 34 MM EDWARDS PHYSIO II ANNULOPLASTY RING (N/A) TRANSESOPHAGEAL ECHOCARDIOGRAM (TEE) (N/A)  Total Length of Stay:  LOS: 2 days   Subjective: Up in the bedside chair. Still having some chest discomfort with Oxycodone and Tramadol.   Walked in the hall yesterday.  Objective: Vital signs in last 24 hours: Temp:  [98.2 F (36.8 C)-99.3 F (37.4 C)] 98.2 F (36.8 C) (01/26 0753) Pulse Rate:  [62-71] 63 (01/26 0600) Cardiac Rhythm: Junctional rhythm (01/25 1930) Resp:  [14-33] 15 (01/26 0600) BP: (103-145)/(55-72) 135/72 (01/26 0600) SpO2:  [94 %-97 %] 97 % (01/26 0600) Arterial Line BP: (137-181)/(56-68) 139/56 (01/25 1800) Weight:  [109.1 kg] 109.1 kg (01/26 0600)  Filed Weights   02/16/22 0544 02/17/22 0600 02/18/22 0600  Weight: 103.4 kg 108.8 kg 109.1 kg    Weight change: 0.3 kg   Hemodynamic parameters for last 24 hours: PAP: (42)/(16) 42/16  Intake/Output from previous day: 01/25 0701 - 01/26 0700 In: 1349.2 [P.O.:480; I.V.:448.1; IV Piggyback:421.1] Out: 705 [Urine:685; Drains:20]  Intake/Output this shift: No intake/output data recorded.  Current Meds: Scheduled Meds:  acetaminophen  1,000 mg Oral Q6H   Or   acetaminophen (TYLENOL) oral liquid 160 mg/5 mL  1,000 mg Per Tube Q6H   ascorbic acid  250 mg Oral BID   aspirin EC  325 mg Oral Daily   Or   aspirin  324 mg Per Tube Daily   bisacodyl  10 mg Oral Daily   Or   bisacodyl  10 mg Rectal Daily   Chlorhexidine Gluconate Cloth  6 each Topical Daily   Chlorhexidine Gluconate Cloth  6 each Topical Daily   docusate sodium  200 mg Oral Daily   enoxaparin (LOVENOX) injection  40 mg Subcutaneous QHS   ferrous sulfate  325 mg Oral BID WC   insulin aspart  0-24 Units Subcutaneous Q4H   levothyroxine  25 mcg  Oral QAC breakfast   metoprolol tartrate  12.5 mg Oral BID   Or   metoprolol tartrate  12.5 mg Per Tube BID   mupirocin ointment  1 Application Nasal BID   mouth rinse  15 mL Mouth Rinse Q4H   pantoprazole  40 mg Oral Daily   sodium chloride flush  10-40 mL Intracatheter Q12H   sodium chloride flush  3 mL Intravenous Q12H   Continuous Infusions:  sodium chloride Stopped (02/17/22 0931)   sodium chloride     sodium chloride     epinephrine Stopped (02/17/22 1203)   insulin Stopped (02/17/22 1314)   lactated ringers     lactated ringers     lactated ringers Stopped (02/18/22 0536)   PRN Meds:.sodium chloride, dextrose, lactated ringers, metoprolol tartrate, morphine injection, ondansetron (ZOFRAN) IV, mouth rinse, oxyCODONE, sodium chloride flush, sodium chloride flush, traMADol  General appearance: alert, cooperative, and mild distress Neurologic: intact Heart: JR 60's - 70's.  Lungs: breath sounds shallow, normal WOB, sat 94% on RA. Abdomen: soft, NT. Extremities: Trace peripheral edema Wound: the sternotomy is covered with a dry Aquacel dressing.  Lab Results: CBC: Recent Labs    02/17/22 1612 02/18/22 0433  WBC 13.7* 13.8*  HGB  9.8* 9.2*  HCT 28.7* 28.7*  PLT 93* 90*   BMET:  Recent Labs    02/17/22 1612 02/18/22 0433  NA 135 134*  K 3.7 4.0  CL 107 105  CO2 22 23  GLUCOSE 134* 114*  BUN 13 10  CREATININE 0.77 0.57  CALCIUM 8.0* 8.1*    CMET: Lab Results  Component Value Date   WBC 13.8 (H) 02/18/2022   HGB 9.2 (L) 02/18/2022   HCT 28.7 (L) 02/18/2022   PLT 90 (L) 02/18/2022   GLUCOSE 114 (H) 02/18/2022   ALT 16 02/14/2022   AST 24 02/14/2022   NA 134 (L) 02/18/2022   K 4.0 02/18/2022   CL 105 02/18/2022   CREATININE 0.57 02/18/2022   BUN 10 02/18/2022   CO2 23 02/18/2022   INR 1.6 (H) 02/16/2022   HGBA1C 5.2 02/14/2022      PT/INR:  Recent Labs    02/16/22 1448  LABPROT 18.4*  INR 1.6*   Radiology: No results  found.   Assessment/Plan: S/P Procedure(s) (LRB): MITRAL VALVE REPAIR (MVR) USING 34 MM EDWARDS PHYSIO II ANNULOPLASTY RING (N/A) TRANSESOPHAGEAL ECHOCARDIOGRAM (TEE) (N/A)  -POD2 MV repair for severe MR. Normal biventricular function. Stable VS. Progressing slowly with mobility. Add toradol for 48h and try to limit narcotic analgesics   -Junctional rhythm- holding BB. Monitor.   -PULM- Oxygenating well on 2L [place] working on [employer].   -Volume Excess- Wt ~6kg above pre-op. Diurese.  -GI- tolerating liquids, advance as tolerated.  -Renal- normal function at baseline, creat 0.57.   -DVT PPX- daily enoxaparin, ambulate.    Antony Odea, PA-C 02/18/2022 8:10 AM

## 2022-02-18 NOTE — Progress Notes (Signed)
EVENING ROUNDS NOTE :     [address]       [place] [address]             [phone]                 2 Days Post-Op Procedure(s) (LRB): MITRAL VALVE REPAIR (MVR) USING 34 MM EDWARDS PHYSIO II ANNULOPLASTY RING (N/A) TRANSESOPHAGEAL ECHOCARDIOGRAM (TEE) (N/A)   Total Length of Stay:  LOS: 2 days  Events:   No events    BP 136/73   Pulse 72   Temp 97.9 F (36.6 C) (Oral)   Resp (!) 23   Ht '5\' 3"'$  (1.6 m)   Wt 109.1 kg   LMP  (LMP Unknown) Comment: Had BTL 28 years ago  SpO2 97%   BMI 42.61 kg/m          sodium chloride Stopped (02/17/22 0931)   sodium chloride     sodium chloride     lactated ringers     lactated ringers     lactated ringers 20 mL/hr at 02/18/22 0800    I/O last 3 completed shifts: In: 2519.2 [P.O.:480; I.V.:1142.6; IV Piggyback:896.6] Out: 1525 [Urine:1235; Drains:40; Chest Tube:250]      Latest Ref Rng & Units 02/18/2022    4:33 AM 02/17/2022    4:12 PM 02/17/2022    4:39 AM  CBC  WBC 4.0 - 10.5 K/uL 13.8  13.7  13.7   Hemoglobin 12.0 - 15.0 g/dL 9.2  9.8  10.0   Hematocrit 36.0 - 46.0 % 28.7  28.7  29.5   Platelets 150 - 400 K/uL 90  93  113        Latest Ref Rng & Units 02/18/2022    4:33 AM 02/17/2022    4:12 PM 02/17/2022    4:39 AM  BMP  Glucose 70 - 99 mg/dL 114  134  126   BUN 6 - 20 mg/dL '10  13  13   '$ Creatinine 0.44 - 1.00 mg/dL 0.57  0.77  0.71   Sodium 135 - 145 mmol/L 134  135  139   Potassium 3.5 - 5.1 mmol/L 4.0  3.7  4.1   Chloride 98 - 111 mmol/L 105  107  108   CO2 22 - 32 mmol/L '23  22  21   '$ Calcium 8.9 - 10.3 mg/dL 8.1  8.0  7.7     ABG    Component Value Date/Time   PHART 7.398 02/16/2022 2016   PCO2ART 28.6 (L) 02/16/2022 2016   PO2ART 98 02/16/2022 2016   HCO3 17.6 (L) 02/16/2022 2016   TCO2 19 (L) 02/16/2022 2016   ACIDBASEDEF 6.0 (H) 02/16/2022 2016   O2SAT 98 02/16/2022 2016       Melodie Bouillon, MD 02/18/2022 6:10 PM

## 2022-02-18 NOTE — Progress Notes (Signed)
Epicardial pacing wires removed at 1100 per MD order. Wires intact upon removal. Patient maintained on bedrest post removal for 1 hour. BP cycled q15 x4, stable throughout. Patient educated prior to removal, no new complaints.

## 2022-02-18 NOTE — Progress Notes (Signed)
T[facility] Progress Note: 2 Days Post-Op Procedure(s) (LRB): MITRAL VALVE REPAIR (MVR) USING 34 MM EDWARDS PHYSIO II ANNULOPLASTY RING (N/A) TRANSESOPHAGEAL ECHOCARDIOGRAM (TEE) (N/A)  LOS: 2 days   Doing well.   Says she feels loopy.   Sitting up talkative on [place] Accelerated junctional 80 WWP     Latest Ref Rng & Units 02/18/2022    4:33 AM 02/17/2022    4:12 PM 02/17/2022    4:39 AM  CBC  WBC 4.0 - 10.5 K/uL 13.8  13.7  13.7   Hemoglobin 12.0 - 15.0 g/dL 9.2  9.8  10.0   Hematocrit 36.0 - 46.0 % 28.7  28.7  29.5   Platelets 150 - 400 K/uL 90  93  113        Latest Ref Rng & Units 02/18/2022    4:33 AM 02/17/2022    4:12 PM 02/17/2022    4:39 AM  CMP  Glucose 70 - 99 mg/dL 114  134  126   BUN 6 - 20 mg/dL '10  13  13   '$ Creatinine 0.44 - 1.00 mg/dL 0.57  0.77  0.71   Sodium 135 - 145 mmol/L 134  135  139   Potassium 3.5 - 5.1 mmol/L 4.0  3.7  4.1   Chloride 98 - 111 mmol/L 105  107  108   CO2 22 - 32 mmol/L '23  22  21   '$ Calcium 8.9 - 10.3 mg/dL 8.1  8.0  7.7     ABG    Component Value Date/Time   PHART 7.398 02/16/2022 2016   PCO2ART 28.6 (L) 02/16/2022 2016   PO2ART 98 02/16/2022 2016   HCO3 17.6 (L) 02/16/2022 2016   TCO2 19 (L) 02/16/2022 2016   ACIDBASEDEF 6.0 (H) 02/16/2022 2016   O2SAT 98 02/16/2022 2016      Plan POD 2 MV rpr  Doing very well In accelerated junctional.   DC wires Keep bulb in place - plan do DC this tomorrow.  Dc oxy  Tramadol for pain. Toradol prn as well Floor tomorrow if on 4L [place] or less.  Red rubber bumpers will stay on until I see patient back in clinic on Feb 15th.

## 2022-02-19 LAB — CBC
HCT: 28.9 % — ABNORMAL LOW (ref 36.0–46.0)
Hemoglobin: 9.7 g/dL — ABNORMAL LOW (ref 12.0–15.0)
MCH: 32.4 pg (ref 26.0–34.0)
MCHC: 33.6 g/dL (ref 30.0–36.0)
MCV: 96.7 fL (ref 80.0–100.0)
Platelets: 103 10*3/uL — ABNORMAL LOW (ref 150–400)
RBC: 2.99 MIL/uL — ABNORMAL LOW (ref 3.87–5.11)
RDW: 13.6 % (ref 11.5–15.5)
WBC: 13 10*3/uL — ABNORMAL HIGH (ref 4.0–10.5)
nRBC: 0 % (ref 0.0–0.2)

## 2022-02-19 LAB — BASIC METABOLIC PANEL
Anion gap: 7 (ref 5–15)
BUN: 8 mg/dL (ref 6–20)
CO2: 22 mmol/L (ref 22–32)
Calcium: 8.5 mg/dL — ABNORMAL LOW (ref 8.9–10.3)
Chloride: 105 mmol/L (ref 98–111)
Creatinine, Ser: 0.64 mg/dL (ref 0.44–1.00)
GFR, Estimated: >60 mL/min (ref >60)
Glucose, Bld: 106 mg/dL — ABNORMAL HIGH (ref 70–99)
Potassium: 3.9 mmol/L (ref 3.5–5.1)
Sodium: 134 mmol/L — ABNORMAL LOW (ref 135–145)

## 2022-02-19 LAB — GLUCOSE, CAPILLARY
Glucose-Capillary: 100 mg/dL — ABNORMAL HIGH (ref 70–99)
Glucose-Capillary: 100 mg/dL — ABNORMAL HIGH (ref 70–99)
Glucose-Capillary: 107 mg/dL — ABNORMAL HIGH (ref 70–99)
Glucose-Capillary: 127 mg/dL — ABNORMAL HIGH (ref 70–99)
Glucose-Capillary: 129 mg/dL — ABNORMAL HIGH (ref 70–99)
Glucose-Capillary: 85 mg/dL (ref 70–99)
Glucose-Capillary: 95 mg/dL (ref 70–99)

## 2022-02-19 MED ORDER — SODIUM CHLORIDE 0.9 % IV SOLN: 250.0000 mL | INTRAVENOUS | Status: DC | PRN

## 2022-02-19 MED ORDER — POTASSIUM CHLORIDE CRYS ER 20 MEQ PO TBCR
20.0000 meq | EXTENDED_RELEASE_TABLET | Freq: Once | ORAL | Status: AC
  Administered 2022-02-19: 20 meq via ORAL
  Filled 2022-02-19: qty 1

## 2022-02-19 MED ORDER — ~~LOC~~ CARDIAC SURGERY, PATIENT & FAMILY EDUCATION: Freq: Once | Status: AC

## 2022-02-19 MED ORDER — SODIUM CHLORIDE 0.9% FLUSH
3.0000 mL | Freq: Two times a day (BID) | INTRAVENOUS | Status: DC
  Administered 2022-02-19 – 2022-02-25 (×8): 3 mL via INTRAVENOUS

## 2022-02-19 MED ORDER — INSULIN ASPART 100 UNIT/ML IJ SOLN
0.0000 [IU] | Freq: Three times a day (TID) | INTRAMUSCULAR | Status: DC
  Administered 2022-02-20 (×2): 2 [IU] via SUBCUTANEOUS

## 2022-02-19 MED ORDER — SODIUM CHLORIDE 0.9% FLUSH
3.0000 mL | INTRAVENOUS | Status: DC | PRN
  Administered 2022-02-20: 3 mL via INTRAVENOUS

## 2022-02-19 NOTE — Progress Notes (Signed)
[address]       [place] [address]             [phone]                 3 Days Post-Op Procedure(s) (LRB): MITRAL VALVE REPAIR (MVR) USING 34 MM EDWARDS PHYSIO II ANNULOPLASTY RING (N/A) TRANSESOPHAGEAL ECHOCARDIOGRAM (TEE) (N/A)   Events: No events _______________________________________________________________ Vitals: BP 113/70 (BP Location: Left Arm)   Pulse 69   Temp 98.2 F (36.8 C) (Oral)   Resp 19   Ht '5\' 3"'$  (1.6 m)   Wt 108.4 kg   LMP  (LMP Unknown) Comment: Had BTL 28 years ago  SpO2 97%   BMI 42.33 kg/m  Filed Weights   02/17/22 0600 02/18/22 0600 02/19/22 0500  Weight: 108.8 kg 109.1 kg 108.4 kg     - Neuro: alert NAD  - Cardiovascular: sinus  Drips: none.      - Pulm: EWOB    ABG    Component Value Date/Time   PHART 7.398 02/16/2022 2016   PCO2ART 28.6 (L) 02/16/2022 2016   PO2ART 98 02/16/2022 2016   HCO3 17.6 (L) 02/16/2022 2016   TCO2 19 (L) 02/16/2022 2016   ACIDBASEDEF 6.0 (H) 02/16/2022 2016   O2SAT 98 02/16/2022 2016    - Abd: ND - Extremity: warm  .Intake/Output      01/26 0701 01/27 0700 01/27 0701 01/28 0700   P.O.     I.V. (mL/kg) 51.7 (0.5)    Other 0    IV Piggyback 21.8    Total Intake(mL/kg) 73.4 (0.7)    Urine (mL/kg/hr) 525 (0.2)    Drains 0    Total Output 525    Net -451.6            _______________________________________________________________ Labs:    Latest Ref Rng & Units 02/19/2022    3:10 AM 02/18/2022    4:33 AM 02/17/2022    4:12 PM  CBC  WBC 4.0 - 10.5 K/uL 13.0  13.8  13.7   Hemoglobin 12.0 - 15.0 g/dL 9.7  9.2  9.8   Hematocrit 36.0 - 46.0 % 28.9  28.7  28.7   Platelets 150 - 400 K/uL 103  90  93       Latest Ref Rng & Units 02/19/2022    3:10 AM 02/18/2022    4:33 AM 02/17/2022    4:12 PM  CMP  Glucose 70 - 99 mg/dL 106  114  134   BUN 6 - 20 mg/dL '8  10  13   '$ Creatinine 0.44 - 1.00 mg/dL 0.64  0.57  0.77   Sodium 135 - 145 mmol/L 134  134  135    Potassium 3.5 - 5.1 mmol/L 3.9  4.0  3.7   Chloride 98 - 111 mmol/L 105  105  107   CO2 22 - 32 mmol/L '22  23  22   '$ Calcium 8.9 - 10.3 mg/dL 8.5  8.1  8.0     CXR: stsable  _______________________________________________________________  Assessment and Plan: POD 3 s/p MVR  Neuro: pain controlled CV: sinus.   Pulm: IS, ambulation Renal: creat stable, diuresis GI: on diet Heme: stable ID: [email or web address] Endo: SSI Dispo: floor   Melissa Riley 02/19/2022 8:43 AM

## 2022-02-19 NOTE — Progress Notes (Signed)
Pt went into Afib RVR 160's post walk at 0640. Gave PRN lopressor 2.'5mg'$  followed by additional 2.5 mg. Pt converted back to junctional rhythm within 15 minutes. Bp WNL, SATs 100% on 2L [place].

## 2022-02-19 NOTE — Progress Notes (Signed)
Patient's family requesting rehab at discharge due to the fact patient is home alone and only has her elderly mother nearby.  Also, patient requesting anti-itch cream for incision.  Per Dr. Kipp Brood and Dr. Lynetta Mare, anti-itch cream should not be used around incision and patient will have a PT consult prior to discharge to determine eligibility for rehab. Patient's sister requesting MD update.  Dr. Kipp Brood and Dr. Lynetta Mare aware.

## 2022-02-20 LAB — CBC
HCT: 28.3 % — ABNORMAL LOW (ref 36.0–46.0)
Hemoglobin: 9.4 g/dL — ABNORMAL LOW (ref 12.0–15.0)
MCH: 32.1 pg (ref 26.0–34.0)
MCHC: 33.2 g/dL (ref 30.0–36.0)
MCV: 96.6 fL (ref 80.0–100.0)
Platelets: 161 10*3/uL (ref 150–400)
RBC: 2.93 MIL/uL — ABNORMAL LOW (ref 3.87–5.11)
RDW: 13.9 % (ref 11.5–15.5)
WBC: 11 10*3/uL — ABNORMAL HIGH (ref 4.0–10.5)
nRBC: 0.2 % (ref 0.0–0.2)

## 2022-02-20 LAB — BASIC METABOLIC PANEL
Anion gap: 10 (ref 5–15)
BUN: 9 mg/dL (ref 6–20)
CO2: 19 mmol/L — ABNORMAL LOW (ref 22–32)
Calcium: 8.5 mg/dL — ABNORMAL LOW (ref 8.9–10.3)
Chloride: 108 mmol/L (ref 98–111)
Creatinine, Ser: 0.7 mg/dL (ref 0.44–1.00)
GFR, Estimated: >60 mL/min (ref >60)
Glucose, Bld: 104 mg/dL — ABNORMAL HIGH (ref 70–99)
Potassium: 3.3 mmol/L — ABNORMAL LOW (ref 3.5–5.1)
Sodium: 137 mmol/L (ref 135–145)

## 2022-02-20 LAB — GLUCOSE, CAPILLARY
Glucose-Capillary: 117 mg/dL — ABNORMAL HIGH (ref 70–99)
Glucose-Capillary: 127 mg/dL — ABNORMAL HIGH (ref 70–99)
Glucose-Capillary: 105 mg/dL — ABNORMAL HIGH (ref 70–99)
Glucose-Capillary: 138 mg/dL — ABNORMAL HIGH (ref 70–99)
Glucose-Capillary: 88 mg/dL (ref 70–99)

## 2022-02-20 MED ORDER — POTASSIUM CHLORIDE CRYS ER 20 MEQ PO TBCR
20.0000 meq | EXTENDED_RELEASE_TABLET | ORAL | Status: AC
  Administered 2022-02-20 (×3): 20 meq via ORAL
  Filled 2022-02-20 (×3): qty 1

## 2022-02-20 NOTE — Progress Notes (Signed)
   02/20/22 1354  Vitals  Temp 97.6 F (36.4 C)  Temp Source Oral  BP (!) 147/79  MAP (mmHg) 98  BP Location Left Arm  BP Method Automatic  Patient Position (if appropriate) Sitting  Pulse Rate 65  ECG Heart Rate 65  Resp 19  MEWS COLOR  MEWS Score Color Green  Oxygen Therapy  SpO2 97 %  O2 Device Room Air  MEWS Score  MEWS Temp 0  MEWS Systolic 0  MEWS Pulse 0  MEWS RR 0  MEWS LOC 0  MEWS Score 0   Patient arrived from [facility] to 4e16, vital signs obtained and patient placed on monitor and CCMD made aware. Patient assisted to chair and call bell within reach. Chailyn Racette, Bettina Gavia RN

## 2022-02-20 NOTE — Progress Notes (Signed)
Mobility Specialist Progress Note    02/20/22 1620  Mobility  Activity Ambulated with assistance to bathroom;Ambulated with assistance in hallway  Level of Assistance Standby assist, set-up cues, supervision of patient - no hands on  Assistive Device Front wheel walker  Distance Ambulated (ft) 200 ft (10+190)  Activity Response Tolerated well  Mobility Referral Yes  $Mobility charge 1 Mobility   Pre-Mobility: 76 HR During Mobility: 88 HR Post-Mobility: 73 HR  Pt received requesting to attempt BM. No complaints. Agreeable to hallway ambulation. Took x1 standing rest break. Pt SOB w/ exertion. Returned to chair with NT present.   Hildred Alamin Mobility Specialist  Please Psychologist, sport and exercise or Rehab Office at [phone]

## 2022-02-20 NOTE — Progress Notes (Signed)
[address]       [place] [address]             [phone]                 4 Days Post-Op Procedure(s) (LRB): MITRAL VALVE REPAIR (MVR) USING 34 MM EDWARDS PHYSIO II ANNULOPLASTY RING (N/A) TRANSESOPHAGEAL ECHOCARDIOGRAM (TEE) (N/A)   Events: No events _______________________________________________________________ Vitals: BP 123/74   Pulse 62   Temp 98.1 F (36.7 C) (Oral)   Resp 19   Ht '5\' 3"'$  (1.6 m)   Wt 108.4 kg   LMP  (LMP Unknown) Comment: Had BTL 28 years ago  SpO2 98%   BMI 42.33 kg/m  Filed Weights   02/17/22 0600 02/18/22 0600 02/19/22 0500  Weight: 108.8 kg 109.1 kg 108.4 kg     - Neuro: alert NAD  - Cardiovascular: sinus  Drips: none.      - Pulm: EWOB    ABG    Component Value Date/Time   PHART 7.398 02/16/2022 2016   PCO2ART 28.6 (L) 02/16/2022 2016   PO2ART 98 02/16/2022 2016   HCO3 17.6 (L) 02/16/2022 2016   TCO2 19 (L) 02/16/2022 2016   ACIDBASEDEF 6.0 (H) 02/16/2022 2016   O2SAT 98 02/16/2022 2016    - Abd: ND - Extremity: warm  .Intake/Output      01/27 0701 01/28 0700 01/28 0701 01/29 0700   P.Melissa. 600 120   I.V. (mL/kg)     Other 0    IV Piggyback     Total Intake(mL/kg) 600 (5.5) 120 (1.1)   Urine (mL/kg/hr) 350 (0.1) 650 (1.4)   Drains 50 10   Stool 1    Total Output 401 660   Net +199 -540        Urine Occurrence 7 x    Stool Occurrence 6 x       _______________________________________________________________ Labs:    Latest Ref Rng & Units 02/20/2022    3:53 AM 02/19/2022    3:10 AM 02/18/2022    4:33 AM  CBC  WBC 4.0 - 10.5 K/uL 11.0  13.0  13.8   Hemoglobin 12.0 - 15.0 g/dL 9.4  9.7  9.2   Hematocrit 36.0 - 46.0 % 28.3  28.9  28.7   Platelets 150 - 400 K/uL 161  103  90       Latest Ref Rng & Units 02/20/2022    3:53 AM 02/19/2022    3:10 AM 02/18/2022    4:33 AM  CMP  Glucose 70 - 99 mg/dL 104  106  114   BUN 6 - 20 mg/dL '9  8  10   '$ Creatinine 0.44 - 1.00 mg/dL 0.70  0.64   0.57   Sodium 135 - 145 mmol/L 137  134  134   Potassium 3.5 - 5.1 mmol/L 3.3  3.9  4.0   Chloride 98 - 111 mmol/L 108  105  105   CO2 22 - 32 mmol/L '19  22  23   '$ Calcium 8.9 - 10.3 mg/dL 8.5  8.5  8.1     CXR: stsable  _______________________________________________________________  Assessment and Plan: POD 4 s/p MVR  Neuro: pain controlled CV: sinus.   Pulm: IS, ambulation Renal: creat stable, diuresis GI: on diet Heme: stable ID: [email or web address] Endo: SSI Dispo: floor   Melissa Riley Melissa Riley 02/20/2022 11:14 AM

## 2022-02-21 ENCOUNTER — Inpatient Hospital Stay (HOSPITAL_COMMUNITY): Payer: Commercial Managed Care - HMO

## 2022-02-21 LAB — BASIC METABOLIC PANEL
Anion gap: 11 (ref 5–15)
BUN: 7 mg/dL (ref 6–20)
CO2: 21 mmol/L — ABNORMAL LOW (ref 22–32)
Calcium: 8.7 mg/dL — ABNORMAL LOW (ref 8.9–10.3)
Chloride: 106 mmol/L (ref 98–111)
Creatinine, Ser: 0.68 mg/dL (ref 0.44–1.00)
GFR, Estimated: >60 mL/min (ref >60)
Glucose, Bld: 103 mg/dL — ABNORMAL HIGH (ref 70–99)
Potassium: 3.5 mmol/L (ref 3.5–5.1)
Sodium: 138 mmol/L (ref 135–145)

## 2022-02-21 LAB — GLUCOSE, CAPILLARY
Glucose-Capillary: 105 mg/dL — ABNORMAL HIGH (ref 70–99)
Glucose-Capillary: 94 mg/dL (ref 70–99)
Glucose-Capillary: 88 mg/dL (ref 70–99)
Glucose-Capillary: 102 mg/dL — ABNORMAL HIGH (ref 70–99)

## 2022-02-21 MED ORDER — POTASSIUM CHLORIDE CRYS ER 20 MEQ PO TBCR
20.0000 meq | EXTENDED_RELEASE_TABLET | Freq: Two times a day (BID) | ORAL | Status: DC
  Administered 2022-02-21 – 2022-02-22 (×3): 20 meq via ORAL
  Filled 2022-02-21 (×3): qty 1

## 2022-02-21 MED ORDER — HYDROCORTISONE 1 % EX OINT
TOPICAL_OINTMENT | Freq: Three times a day (TID) | CUTANEOUS | Status: DC | PRN
  Filled 2022-02-21: qty 28

## 2022-02-21 MED ORDER — FUROSEMIDE 40 MG PO TABS
40.0000 mg | ORAL_TABLET | Freq: Two times a day (BID) | ORAL | Status: DC
  Administered 2022-02-21 – 2022-02-22 (×3): 40 mg via ORAL
  Filled 2022-02-21 (×3): qty 1

## 2022-02-21 MED ORDER — AMLODIPINE BESYLATE 10 MG PO TABS
10.0000 mg | ORAL_TABLET | Freq: Every day | ORAL | Status: DC
  Administered 2022-02-21 – 2022-02-25 (×5): 10 mg via ORAL
  Filled 2022-02-21 (×5): qty 1

## 2022-02-21 MED ORDER — POTASSIUM CHLORIDE CRYS ER 20 MEQ PO TBCR
30.0000 meq | EXTENDED_RELEASE_TABLET | Freq: Once | ORAL | Status: AC
  Administered 2022-02-21: 30 meq via ORAL
  Filled 2022-02-21: qty 1

## 2022-02-21 NOTE — Evaluation (Signed)
Physical Therapy Evaluation Patient Details Name: Melissa Riley MRN: [id number] DOB: February 27, 1965 Today's Date: 02/21/2022  History of Present Illness  57 y.o. female presents to  [facility] hospital on 02/16/2022 for mitral valve repair. Pt underwent sternotomy and mitral valve repair on 1/24. PMH includes heart murmur, HTN, MR.  Clinical Impression  Pt presents to PT with deficits in endurance and cardiopulmonary function. Pt is mobilizing well, performing bed mobility and transfers without physical assistance. Pt is able to progress to ambulation without UE support, however she will benefit from receiving a 4 wheeled walker with seat to allow for improved energy conservation. PT recommends outpatient cardiac rehab at the time of discharge.       Recommendations for follow up therapy are one component of a multi-disciplinary discharge planning process, led by the attending physician.  Recommendations may be updated based on patient status, additional functional criteria and insurance authorization.  Follow Up Recommendations Other (comment) (outpatient cardiopulmonary rehab)      Assistance Recommended at Discharge PRN  Patient can return home with the following  Assistance with cooking/housework;Assist for transportation    Equipment Recommendations Rollator (4 wheels)  Recommendations for Other Services       Functional Status Assessment Patient has had a recent decline in their functional status and demonstrates the ability to make significant improvements in function in a reasonable and predictable amount of time.     Precautions / Restrictions Precautions Precautions: Sternal Precaution Booklet Issued: No Precaution Comments: verbally educated on sternal precautions Restrictions Weight Bearing Restrictions: No Other Position/Activity Restrictions: sternal precautions      Mobility  Bed Mobility Overal bed mobility: Modified Independent             General bed mobility  comments: pt in long sitting upon PT arrival, transitions from long sitting to sitting edge of bed. PT provides education on use of sidelying to sitting if transitioning from supine to long sitting is ever a challenge    Transfers Overall transfer level: Needs assistance Equipment used: Rolling walker (2 wheels) Transfers: Sit to/from Stand Sit to Stand: Supervision           General transfer comment: minimal push of UE on walker    Ambulation/Gait Ambulation/Gait assistance: Supervision Gait Distance (Feet): 250 Feet Assistive device: Rolling walker (2 wheels), None (first 125' with RW, last 125 without DME) Gait Pattern/deviations: Step-through pattern Gait velocity: reduced Gait velocity interpretation: <1.8 ft/sec, indicate of risk for recurrent falls   General Gait Details: slowed step-through gait  Stairs            Wheelchair Mobility    Modified Rankin (Stroke Patients Only)       Balance Overall balance assessment: Needs assistance Sitting-balance support: No upper extremity supported, Feet supported Sitting balance-Leahy Scale: Good     Standing balance support: No upper extremity supported, During functional activity Standing balance-Leahy Scale: Good                               Pertinent Vitals/Pain Pain Assessment Pain Assessment: No/denies pain    Home Living Family/patient expects to be discharged to:: Private residence Living Arrangements: Parent Available Help at Discharge: Family;Available 24 hours/day Type of Home: Apartment Home Access: Level entry       Home Layout: One level Home Equipment: Shower seat      Prior Function Prior Level of Function : Independent/Modified Independent;Working/employed;Driving  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       Hand Dominance        Extremity/Trunk Assessment   Upper Extremity Assessment Upper Extremity Assessment: Overall WFL for tasks  assessed (strength not formally assessed 2/2 sternal precautions however ROM is functional)    Lower Extremity Assessment Lower Extremity Assessment: Overall WFL for tasks assessed    Cervical / Trunk Assessment Cervical / Trunk Assessment: Normal  Communication   Communication: No difficulties  Cognition Arousal/Alertness: Awake/alert Behavior During Therapy: WFL for tasks assessed/performed Overall Cognitive Status: Within Functional Limits for tasks assessed                                          General Comments General comments (skin integrity, edema, etc.): VSS on RA, HR into low 100s, SpO2 in low 90s. PT notes increased RR and reports of mild DOE when mobilizing, pt appears to recover quickly when seated    Exercises     Assessment/Plan    PT Assessment Patient needs continued PT services  PT Problem List Decreased activity tolerance;Cardiopulmonary status limiting activity       PT Treatment Interventions DME instruction;Gait training;Therapeutic activities;Therapeutic exercise;Balance training;Patient/family education    PT Goals (Current goals can be found in the Care Plan section)  Acute Rehab PT Goals Patient Stated Goal: to go home tomorrow PT Goal Formulation: With patient Time For Goal Achievement: 03/07/22 Potential to Achieve Goals: Good Additional Goals Additional Goal #1: Pt will independently verbalize and maintain sternal precautions to protect sternal incision site    Frequency Min 3X/week     Co-evaluation               AM-PAC PT "6 Clicks" Mobility  Outcome Measure Help needed turning from your back to your side while in a flat bed without using bedrails?: None Help needed moving from lying on your back to sitting on the side of a flat bed without using bedrails?: None Help needed moving to and from a bed to a chair (including a wheelchair)?: A Little Help needed standing up from a chair using your arms (e.g.,  wheelchair or bedside chair)?: A Little Help needed to walk in hospital room?: A Little Help needed climbing 3-5 steps with a railing? : A Little 6 Click Score: 20    End of Session   Activity Tolerance: Patient tolerated treatment well Patient left: in bed;with call bell/phone within reach Nurse Communication: Mobility status PT Visit Diagnosis: Other abnormalities of gait and mobility (R26.89)    Time: 7654-6503 PT Time Calculation (min) (ACUTE ONLY): 17 min   Charges:   PT Evaluation $PT Eval Low Complexity: Healy, PT, DPT Acute Rehabilitation Office [phone]   Zenaida Niece 02/21/2022, 8:58 AM

## 2022-02-21 NOTE — Progress Notes (Signed)
CARDIAC REHAB PHASE I   Pt has just returned to bed from walk with PT. Pt reports tolerating well. Discussed CRP1 services with pt. Encouraged IS use and continued ambulation. Will return to offer walk later today as time allows.   [id number]  Vanessa Barbara, RN BSN 02/21/2022 8:53 AM

## 2022-02-21 NOTE — Progress Notes (Addendum)
[address]       [place] [address]             [phone]      5 Days Post-Op Procedure(s) (LRB): MITRAL VALVE REPAIR (MVR) USING 34 MM EDWARDS PHYSIO II ANNULOPLASTY RING (N/A) TRANSESOPHAGEAL ECHOCARDIOGRAM (TEE) (N/A) Subjective:  Transferred from ICU yesterday.  Awake and alert. Primary complaint is itching and discomfort around the sternal retention sutures.   Walked with mobility team yesterday.  BM prior to transfer out of ICU   Objective: Vital signs in last 24 hours: Temp:  [97.6 F (36.4 C)-98.9 F (37.2 C)] 98.8 F (37.1 C) (01/29 0324) Pulse Rate:  [59-72] 64 (01/29 0324) Cardiac Rhythm: Normal sinus rhythm (01/28 2030) Resp:  [15-26] 21 (01/29 0324) BP: (119-147)/(65-81) 136/68 (01/29 0324) SpO2:  [90 %-100 %] 100 % (01/29 0324) Weight:  [106.2 kg] 106.2 kg (01/29 0600)     Intake/Output from previous day: 01/28 0701 - 01/29 0700 In: 120 [P.O.:120] Out: 660 [Urine:650; Drains:10] Intake/Output this shift: No intake/output data recorded.  General appearance: alert, cooperative, and mild distress Neurologic: intact Heart: regular rhythm, few PVC's.  Appears to have P-waves most of the time but at low amplitude.  Lungs: clear to auscultation bilaterally Abdomen: soft, NT.  Extremities: warm and well perfused Wound: the sternotomy incision is well approximated and dry. The retention sutures are pressing into the skin in several places. No erythema or drainage currently but these appear they will eventually crate erosions into the skin.   Lab Results: Recent Labs    02/19/22 0310 02/20/22 0353  WBC 13.0* 11.0*  HGB 9.7* 9.4*  HCT 28.9* 28.3*  PLT 103* 161   BMET:  Recent Labs    02/19/22 0310 02/20/22 0353  NA 134* 137  K 3.9 3.3*  CL 105 108  CO2 22 19*  GLUCOSE 106* 104*  BUN 8 9  CREATININE 0.64 0.70  CALCIUM 8.5* 8.5*    PT/INR: No results for input(s): "LABPROT", "INR" in the last 72 hours. ABG     Component Value Date/Time   PHART 7.398 02/16/2022 2016   HCO3 17.6 (L) 02/16/2022 2016   TCO2 19 (L) 02/16/2022 2016   ACIDBASEDEF 6.0 (H) 02/16/2022 2016   O2SAT 98 02/16/2022 2016   CBG (last 3)  Recent Labs    02/20/22 1630 02/20/22 2142 02/21/22 0631  GLUCAP 138* 88 105*    Assessment/Plan: S/P Procedure(s) (LRB): MITRAL VALVE REPAIR (MVR) USING 34 MM EDWARDS PHYSIO II ANNULOPLASTY RING (N/A) TRANSESOPHAGEAL ECHOCARDIOGRAM (TEE) (N/A)  -POD5 MV repair for severe MR. BP stable and appears to have recovery or sinus node function. Will check EKG. She is currently on low-dose metoprolol.  -Hypertension- on amlodipine and lisinopril prior to admission. SBP 150's this morning. Will re-start the amlodipine for now and monitor.   -Volume excess-Wt still about 3kg +.  Continue diuresis with oral Lasix  -Expected ABL anemia- Stable, on Fe++ supp. Monitor.   -GI- tolerating diet, appropriate bowel function.  -RENAL- normal function at baseline. K+ 3.3 yesterday and supplement given. Recheck BMP today.   -Skin irritation at sternal retention sutures- Will attempt to trim the length of the rubber bolsters to relieve pressure against the skin.   -Disposition- PT / OT evaluations requested.     LOS: 5 days    Antony Odea, [place] [phone] 02/21/2022  Agree with above Doing well Continue PT Dispo planning.  Bandon Sherwin Bary Leriche

## 2022-02-21 NOTE — Evaluation (Signed)
Occupational Therapy Evaluation Patient Details Name: Melissa Riley MRN: [id number] DOB: [birth year] Today's Date: 02/21/2022   History of Present Illness 57 y.o. female presents to [facility] hospital on 02/16/2022 for mitral valve repair. Pt underwent sternotomy and mitral valve repair on 1/24. PMH includes heart murmur, HTN, MR.   Clinical Impression   PTA pt independent with ADL and mobility and works as a Print production planner. Increased SOB with activity preop and states she continues to have SOB, however it is "much better". Requires min A with ADL due to sternotomy precautions., body habitus and knee ROM restrictions. Began education on sternotomy precautions, pursed lip breathing with energy conservation, compensatory strategies and use of AE to assist with LB ADL. Will follow up tomorrow to facilitate a safe DC home with assistance of family. VSS on RA     Recommendations for follow up therapy are one component of a multi-disciplinary discharge planning process, led by the attending physician.  Recommendations may be updated based on patient status, additional functional criteria and insurance authorization.   Follow Up Recommendations  No OT follow up     Assistance Recommended at Discharge Intermittent Supervision/Assistance  Patient can return home with the following A little help with bathing/dressing/bathroom;Assistance with cooking/housework;Assist for transportation    Functional Status Assessment  Patient has had a recent decline in their functional status and demonstrates the ability to make significant improvements in function in a reasonable and predictable amount of time.  Equipment Recommendations  Other (comment) (rollator)    Recommendations for Other Services       Precautions / Restrictions Precautions Precautions: Sternal Precaution Booklet Issued: No Precaution Comments: verbally educated on sternal precautions Restrictions Weight Bearing Restrictions:  No Other Position/Activity Restrictions: sternal precautions      Mobility Bed Mobility Overal bed mobility: Modified Independent             General bed mobility comments: pt in long sitting; good use of pillow    Transfers Overall transfer level: Needs assistance Equipment used: Rolling walker (2 wheels) Transfers: Sit to/from Stand Sit to Stand: Supervision           General transfer comment: minimal push of UE on walker      Balance Overall balance assessment: Needs assistance Sitting-balance support: No upper extremity supported, Feet supported Sitting balance-Leahy Scale: Good     Standing balance support: No upper extremity supported, During functional activity Standing balance-Leahy Scale: Good                             ADL either performed or assessed with clinical judgement   ADL Overall ADL's : Needs assistance/impaired     Grooming: Set up;Supervision/safety   Upper Body Bathing: Set up   Lower Body Bathing: Minimal assistance   Upper Body Dressing : Minimal assistance   Lower Body Dressing: Minimal assistance   Toilet Transfer: Supervision/safety;Ambulation   Toileting- Clothing Manipulation and Hygiene: Minimal assistance       Functional mobility during ADLs: Supervision/safety General ADL Comments: began educating pt on compensatory strategies; has difficulty reaching B feet due to body habitus, sternotomy and knee ROM restrictions at baseline; would benefit form use of AE - began education on AE - pt interested in learning     Vision Baseline Vision/History: 0 No visual deficits       Perception     Praxis      Pertinent Vitals/Pain Pain Assessment Pain Assessment: Faces Faces  Pain Scale: Hurts little more Pain Location: chest when coughing Pain Descriptors / Indicators: Discomfort, Grimacing, Guarding Pain Intervention(s): Limited activity within patient's tolerance (using pillow to splint)     Hand  Dominance Right   Extremity/Trunk Assessment Upper Extremity Assessment Upper Extremity Assessment: Overall WFL for tasks assessed   Lower Extremity Assessment Lower Extremity Assessment: Defer to PT evaluation   Cervical / Trunk Assessment Cervical / Trunk Assessment: Normal;Other exceptions (increased body habitus)   Communication Communication Communication: No difficulties   Cognition Arousal/Alertness: Awake/alert Behavior During Therapy: WFL for tasks assessed/performed Overall Cognitive Status: Within Functional Limits for tasks assessed                                       General Comments  increased SOB with activity; encouraged pursed lip breathing    Exercises     Shoulder Instructions      Home Living Family/patient expects to be discharged to:: Private residence Living Arrangements: Parent Available Help at Discharge: Family;Available 24 hours/day Type of Home: Apartment Home Access: Level entry     Home Layout: One level     Bathroom Shower/Tub: Teacher, early years/pre: Standard Bathroom Accessibility: Yes How Accessible: Accessible via walker Home Equipment: Shower seat          Prior Functioning/Environment Prior Level of Function : Independent/Modified Independent;Working/employed;Driving             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          OT Problem List: Decreased activity tolerance;Decreased range of motion;Decreased knowledge of use of DME or AE;Decreased knowledge of precautions;Cardiopulmonary status limiting activity;Obesity;Pain      OT Treatment/Interventions: Self-care/ADL training;Therapeutic exercise;Energy conservation;DME and/or AE instruction;Therapeutic activities;Patient/family education    OT Goals(Current goals can be found in the care plan section) Acute Rehab OT Goals Patient Stated Goal: to be ablet o take care of herself OT Goal Formulation: With patient Time For  Goal Achievement: 03/07/22 Potential to Achieve Goals: Good  OT Frequency: Min 2X/week    Co-evaluation              AM-PAC OT "6 Clicks" Daily Activity     Outcome Measure Help from another person eating meals?: None Help from another person taking care of personal grooming?: A Little Help from another person toileting, which includes using toliet, bedpan, or urinal?: A Little Help from another person bathing (including washing, rinsing, drying)?: A Little Help from another person to put on and taking off regular upper body clothing?: A Little Help from another person to put on and taking off regular lower body clothing?: A Little 6 Click Score: 19   End of Session Nurse Communication: Mobility status  Activity Tolerance: Patient tolerated treatment well Patient left: in bed;with call bell/phone within reach  OT Visit Diagnosis: Unsteadiness on feet (R26.81)                Time: 6333-5456 OT Time Calculation (min): 23 min Charges:  OT General Charges $OT Visit: 1 Visit OT Evaluation $OT Eval Moderate Complexity: 1 Mod OT Treatments $Self Care/Home Management : 8-22 mins  Maurie Boettcher, OT/L   Acute OT Clinical Specialist Acute Rehabilitation Services Pager [phone] Office [phone]   [facility] 02/21/2022, 9:48 AM

## 2022-02-21 NOTE — Progress Notes (Signed)
Mobility Specialist Progress Note:   02/21/22 1213  Mobility  Activity Ambulated with assistance in room;Ambulated with assistance to bathroom  Level of Assistance Standby assist, set-up cues, supervision of patient - no hands on  Assistive Device None  Distance Ambulated (ft) 40 ft  Activity Response Tolerated well  $Mobility charge 1 Mobility   Pt received in bed asking to use bathroom. No Complaints. Declined hallway ambulation d/t walking not long ago and lunch arriving. Left EOB with call bell in reach and all needs met.   Gareth Eagle Ronette Hank Mobility Specialist Please contact via [employer] or  Rehab Office at [phone]

## 2022-02-22 ENCOUNTER — Other Ambulatory Visit (HOSPITAL_COMMUNITY): Payer: Self-pay

## 2022-02-22 LAB — BASIC METABOLIC PANEL
Anion gap: 13 (ref 5–15)
BUN: 8 mg/dL (ref 6–20)
CO2: 22 mmol/L (ref 22–32)
Calcium: 9.1 mg/dL (ref 8.9–10.3)
Chloride: 103 mmol/L (ref 98–111)
Creatinine, Ser: 0.72 mg/dL (ref 0.44–1.00)
GFR, Estimated: >60 mL/min (ref >60)
Glucose, Bld: 90 mg/dL (ref 70–99)
Potassium: 3.6 mmol/L (ref 3.5–5.1)
Sodium: 138 mmol/L (ref 135–145)

## 2022-02-22 LAB — GLUCOSE, CAPILLARY
Glucose-Capillary: 93 mg/dL (ref 70–99)
Glucose-Capillary: 100 mg/dL — ABNORMAL HIGH (ref 70–99)
Glucose-Capillary: 107 mg/dL — ABNORMAL HIGH (ref 70–99)
Glucose-Capillary: 98 mg/dL (ref 70–99)

## 2022-02-22 LAB — TSH: TSH: 7.364 u[IU]/mL — ABNORMAL HIGH (ref 0.350–4.500)

## 2022-02-22 LAB — MAGNESIUM: Magnesium: 2 mg/dL (ref 1.7–2.4)

## 2022-02-22 MED ORDER — AMIODARONE IV BOLUS ONLY 150 MG/100ML
150.0000 mg | Freq: Once | INTRAVENOUS | Status: AC
  Administered 2022-02-22: 150 mg via INTRAVENOUS
  Filled 2022-02-22: qty 100

## 2022-02-22 MED ORDER — AMIODARONE HCL 200 MG PO TABS: 400.0000 mg | ORAL_TABLET | Freq: Every day | ORAL | Status: DC

## 2022-02-22 MED ORDER — METOPROLOL SUCCINATE ER 25 MG PO TB24
25.0000 mg | ORAL_TABLET | Freq: Every day | ORAL | 5 refills | Status: DC
  Filled 2022-02-22: qty 30, 30d supply, fill #0
  Filled 2022-03-21: qty 30, 30d supply, fill #1

## 2022-02-22 MED ORDER — ASPIRIN 325 MG PO TBEC: 325.0000 mg | DELAYED_RELEASE_TABLET | Freq: Every day | ORAL | Status: DC

## 2022-02-22 MED ORDER — ENOXAPARIN SODIUM 60 MG/0.6ML IJ SOSY
50.0000 mg | PREFILLED_SYRINGE | INTRAMUSCULAR | Status: DC
  Administered 2022-02-23: 50 mg via SUBCUTANEOUS
  Filled 2022-02-22: qty 0.6

## 2022-02-22 MED ORDER — POTASSIUM CHLORIDE CRYS ER 20 MEQ PO TBCR
30.0000 meq | EXTENDED_RELEASE_TABLET | Freq: Two times a day (BID) | ORAL | Status: AC
  Administered 2022-02-22 (×2): 30 meq via ORAL
  Filled 2022-02-22 (×2): qty 1

## 2022-02-22 MED ORDER — AMIODARONE HCL 200 MG PO TABS
400.0000 mg | ORAL_TABLET | Freq: Two times a day (BID) | ORAL | Status: DC
  Administered 2022-02-22 – 2022-02-25 (×7): 400 mg via ORAL
  Filled 2022-02-22 (×7): qty 2

## 2022-02-22 MED ORDER — HYDROCORTISONE 1 % EX CREA
TOPICAL_CREAM | Freq: Three times a day (TID) | CUTANEOUS | 0 refills | Status: DC | PRN
  Filled 2022-02-22: qty 28, 15d supply, fill #0

## 2022-02-22 MED ORDER — TRAMADOL HCL 50 MG PO TABS
50.0000 mg | ORAL_TABLET | Freq: Four times a day (QID) | ORAL | 0 refills | Status: AC | PRN
  Filled 2022-02-22: qty 28, 7d supply, fill #0

## 2022-02-22 NOTE — Progress Notes (Addendum)
[address]       [place] [address]             [phone]      6 Days Post-Op Procedure(s) (LRB): MITRAL VALVE REPAIR (MVR) USING 34 MM EDWARDS PHYSIO II ANNULOPLASTY RING (N/A) TRANSESOPHAGEAL ECHOCARDIOGRAM (TEE) (N/A) Subjective:  Up in the bedside chair. Feeling better and said she had no difficulty with ambulation or transfers. Itching at the sternal incision has improved. She said she would like to go home today.   Remains in SR. Wt 3lbs below pre-op.   Objective: Vital signs in last 24 hours: Temp:  [97.5 F (36.4 C)-98.8 F (37.1 C)] 98.4 F (36.9 C) (01/30 0401) Pulse Rate:  [67-76] 73 (01/30 0401) Cardiac Rhythm: Normal sinus rhythm (01/29 2010) Resp:  [16-20] 18 (01/30 0401) BP: (126-158)/(67-89) 126/67 (01/30 0401) SpO2:  [92 %-98 %] 96 % (01/30 0401) Weight:  [102.4 kg] 102.4 kg (01/30 0523)     Intake/Output from previous day: 01/29 0701 - 01/30 0700 In: -  Out: 450 [Urine:450] Intake/Output this shift: No intake/output data recorded.  General appearance: alert, cooperative, and mild distress Neurologic: intact Heart: regular rhythm, few PVC's.  Stable SR on monitor. Lungs: clear to auscultation bilaterally Abdomen: soft, NT.  Extremities: warm and well perfused Wound: the sternotomy incision is well approximated and dry. The retention sutures were trimmed yesterday to relieve pressure to skin.    Lab Results: Recent Labs    02/20/22 0353  WBC 11.0*  HGB 9.4*  HCT 28.3*  PLT 161    BMET:  Recent Labs    02/21/22 0829 02/22/22 0232  NA 138 138  K 3.5 3.6  CL 106 103  CO2 21* 22  GLUCOSE 103* 90  BUN 7 8  CREATININE 0.68 0.72  CALCIUM 8.7* 9.1     PT/INR: No results for input(s): "LABPROT", "INR" in the last 72 hours. ABG    Component Value Date/Time   PHART 7.398 02/16/2022 2016   HCO3 17.6 (L) 02/16/2022 2016   TCO2 19 (L) 02/16/2022 2016   ACIDBASEDEF 6.0 (H) 02/16/2022 2016   O2SAT 98  02/16/2022 2016   CBG (last 3)  Recent Labs    02/21/22 1700 02/21/22 2129 02/22/22 0615  GLUCAP 88 102* 93     Assessment/Plan: S/P Procedure(s) (LRB): MITRAL VALVE REPAIR (MVR) USING 34 MM EDWARDS PHYSIO II ANNULOPLASTY RING (N/A) TRANSESOPHAGEAL ECHOCARDIOGRAM (TEE) (N/A)  -POD6 MV repair for severe MR. BP stable and has stable sinus node function.  She is currently on low-dose metoprolol.  -Hypertension-  SBP 125-130 on amlodipine and low-dose metoprolol  -Volume excess-Wt now 3Lbs below pre-op.  Resume her HCTZ at home.  -Expected ABL anemia- Stable, on Fe++ supp. Monitor.   -GI- tolerating diet, appropriate bowel function.  -RENAL- normal function at baseline. K+ 3.6 .  -Skin irritation at sternal retention sutures- improved, continue topical hydrocortisone PRN  -Disposition- Plan for discharge to home today. Rollator requested. Instructions given. Follow up arranged.    LOS: 6 days    Antony Odea, [place] [phone] 02/22/2022    09:40 Developed atrial fibrillation with VR 130-150's while walking this morning.  Converted back to SR within 1 1/2 hr without treatment.  Discharge canceled. Will load with IV amiodarone '150mg'$  and start oral amioat '400mg'$  BID. Give additional K+. Check Mg++.   Macarthur Critchley, PA-C  Agree with above Short run of afib today Will watch for 24hrs  Lorelie Biermann O  Aadi Bordner

## 2022-02-22 NOTE — Progress Notes (Addendum)
Occupational Therapy Treatment Patient Details Name: Melissa Riley MRN: [id number] DOB: November 24, 1965 Today's Date: 02/22/2022   History of present illness 57 y.o. female presents to [facility] hospital on 02/16/2022 for mitral valve repair. Pt underwent sternotomy and mitral valve repair on 1/24. PMH includes heart murmur, HTN, MR.   OT comments  Session limited to education d/t pt experiencing episode of Afib with RVR. Reviewed all sternal precautions in relation to ADLs and functional mobility. Additionally reviewed energy conservation techniques as well as all LB AE with demo provided. Issued pt handouts to increase carryover. Pt would continue to benefit from skilled occupational therapy while admitted and after d/c to address the below listed limitations in order to improve overall functional mobility and facilitate independence with BADL participation. DC plan remains appropriate, will follow acutely per POC.     Recommendations for follow up therapy are one component of a multi-disciplinary discharge planning process, led by the attending physician.  Recommendations may be updated based on patient status, additional functional criteria and insurance authorization.    Follow Up Recommendations  No OT follow up     Assistance Recommended at Discharge Intermittent Supervision/Assistance  Patient can return home with the following  A little help with bathing/dressing/bathroom;Assistance with cooking/housework;Assist for transportation   Equipment Recommendations  Other (comment) (rollator)    Recommendations for Other Services      Precautions / Restrictions Precautions Precautions: Sternal Precaution Booklet Issued: No Precaution Comments: issued pt handout and verbally reviewed all sternal precautions in relation to ADLs and functional mobility Restrictions Weight Bearing Restrictions: No Other Position/Activity Restrictions: sternal precautions       Mobility Bed Mobility                General bed mobility comments: session limited to bed level d/t episode of Afib with RVR, pt able to verbalize bed mobility technique via log roll method    Transfers                   General transfer comment: session limited to bed level d/t episode of Afib with RVR, pt able to verbalize sit>stand method d/t sternal precautions     Balance                                           ADL either performed or assessed with clinical judgement   ADL                                         General ADL Comments: reviewed all compensatory methods for ADL participation d/t sternal precautions as well as LB AE for energy conservation, issued pt handouts to increase carryover    Extremity/Trunk Assessment Upper Extremity Assessment Upper Extremity Assessment: Overall WFL for tasks assessed   Lower Extremity Assessment Lower Extremity Assessment: Defer to PT evaluation        Vision Baseline Vision/History: 0 No visual deficits     Perception Perception Perception: Not tested   Praxis Praxis Praxis: Not tested    Cognition Arousal/Alertness: Awake/alert Behavior During Therapy: WFL for tasks assessed/performed Overall Cognitive Status: Within Functional Limits for tasks assessed  Exercises      Shoulder Instructions       General Comments HR fluctuacting in 160s from bed level, session focused on education only    Pertinent Vitals/ Pain       Pain Assessment Pain Assessment: No/denies pain  Home Living                                          Prior Functioning/Environment              Frequency  Min 2X/week        Progress Toward Goals  OT Goals(current goals can now be found in the care plan section)  Progress towards OT goals: Progressing toward goals (limited this session by Afib but otherwise progressing)  Acute Rehab  OT Goals Patient Stated Goal: to go home OT Goal Formulation: With patient Time For Goal Achievement: 03/07/22 Potential to Achieve Goals: Good  Plan Discharge plan remains appropriate;Frequency remains appropriate    Co-evaluation                 AM-PAC OT "6 Clicks" Daily Activity     Outcome Measure   Help from another person eating meals?: None Help from another person taking care of personal grooming?: A Little Help from another person toileting, which includes using toliet, bedpan, or urinal?: A Little Help from another person bathing (including washing, rinsing, drying)?: A Little Help from another person to put on and taking off regular upper body clothing?: A Little Help from another person to put on and taking off regular lower body clothing?: A Little 6 Click Score: 19    End of Session    OT Visit Diagnosis: Unsteadiness on feet (R26.81)   Activity Tolerance Patient tolerated treatment well   Patient Left in bed;with call bell/phone within reach;with nursing/sitter in room   Nurse Communication  (nurse present during session)        Time: 4818-5631 OT Time Calculation (min): 17 min  Charges: OT General Charges $OT Visit: 1 Visit OT Treatments $Self Care/Home Management : 8-22 mins  Harley Alto., COTA/L Acute Rehabilitation Services [phone]   Precious Haws 02/22/2022, 9:41 AM

## 2022-02-22 NOTE — Progress Notes (Signed)
  Amiodarone Drug - Drug Interaction Consult Note  Recommendations: Inpatient medications and home medication list reviewed. The only potential interaction at this time is increased risk of bradycardia with concomitant metoprolol therapy. No dose changes are indicated, just close monitoring of HR.  Amiodarone is metabolized by the cytochrome P450 system and therefore has the potential to cause many drug interactions. Amiodarone has an average plasma half-life of 50 days (range 20 to 100 days).   There is potential for drug interactions to occur several weeks or months after stopping treatment and the onset of drug interactions may be slow after initiating amiodarone.   '[]'$  Statins: Increased risk of myopathy. Simvastatin- restrict dose to '20mg'$  daily. Other statins: counsel patients to report any muscle pain or weakness immediately.  '[]'$  Anticoagulants: Amiodarone can increase anticoagulant effect. Consider warfarin dose reduction. Patients should be monitored closely and the dose of anticoagulant altered accordingly, remembering that amiodarone levels take several weeks to stabilize.  '[]'$  Antiepileptics: Amiodarone can increase plasma concentration of phenytoin, the dose should be reduced. Note that small changes in phenytoin dose can result in large changes in levels. Monitor patient and counsel on signs of toxicity.  '[x]'$  Beta blockers: increased risk of bradycardia, AV block and myocardial depression. Sotalol - avoid concomitant use.  '[]'$   Calcium channel blockers (diltiazem and verapamil): increased risk of bradycardia, AV block and myocardial depression.  '[]'$   Cyclosporine: Amiodarone increases levels of cyclosporine. Reduced dose of cyclosporine is recommended.  '[]'$  Digoxin dose should be halved when amiodarone is started.  '[]'$  Diuretics: increased risk of cardiotoxicity if hypokalemia occurs.  '[]'$  Oral hypoglycemic agents (glyburide, glipizide, glimepiride): increased risk of hypoglycemia.  Patient's glucose levels should be monitored closely when initiating amiodarone therapy.   '[]'$  Drugs that prolong the QT interval:  Torsades de pointes risk may be increased with concurrent use - avoid if possible.  Monitor QTc, also keep magnesium/potassium WNL if concurrent therapy can't be avoided.  Antibiotics: e.g. fluoroquinolones, erythromycin.  Antiarrhythmics: e.g. quinidine, procainamide, disopyramide, sotalol.  Antipsychotics: e.g. phenothiazines, haloperidol.   Lithium, tricyclic antidepressants, and methadone.  Thank You,  Luisa Hart, PharmD, BCPS Clinical Pharmacist 02/22/2022 9:57 AM   Please refer to [facility] for pharmacy phone number

## 2022-02-22 NOTE — Progress Notes (Signed)
CARDIAC REHAB PHASE I   Stopped by to offer walk and provide home education. Pt with a-fib RVR, RN at bedside. Will continue to follow.    [id number] Vanessa Barbara, RN BSN 02/22/2022 9:03 AM

## 2022-02-22 NOTE — Progress Notes (Signed)
Pt experienced afib with RVR. Pt asymptomatic. EKG obtained and confirmed the afib rhymth. PA on call notified and presented at the bedside. New orders obtained.   Lavenia Atlas, RN

## 2022-02-22 NOTE — Progress Notes (Signed)
Mobility Specialist Progress Note:   02/22/22 1500  Mobility  Activity Ambulated with assistance in hallway  Level of Assistance Standby assist, set-up cues, supervision of patient - no hands on  Assistive Device Four wheel walker  Distance Ambulated (ft) 300 ft  Activity Response Tolerated well  $Mobility charge 1 Mobility   Pt in bed willing to participate in mobility. No complaints of pain. Left in chair with call bell in reach and all needs met.   During Mobility:96 HR Post Mobility:   [place] Specialist Please contact via [place] or  [place] at [phone]

## 2022-02-23 ENCOUNTER — Inpatient Hospital Stay (HOSPITAL_COMMUNITY)
Admit: 2022-02-23 | Discharge: 2022-02-23 | Disposition: A | Discharge status: Home / Self Care | Payer: Commercial Managed Care - HMO | Attending: Physician Assistant | Admitting: Physician Assistant

## 2022-02-23 ENCOUNTER — Other Ambulatory Visit (HOSPITAL_COMMUNITY): Payer: Self-pay

## 2022-02-23 DIAGNOSIS — I4891 Unspecified atrial fibrillation: Secondary | ICD-10-CM

## 2022-02-23 LAB — GLUCOSE, CAPILLARY
Glucose-Capillary: 103 mg/dL — ABNORMAL HIGH (ref 70–99)
Glucose-Capillary: 92 mg/dL (ref 70–99)
Glucose-Capillary: 103 mg/dL — ABNORMAL HIGH (ref 70–99)
Glucose-Capillary: 105 mg/dL — ABNORMAL HIGH (ref 70–99)

## 2022-02-23 LAB — BASIC METABOLIC PANEL
Anion gap: 11 (ref 5–15)
BUN: 12 mg/dL (ref 6–20)
CO2: 23 mmol/L (ref 22–32)
Calcium: 9.1 mg/dL (ref 8.9–10.3)
Chloride: 103 mmol/L (ref 98–111)
Creatinine, Ser: 0.87 mg/dL (ref 0.44–1.00)
GFR, Estimated: >60 mL/min (ref >60)
Glucose, Bld: 108 mg/dL — ABNORMAL HIGH (ref 70–99)
Potassium: 4 mmol/L (ref 3.5–5.1)
Sodium: 137 mmol/L (ref 135–145)

## 2022-02-23 MED ORDER — AMIODARONE HCL 200 MG PO TABS
200.0000 mg | ORAL_TABLET | Freq: Two times a day (BID) | ORAL | 2 refills | Status: DC
  Filled 2022-02-23: qty 60, 24d supply, fill #0
  Filled 2022-03-21: qty 60, 30d supply, fill #1

## 2022-02-23 NOTE — Progress Notes (Signed)
CARDIAC REHAB PHASE I   PRE:  Rate/Rhythm: 71 SR   BP:  Sitting: 130/76      SaO2: 96 RA   MODE:  Ambulation: 150 ft   POST:  Rate/Rhythm: 98 SR   BP:  Sitting: 114/71      SaO2: 98 RA  Ambulated in hall with rollator, tolerated well with no pain, dizziness, and mild SOB towards end of walk. SOB resolved quickly with rest. Returned to chair with call bell and bedside table in reach. Post [facility] education including site care, risk factors, restrictions, heart healthy diet, sternal precautions, IS use at home, home needs at discharge and CRP2 reviewed. All questions and concerns addressed.Will refer to [facility] for CRP2. Will continue to follow.   [id number]  Vanessa Barbara, RN BSN 02/23/2022 9:03 AM

## 2022-02-23 NOTE — Progress Notes (Signed)
ZIO AT applied at hospital.  Dr. Eleonore Chiquito to read.

## 2022-02-23 NOTE — Progress Notes (Signed)
[address]       [place] [address]             [phone]      7 Days Post-Op Procedure(s) (LRB): MITRAL VALVE REPAIR (MVR) USING 34 MM EDWARDS PHYSIO II ANNULOPLASTY RING (N/A) TRANSESOPHAGEAL ECHOCARDIOGRAM (TEE) (N/A) Subjective:  Feels OK. No new concerns.  Has soreness at the sternal incision but this is manageable with Tylenol.  No further atrial fibrillation. She would like to return home today.    Objective: Vital signs in last 24 hours: Temp:  [97.6 F (36.4 C)-99.4 F (37.4 C)] 98.4 F (36.9 C) (01/31 0450) Pulse Rate:  [67-80] 68 (01/31 0450) Cardiac Rhythm: Normal sinus rhythm;Bundle branch block;Heart block (01/30 1900) Resp:  [17-23] 17 (01/31 0620) BP: (108-145)/(49-81) 117/63 (01/31 0450) SpO2:  [94 %-100 %] 94 % (01/31 0450) Weight:  [100.3 kg] 100.3 kg (01/31 0620)     Intake/Output from previous day: 01/30 0701 - 01/31 0700 In: 100 [I.V.:100] Out: 1100 [Urine:1100] Intake/Output this shift: No intake/output data recorded.  General appearance: alert, cooperative, and mild distress Neurologic: intact Heart: regular rhythm, few PVC's.  Stable SR on monitor since the episode of a-fib yesterday morning. Lungs: breath sounds clear.  Abdomen: soft, NT.  Extremities: warm and well perfused, edema resolved Wound: the sternotomy incision is well approximated and dry. Retention sutures in place  Lab Results: No results for input(s): "WBC", "HGB", "HCT", "PLT" in the last 72 hours.  BMET:  Recent Labs    02/22/22 0232 02/23/22 0235  NA 138 137  K 3.6 4.0  CL 103 103  CO2 22 23  GLUCOSE 90 108*  BUN 8 12  CREATININE 0.72 0.87  CALCIUM 9.1 9.1     PT/INR: No results for input(s): "LABPROT", "INR" in the last 72 hours. ABG    Component Value Date/Time   PHART 7.398 02/16/2022 2016   HCO3 17.6 (L) 02/16/2022 2016   TCO2 19 (L) 02/16/2022 2016   ACIDBASEDEF 6.0 (H) 02/16/2022 2016   O2SAT 98 02/16/2022 2016   CBG  (last 3)  Recent Labs    02/22/22 1556 02/22/22 2029 02/23/22 0547  GLUCAP 107* 98 103*     Assessment/Plan: S/P Procedure(s) (LRB): MITRAL VALVE REPAIR (MVR) USING 34 MM EDWARDS PHYSIO II ANNULOPLASTY RING (N/A) TRANSESOPHAGEAL ECHOCARDIOGRAM (TEE) (N/A)  -POD7 MV repair for severe MR. BP and HR stable. Independent with mobility using rolling walker.   -Post-op a-fib- brief episode yesterday morning that converted back to SR before treatment started (~90 min total). Loaded with amiodarone '150mg'$  IV and started on oral load. No further a-fib since that episode. K++ 4.0, Mg++ 2.0.  -Hypertension- Controlled.  SBP 125-130 on amlodipine and low-dose metoprolol  -Volume excess-Wt well below pre-op.  Resume her HCTZ at home.  -Expected ABL anemia- Stable, on Fe++ supp. Monitor.   -GI- tolerating diet, appropriate bowel function.  -RENAL- normal function at baseline. K+ 4.0 .  -Skin irritation at sternal retention sutures- improved, continue topical hydrocortisone PRN  -Disposition- Plan for discharge to home today. Rollator delivered to room Instructions given. Follow up arranged.    LOS: 7 days    Antony Odea, [place] [phone] 02/23/2022

## 2022-02-23 NOTE — Progress Notes (Signed)
Patient feels comfortable leaving tomorrow since she went into A-fib yesterday morning. PA at bedside. Discharge order cancel for today per PA. Patient received medication from TOC.

## 2022-02-23 NOTE — Progress Notes (Signed)
Physical Therapy Treatment Patient Details Name: Melissa Riley MRN: [id number] DOB: 1965-05-10 Today's Date: 02/23/2022   History of Present Illness 57 y.o. female presents [facility] on 02/16/2022 for mitral valve repair. Pt underwent sternotomy and mitral valve repair on 1/24. PMH includes heart murmur, HTN, MR.    PT Comments    Pt greeted up in chair on arrival and agreeable to session with focus on gait with rollator for increased activity tolerance and improved balance/postural reactions. Pt with good adherence throughout mobility to all sternal precautions. Pt demonstrating gait at supervision level with rollator for increased distance and demonstrating good self pacing and improved stability with 4 wheeled walker. Pt with mild c/o L calf tightness resolving with ambulation, RN notified at ed of session. Current plan remains appropriate to address deficits and maximize functional independence. Pt continues to benefit from skilled PT services to progress toward functional mobility goals.    Recommendations for follow up therapy are one component of a multi-disciplinary discharge planning process, led by the attending physician.  Recommendations may be updated based on patient status, additional functional criteria and insurance authorization.  Follow Up Recommendations  Other (comment) (outpatient cardiopulmonary rehab)     Assistance Recommended at Discharge PRN  Patient can return home with the following Assistance with cooking/housework;Assist for transportation   Equipment Recommendations  Rollator (4 wheels)    Recommendations for Other Services       Precautions / Restrictions Precautions Precautions: Sternal Precaution Booklet Issued: No Precaution Comments: issued pt handout and verbally reviewed all sternal precautions in relation to ADLs and functional mobility Restrictions Weight Bearing Restrictions: Yes (sternal precautions) Other Position/Activity  Restrictions: sternal precautions     Mobility  Bed Mobility Overal bed mobility: Modified Independent             General bed mobility comments: OOB pre and post session    Transfers Overall transfer level: Needs assistance Equipment used: None Transfers: Sit to/from Stand Sit to Stand: Supervision           General transfer comment: pt standing without AD and then stepping toward rollator    Ambulation/Gait Ambulation/Gait assistance: Supervision Gait Distance (Feet): 300 Feet Assistive device: Rollator (4 wheels) Gait Pattern/deviations: Step-through pattern Gait velocity: reduced     General Gait Details: slowed step-through gait, no LOB, pt endorsing some relief of LLE pain during gait   Stairs             Wheelchair Mobility    Modified Rankin (Stroke Patients Only)       Balance Overall balance assessment: Needs assistance Sitting-balance support: No upper extremity supported, Feet supported Sitting balance-Leahy Scale: Good     Standing balance support: No upper extremity supported, During functional activity Standing balance-Leahy Scale: Good                              Cognition Arousal/Alertness: Awake/alert Behavior During Therapy: WFL for tasks assessed/performed Overall Cognitive Status: Within Functional Limits for tasks assessed                                          Exercises Other Exercises Other Exercises: reviewed LE warm up/ therex to complte throughout day, AP, LAQ, QS, hip abd, marching in sitting, pt able to demo all exercises.    General Comments General comments (  skin integrity, edema, etc.): VSS on RA, HR 80s-90s throughout NSR      Pertinent Vitals/Pain Pain Assessment Pain Assessment: Faces Faces Pain Scale: Hurts little more Pain Location: L calf Pain Descriptors / Indicators: Discomfort, Squeezing, Tightness Pain Intervention(s): Monitored during session, Limited  activity within patient's tolerance, Other (comment) (informed RN)    Home Living                          Prior Function            PT Goals (current goals can now be found in the care plan section) Acute Rehab PT Goals Patient Stated Goal: to go home tomorrow PT Goal Formulation: With patient Time For Goal Achievement: 03/07/22 Progress towards PT goals: Progressing toward goals    Frequency    Min 3X/week      PT Plan      Co-evaluation              AM-PAC PT "6 Clicks" Mobility   Outcome Measure  Help needed turning from your back to your side while in a flat bed without using bedrails?: None Help needed moving from lying on your back to sitting on the side of a flat bed without using bedrails?: None Help needed moving to and from a bed to a chair (including a wheelchair)?: A Little Help needed standing up from a chair using your arms (e.g., wheelchair or bedside chair)?: A Little Help needed to walk in hospital room?: A Little Help needed climbing 3-5 steps with a railing? : A Little 6 Click Score: 20    End of Session   Activity Tolerance: Patient tolerated treatment well Patient left: with call bell/phone within reach;in chair Nurse Communication: Mobility status;Other (comment) (pain in L calf) PT Visit Diagnosis: Other abnormalities of gait and mobility (R26.89)     Time: 2831-5176 PT Time Calculation (min) (ACUTE ONLY): 20 min  Charges:  $Therapeutic Activity: 8-22 mins                     Orrin Yurkovich R. PTA Acute Rehabilitation Services Office: [place] 02/23/2022, 12:27 PM

## 2022-02-24 ENCOUNTER — Other Ambulatory Visit (HOSPITAL_COMMUNITY): Payer: Self-pay

## 2022-02-24 ENCOUNTER — Telehealth (HOSPITAL_COMMUNITY): Payer: Self-pay | Admitting: Pharmacy Technician

## 2022-02-24 LAB — GLUCOSE, CAPILLARY
Glucose-Capillary: 103 mg/dL — ABNORMAL HIGH (ref 70–99)
Glucose-Capillary: 91 mg/dL (ref 70–99)
Glucose-Capillary: 80 mg/dL (ref 70–99)
Glucose-Capillary: 110 mg/dL — ABNORMAL HIGH (ref 70–99)

## 2022-02-24 LAB — BASIC METABOLIC PANEL
Anion gap: 11 (ref 5–15)
BUN: 13 mg/dL (ref 6–20)
CO2: 23 mmol/L (ref 22–32)
Calcium: 9.1 mg/dL (ref 8.9–10.3)
Chloride: 103 mmol/L (ref 98–111)
Creatinine, Ser: 0.93 mg/dL (ref 0.44–1.00)
GFR, Estimated: >60 mL/min (ref >60)
Glucose, Bld: 119 mg/dL — ABNORMAL HIGH (ref 70–99)
Potassium: 3.8 mmol/L (ref 3.5–5.1)
Sodium: 137 mmol/L (ref 135–145)

## 2022-02-24 LAB — MAGNESIUM: Magnesium: 2.2 mg/dL (ref 1.7–2.4)

## 2022-02-24 MED ORDER — POTASSIUM CHLORIDE CRYS ER 20 MEQ PO TBCR
40.0000 meq | EXTENDED_RELEASE_TABLET | Freq: Once | ORAL | Status: AC
  Administered 2022-02-24: 40 meq via ORAL
  Filled 2022-02-24: qty 2

## 2022-02-24 MED ORDER — APIXABAN 5 MG PO TABS
5.0000 mg | ORAL_TABLET | Freq: Two times a day (BID) | ORAL | Status: DC
  Administered 2022-02-24 – 2022-02-25 (×3): 5 mg via ORAL
  Filled 2022-02-24 (×3): qty 1

## 2022-02-24 MED ORDER — AMIODARONE IV BOLUS ONLY 150 MG/100ML
150.0000 mg | Freq: Once | INTRAVENOUS | Status: AC
  Administered 2022-02-24: 150 mg via INTRAVENOUS
  Filled 2022-02-24: qty 100

## 2022-02-24 MED ORDER — APIXABAN 5 MG PO TABS
5.0000 mg | ORAL_TABLET | Freq: Two times a day (BID) | ORAL | 2 refills | Status: DC
  Filled 2022-02-24: qty 60, 30d supply, fill #0
  Filled 2022-03-21: qty 60, 30d supply, fill #1

## 2022-02-24 MED ORDER — ASPIRIN 81 MG PO TBEC
81.0000 mg | DELAYED_RELEASE_TABLET | Freq: Every day | ORAL | Status: DC
  Administered 2022-02-24: 81 mg via ORAL
  Filled 2022-02-24: qty 1

## 2022-02-24 NOTE — TOC Progression Note (Addendum)
Transition of Care [facility]) - Progression Note    Patient Details  Name: SOHANA AUSTELL MRN: [id number] Date of Birth: [birth year]  Transition of [facility]) CM/SW [place], RN Phone Number: 02/24/2022, 1:44 PM  Clinical Narrative:     Patient was supposed to be discharged today, 43 wheel walker with seat ordered via [employer]. However, patient went into Afib with RVR. May go later this afternoon if HR in control Will have to be started on anticoagulant. Will give patient eliquis card .  Expected Discharge Plan: Home/Self Care Barriers to Discharge: No Barriers Identified  Expected Discharge Plan and Services       Living arrangements for the past 2 months: [place], Homeless Shelter Expected Discharge Date: 02/23/22               DME Arranged: Gilford Rile rolling with seat DME Agency: [employer] Date DME Agency Contacted: 02/24/22 Time DME Agency Contacted: [phone] Representative spoke with at DME Agency: [place] Determinants of Health ([place]) Interventions SDOH Screenings   Food Insecurity: No Food Insecurity (02/17/2022)  Housing: Low Risk  (02/17/2022)  Transportation Needs: No Transportation Needs (02/17/2022)  Utilities: Not At Risk (02/17/2022)  Depression (PHQ2-9): Low Risk  (02/10/2022)  Tobacco Use: Low Risk  (02/17/2022)    Readmission Risk Interventions     No data to display

## 2022-02-24 NOTE — Progress Notes (Signed)
Physical Therapy Treatment Patient Details Name: Melissa Riley MRN: [id number] DOB: 03-12-1965 Today's Date: 02/24/2022   History of Present Illness 57 y.o. female presents to [facility] hospital on 02/16/2022 for mitral valve repair. Pt underwent sternotomy and mitral valve repair on 1/24. PMH includes heart murmur, HTN, MR.    PT Comments    Pt greeted semi-reclined in bed and eager for continued OOB mobility. Pt continues to demonstrate good adherence to all sternal precautions throughout mobility. Pt continues to benefit from 4 wheeled walker use as pt demonstrating gait in room without AD with noted instability and increased lateral postural sway with pt verbalizing understanding of benefits to use of AD. Educated pt on correct IS use with pt able to return demo x8 with correct technique and pulling 1225m. Pt HR stable throughout and continued education re; importance and benefits of continued mobility with pt verbalizing understanding.  Pt continues to benefit from skilled PT services to progress toward functional mobility goals.    Recommendations for follow up therapy are one component of a multi-disciplinary discharge planning process, led by the attending physician.  Recommendations may be updated based on patient status, additional functional criteria and insurance authorization.  Follow Up Recommendations  Other (comment) (outpatient cardiopulmonary rehab)     Assistance Recommended at Discharge PRN  Patient can return home with the following Assistance with cooking/housework;Assist for transportation   Equipment Recommendations  Rollator (4 wheels)    Recommendations for Other Services       Precautions / Restrictions Precautions Precautions: Sternal Precaution Booklet Issued: No Precaution Comments: issued pt handout and verbally reviewed all sternal precautions in relation to ADLs and functional mobility Restrictions Weight Bearing Restrictions: No Other Position/Activity  Restrictions: sternal precautions     Mobility  Bed Mobility Overal bed mobility: Needs Assistance Bed Mobility: Supine to Sit, Sit to Supine     Supine to sit: Modified independent (Device/Increase time) Sit to supine: Modified independent (Device/Increase time)   General bed mobility comments: light cues on return to bed to side [facility] into bed    Transfers Overall transfer level: Needs assistance Equipment used: None Transfers: Sit to/from Stand Sit to Stand: Supervision           General transfer comment: pt standing without AD and then stepping toward rollator    Ambulation/Gait Ambulation/Gait assistance: Supervision Gait Distance (Feet): 350 Feet Assistive device: Rollator (4 wheels), None Gait Pattern/deviations: Step-through pattern Gait velocity: reduced     General Gait Details: improved gait speed this date, pt endorsing improved SOB, with DOE 2/4, HR up to 84bpm NSR during activity, pt able to demonstrate gait without AD in room without LOB, increased general stability and gait speed with use of 4 wheeled walker   Stairs             Wheelchair Mobility    Modified Rankin (Stroke Patients Only)       Balance Overall balance assessment: Needs assistance Sitting-balance support: No upper extremity supported, Feet supported Sitting balance-Leahy Scale: Good     Standing balance support: No upper extremity supported, During functional activity Standing balance-Leahy Scale: Good                              Cognition Arousal/Alertness: Awake/alert Behavior During Therapy: WFL for tasks assessed/performed Overall Cognitive Status: Within Functional Limits for tasks assessed  Exercises Other Exercises Other Exercises: instuction on proper IS use, able to perform correctly up to 1242m x8    General Comments General comments (skin integrity, edema,  etc.): VSS on RA      Pertinent Vitals/Pain Pain Assessment Pain Assessment: No/denies pain Pain Intervention(s): Monitored during session    Home Living                          Prior Function            PT Goals (current goals can now be found in the care plan section) Acute Rehab PT Goals Patient Stated Goal: to get home PT Goal Formulation: With patient Time For Goal Achievement: 03/07/22 Progress towards PT goals: Progressing toward goals    Frequency    Min 3X/week      PT Plan      Co-evaluation              AM-PAC PT "6 Clicks" Mobility   Outcome Measure  Help needed turning from your back to your side while in a flat bed without using bedrails?: None Help needed moving from lying on your back to sitting on the side of a flat bed without using bedrails?: None Help needed moving to and from a bed to a chair (including a wheelchair)?: A Little Help needed standing up from a chair using your arms (e.g., wheelchair or bedside chair)?: A Little Help needed to walk in hospital room?: A Little Help needed climbing 3-5 steps with a railing? : A Little 6 Click Score: 20    End of Session   Activity Tolerance: Patient tolerated treatment well Patient left: with call bell/phone within reach;in bed;with family/visitor present Nurse Communication: Mobility status PT Visit Diagnosis: Other abnormalities of gait and mobility (R26.89)     Time: 13112-1624PT Time Calculation (min) (ACUTE ONLY): 16 min  Charges:  $Therapeutic Activity: 8-22 mins                     Kenrick Pore R. PTA Acute Rehabilitation Services Office: [place]2/01/2022, 4:11 PM

## 2022-02-24 NOTE — Progress Notes (Signed)
Mobility Specialist Progress Note:   02/24/22 1200  Mobility  Activity Ambulated with assistance in hallway  Level of Assistance Standby assist, set-up cues, supervision of patient - no hands on  Assistive Device Four wheel walker  Distance Ambulated (ft) 200 ft  Activity Response Tolerated well  $Mobility charge 1 Mobility   Pt EOB asking to ambulate. No complaints of pain. Left EOB with call bell in reach and all needs met.   Melissa Riley Ameya Kutz Mobility Specialist Please contact via [employer] or  Rehab Office at [phone]

## 2022-02-24 NOTE — TOC Benefit Eligibility Note (Signed)
Patient Teacher, English as a foreign language completed.    The patient is currently admitted and upon discharge could be taking Eliquis 5 mg.  Prior Authorization Required  The patient is insured through [place], [place] Patient Advocate Specialist [place] Patient Advocate Team Direct Number: [phone]  Fax: [phone]

## 2022-02-24 NOTE — Telephone Encounter (Signed)
Patient Advocate Encounter  Prior Authorization for Eliquis '5MG'$  tablets has been approved.    PA# [id number] Key: [id number] Effective dates: 02/24/2022 through 02/24/2023  Patients co-pay is $296.01 due to a $500.00 deductible.  Can use a [place], [place] Patient Advocate Specialist [place] Patient Advocate Team Direct Number: [phone]  Fax: [phone]

## 2022-02-24 NOTE — Progress Notes (Addendum)
[address]       [place] [address]             [phone]      8 Days Post-Op Procedure(s) (LRB): MITRAL VALVE REPAIR (MVR) USING 34 MM EDWARDS PHYSIO II ANNULOPLASTY RING (N/A) TRANSESOPHAGEAL ECHOCARDIOGRAM (TEE) (N/A) Subjective:  Feels OK. No new problems or concerns.  JR overnight but reverted back to a-fib in the 150's during encounter this morning.     Objective: Vital signs in last 24 hours: Temp:  [97.8 F (36.6 C)-98.9 F (37.2 C)] 98.9 F (37.2 C) (02/01 0450) Pulse Rate:  [70] 70 (01/31 1932) Cardiac Rhythm: Atrial fibrillation;Other (Comment) (02/01 0813) Resp:  [13-23] 13 (02/01 0450) BP: (101-130)/(48-68) 101/56 (02/01 0450) SpO2:  [99 %-100 %] 100 % (02/01 0450) Weight:  [99.5 kg] 99.5 kg (02/01 0557)     Intake/Output from previous day: No intake/output data recorded. Intake/Output this shift: Total I/O In: 120 [P.O.:120] Out: -   General appearance: alert, cooperative, and mild distress Neurologic: intact Heart: JR last night, AF in the 150's currently.  Lungs: breath sounds clear.  Abdomen: soft, NT.  Extremities: warm and well perfused, edema resolved Wound: the sternotomy incision is well approximated and dry. Retention sutures in place  Lab Results: No results for input(s): "WBC", "HGB", "HCT", "PLT" in the last 72 hours.  BMET:  Recent Labs    02/22/22 0232 02/23/22 0235  NA 138 137  K 3.6 4.0  CL 103 103  CO2 22 23  GLUCOSE 90 108*  BUN 8 12  CREATININE 0.72 0.87  CALCIUM 9.1 9.1     PT/INR: No results for input(s): "LABPROT", "INR" in the last 72 hours. ABG    Component Value Date/Time   PHART 7.398 02/16/2022 2016   HCO3 17.6 (L) 02/16/2022 2016   TCO2 19 (L) 02/16/2022 2016   ACIDBASEDEF 6.0 (H) 02/16/2022 2016   O2SAT 98 02/16/2022 2016   CBG (last 3)  Recent Labs    02/23/22 1645 02/23/22 2109 02/24/22 0554  GLUCAP 103* 105* 103*     Assessment/Plan: S/P Procedure(s)  (LRB): MITRAL VALVE REPAIR (MVR) USING 34 MM EDWARDS PHYSIO II ANNULOPLASTY RING (N/A) TRANSESOPHAGEAL ECHOCARDIOGRAM (TEE) (N/A)  -POD8 MV repair for severe MR. BP stable. Independent with mobility using rolling walker.   -Post-op a-fib- had an episode again this morning. Will need to start oral anticoagulation, will five amiodarone '150mg'$  IV now and continue oral load. K++ 4.0, Mg++ 2.0 yesterday.   -Hypertension- Controlled.  SBP 100-110 on amlodipine and low-dose metoprolol.  Lisinopril not yet re-started.  -Volume excess-Wt well below pre-op.  Resume her HCTZ at home.  -Expected ABL anemia- Stable, on Fe++ supp. Monitor.   -GI- tolerating diet, appropriate bowel function.  -RENAL- normal function at baseline. K+ 4.0 .  -Skin irritation at sternal retention sutures- improved, continue topical hydrocortisone PRN  -Disposition- Planning for discharge to home today until a-fib returned. Need to get heart rate controlled before discharge.   Rollator delivered to room Instructions given. Follow up arranged.    LOS: 8 days    Antony Odea, PA-C [phone] 02/24/2022  Afib again today.  Back in sinus Starting anticoagulation Dispo [place]

## 2022-02-24 NOTE — Progress Notes (Signed)
CARDIAC REHAB PHASE I    Stopped by to offer walk. Pt back in a-fib RVR. Pt very discouraged, as she was hopeful for discharge home today. Word of encouragement provided. Will return later today for ambulation, as time and pt condition permits. Will continue to follow.   [id number]        Vanessa Barbara, RN BSN 02/24/2022 9:48 AM

## 2022-02-24 NOTE — Progress Notes (Signed)
Mobility Specialist Progress Note:   02/24/22 1027  Mobility  Activity Ambulated with assistance in hallway  Level of Assistance Standby assist, set-up cues, supervision of patient - no hands on  Assistive Device Four wheel walker  Distance Ambulated (ft) 200 ft  Activity Response Tolerated well  $Mobility charge 1 Mobility   Pre- Mobility: 65 HR During Mobility: 78 HR Post Mobility: 73 HR  Pt in bed asking to use restroom them ambulate in hallway. No complaints of pain. Left EOB with call bell in reach and all needs met.   Gareth [place] Lain [place] Mobility Specialist Please contact via [employer] or  Rehab Office at [phone]

## 2022-02-25 LAB — GLUCOSE, CAPILLARY
Glucose-Capillary: 98 mg/dL (ref 70–99)
Glucose-Capillary: 81 mg/dL (ref 70–99)

## 2022-02-25 MED FILL — Sodium Chloride IV Soln 0.9%: INTRAVENOUS | Qty: 2000 | Status: AC

## 2022-02-25 MED FILL — Potassium Chloride Inj 2 mEq/ML: INTRAVENOUS | Qty: 40 | Status: AC

## 2022-02-25 MED FILL — Mannitol IV Soln 20%: INTRAVENOUS | Qty: 500 | Status: AC

## 2022-02-25 MED FILL — Lidocaine HCl Local Preservative Free (PF) Inj 2%: INTRAMUSCULAR | Qty: 14 | Status: AC

## 2022-02-25 MED FILL — Electrolyte-R (PH 7.4) Solution: INTRAVENOUS | Qty: 5000 | Status: AC

## 2022-02-25 MED FILL — Heparin Sodium (Porcine) Inj 1000 Unit/ML: INTRAMUSCULAR | Qty: 10 | Status: AC

## 2022-02-25 MED FILL — Sodium Bicarbonate IV Soln 8.4%: INTRAVENOUS | Qty: 50 | Status: AC

## 2022-02-25 MED FILL — Heparin Sodium (Porcine) Inj 1000 Unit/ML: Qty: 1000 | Status: AC

## 2022-02-25 NOTE — Progress Notes (Signed)
CARDIAC REHAB PHASE I   PRE:  Rate/Rhythm: 81 SR  BP:  Sitting: yes      SaO2: 98  MODE:  Ambulation: 150 ft   POST:  Rate/Rhythm: 77 SR  BP:  Sitting: yes      SaO2: 99  Pt tolerated walking  well and amb 150 ft with rollator,  independently. Pt denies CP, SOB, or dizziness throughout walk. Plans for discharge today. Pt has no questions at this time. Pt left sitting on the side of the bed with bedside table in front of her with call bell in reach.  [place], RRT, BSRT 02/25/2022 9:17 AM

## 2022-02-25 NOTE — TOC Transition Note (Signed)
Transition of Care [facility]) - CM/SW Discharge Note   Patient Details  Name: [name] MRN: [id number] Date of Birth: 30-Jan-1965  [facility] of [facility]) CM/SW Contact:  Verdell Carmine, RN Phone Number: 02/25/2022, 8:28 AM   Clinical Narrative:     Discharging today, no further needs voiced. Spoke with patient in room yesterday and gave eliquis card  Final next level of care: Home/Self Care Barriers to Discharge: No Barriers Identified   Patient Goals and CMS Choice      Discharge Placement                         Discharge Plan and Services Additional resources added to the After Visit Summary for                  DME Arranged: Walker rolling with seat DME Agency: [employer] Date DME Agency Contacted: 02/24/22 Time DME Agency Contacted: [phone] Representative spoke with at DME Agency: [place] ([place]) Interventions SDOH Screenings   Food Insecurity: No Food Insecurity (02/17/2022)  Housing: Low Risk  (02/17/2022)  Transportation Needs: No Transportation Needs (02/17/2022)  Utilities: Not At Risk (02/17/2022)  Depression (PHQ2-9): Low Risk  (02/10/2022)  Tobacco Use: Low Risk  (02/17/2022)     Readmission Risk Interventions     No data to display

## 2022-02-25 NOTE — Progress Notes (Addendum)
Discharge instructions reviewed with pt and family at bedside.  Copy of instructions given to pt. Script filled by [facility] and was delivered to pt in her room. Pt has Zio patch monitor on, dressing peeling up on one side, to inquire from Zio rep person about reinforcing dressing with tegaderm dressing before pt leaves.  Pt has other family coming to pick her and her mother up for discharge.   Pt to be d/c'd via wheelchair with belongings, with family.         To be escorted by staff/hospital volunteer.

## 2022-02-25 NOTE — Progress Notes (Signed)
      [address]       [place] [address]             [phone]      9 Days Post-Op Procedure(s) (LRB): MITRAL VALVE REPAIR (MVR) USING 34 MM EDWARDS PHYSIO II ANNULOPLASTY RING (N/A) TRANSESOPHAGEAL ECHOCARDIOGRAM (TEE) (N/A)  Subjective:  Patient is doing well.  She has no new complaints.   Objective: Vital signs in last 24 hours: Temp:  [98 F (36.7 C)-98.6 F (37 C)] 98.2 F (36.8 C) (02/02 0436) Pulse Rate:  [60-143] 63 (02/02 0436) Cardiac Rhythm: Junctional rhythm (02/01 1900) Resp:  [14-23] 19 (02/02 0436) BP: (113-124)/(55-82) 123/55 (02/02 0436) SpO2:  [98 %-100 %] 100 % (02/02 0436)  Intake/Output from previous day: 02/01 0701 - 02/02 0700 In: 683.1 [P.O.:600; I.V.:83.1] Out: -   General appearance: alert, cooperative, and no distress Heart: regular rate and rhythm Lungs: clear to auscultation bilaterally Abdomen: soft, non-tender; bowel sounds normal; no masses,  no organomegaly Extremities: edema trace Wound: clean and dry  Lab Results: No results for input(s): "WBC", "HGB", "HCT", "PLT" in the last 72 hours. BMET:  Recent Labs    02/23/22 0235 02/24/22 0849  NA 137 137  K 4.0 3.8  CL 103 103  CO2 23 23  GLUCOSE 108* 119*  BUN 12 13  CREATININE 0.87 0.93  CALCIUM 9.1 9.1    PT/INR: No results for input(s): "LABPROT", "INR" in the last 72 hours. ABG    Component Value Date/Time   PHART 7.398 02/16/2022 2016   HCO3 17.6 (L) 02/16/2022 2016   TCO2 19 (L) 02/16/2022 2016   ACIDBASEDEF 6.0 (H) 02/16/2022 2016   O2SAT 98 02/16/2022 2016   CBG (last 3)  Recent Labs    02/24/22 1821 02/24/22 2107 02/25/22 0650  GLUCAP 80 110* 98    Assessment/Plan: S/P Procedure(s) (LRB): MITRAL VALVE REPAIR (MVR) USING 34 MM EDWARDS PHYSIO II ANNULOPLASTY RING (N/A) TRANSESOPHAGEAL ECHOCARDIOGRAM (TEE) (N/A)  CV- PAF/Junctional rhythm- currently NSR in the 60s- continue Lopressor, Amiodarone, ZIO patch in place on  Eliquis Pulm- off oxygen, no acute issues, continue IS Renal-creatinine has been stable, weight is below admission, no lasix needed at this time Dispo- patient stable, will d/c home today   LOS: 9 days    Ellwood Handler, PA-C 02/25/2022

## 2022-03-02 ENCOUNTER — Telehealth: Payer: Self-pay | Admitting: *Deleted

## 2022-03-02 NOTE — Telephone Encounter (Signed)
Patient contacted the office to voice discomfort around bolster sutures. Per patient, sutures have become very bothersome, especially at night. Patient denies pain. States she is not taking Tramadol. Patient denies redness or skin breakdown around sutures. Patient has been using hydrocortisone cream as needed. Advised patient to continue to use cream and to place gauze and tape over suture ends that are snagging shirts. Patient instructed to call our office if skin becomes red or signs of infection are noticed. Patient verbalized understanding.

## 2022-03-06 NOTE — Progress Notes (Signed)
      [address]       [place] [address]             [phone]                    [place] Medical Record [id number] Date of Birth: 26-Apr-1965  Referring: Joycelyn Man, FNP Primary Care: Joycelyn Man, FNP Primary Cardiologist: Evalina Field, MD  S/P Mitral Valve Repair on 02/16/22  PHYSICAL EXAMINATION:  Gen: NAD Neuro: Alert and oriented CV regular, no murmur Chest: incision healing well, small indentations x 6  Resp: Nonlaboured Abd: Soft, ntnd Extr: WWP, 1+ LE edema bilateral  Diagnostic Studies & Laboratory data:    XR today: Lungs well inflated.    Assessment / Plan:   S/P Mitral Valve Repair on 02/16/22  Had periop AF - on amio/eliquis  Red rubber sutures cut today.  Incision looks very good underneath except bottom inch has small amount of discharge, green. Start 5d keflex 500 TID. Glad we put retention sutures in.   Is on ASA 81 and Eliquis.   See me back in 1 week.  Would not restart high dose estrogen until 3 months postop regarding concerns about avoiding blood clots until valve repair healed in completely.   Pierre Bali Fortunato Nordin 03/06/2022 4:46 PM

## 2022-03-09 ENCOUNTER — Other Ambulatory Visit: Payer: Self-pay | Admitting: Cardiothoracic Surgery

## 2022-03-09 DIAGNOSIS — Z9889 Other specified postprocedural states: Secondary | ICD-10-CM

## 2022-03-10 ENCOUNTER — Telehealth (HOSPITAL_COMMUNITY): Payer: Self-pay

## 2022-03-10 ENCOUNTER — Encounter: Payer: Self-pay | Admitting: Cardiothoracic Surgery

## 2022-03-10 ENCOUNTER — Other Ambulatory Visit: Payer: Self-pay

## 2022-03-10 ENCOUNTER — Ambulatory Visit
Admission: RE | Admit: 2022-03-10 | Discharge: 2022-03-10 | Disposition: A | Discharge status: Home / Self Care | Payer: Commercial Managed Care - HMO | Source: Ambulatory Visit | Attending: Cardiothoracic Surgery | Admitting: Cardiothoracic Surgery

## 2022-03-10 ENCOUNTER — Ambulatory Visit (INDEPENDENT_AMBULATORY_CARE_PROVIDER_SITE_OTHER): Payer: Self-pay | Admitting: Cardiothoracic Surgery

## 2022-03-10 VITALS — BP 154/85 | HR 87 | Resp 20 | Wt 216.0 lb

## 2022-03-10 DIAGNOSIS — Z9889 Other specified postprocedural states: Secondary | ICD-10-CM

## 2022-03-10 MED ORDER — CEPHALEXIN 500 MG PO CAPS: 500.0000 mg | ORAL_CAPSULE | Freq: Three times a day (TID) | ORAL | 0 refills | Status: AC

## 2022-03-10 NOTE — Telephone Encounter (Signed)
Called patient to see if she is interested in the Cardiac Rehab Program. Patient expressed interest. Explained scheduling process and went over insurance, patient verbalized understanding. Will contact patient for scheduling once f/u has been completed.  

## 2022-03-10 NOTE — Telephone Encounter (Signed)
Pt insurance is active and benefits verified through [place]. Co-pay $0.00, DED $500.00/$500.00 met, out of pocket $3,050.00/$3,050.00 met, co-insurance 50%. No pre-authorization required. [employer], 2/15/2024b @ 9:40AM, REF#[id number]   How many CR sessions are covered? (36 sessions for TCR, 72 sessions for ICR)36 sessions for TCR Is this a lifetime maximum or an annual maximum? Lifetime Has the member used any of these services to date? No Is there a time limit (weeks/months) on start of program and/or program completion? No     Will contact patient to see if she is interested in the Cardiac Rehab Program. If interested, patient will need to complete follow up appt. Once completed, patient will be contacted for scheduling upon review by the RN Navigator.

## 2022-03-14 ENCOUNTER — Encounter: Payer: Self-pay | Admitting: Nurse Practitioner

## 2022-03-14 ENCOUNTER — Ambulatory Visit: Payer: Commercial Managed Care - HMO | Attending: Nurse Practitioner | Admitting: Nurse Practitioner

## 2022-03-14 VITALS — BP 111/69 | HR 82 | Ht 62.5 in | Wt 221.6 lb

## 2022-03-14 DIAGNOSIS — Z9889 Other specified postprocedural states: Secondary | ICD-10-CM | POA: Diagnosis not present

## 2022-03-14 DIAGNOSIS — I493 Ventricular premature depolarization: Secondary | ICD-10-CM | POA: Diagnosis not present

## 2022-03-14 DIAGNOSIS — I340 Nonrheumatic mitral (valve) insufficiency: Secondary | ICD-10-CM

## 2022-03-14 DIAGNOSIS — I480 Paroxysmal atrial fibrillation: Secondary | ICD-10-CM

## 2022-03-14 DIAGNOSIS — I10 Essential (primary) hypertension: Secondary | ICD-10-CM | POA: Diagnosis not present

## 2022-03-14 LAB — CBC
Hematocrit: 30.6 % — ABNORMAL LOW (ref 34.0–46.6)
Hemoglobin: 10.1 g/dL — ABNORMAL LOW (ref 11.1–15.9)
MCH: 30.5 pg (ref 26.6–33.0)
MCHC: 33 g/dL (ref 31.5–35.7)
MCV: 92 fL (ref 79–97)
Platelets: 319 10*3/uL (ref 150–450)
RBC: 3.31 x10E6/uL — ABNORMAL LOW (ref 3.77–5.28)
RDW: 12.6 % (ref 11.7–15.4)
WBC: 5.6 10*3/uL (ref 3.4–10.8)

## 2022-03-14 NOTE — Patient Instructions (Signed)
Medication Instructions:  Your physician recommends that you continue on your current medications as directed. Please refer to the Current Medication list given to you today.   *If you need a refill on your cardiac medications before your next appointment, please call your pharmacy*   Lab Work: CBC today   If you have labs (blood work) drawn today and your tests are completely normal, you will receive your results only by: [facility] (if you have MyChart) OR A paper copy in the mail If you have any lab test that is abnormal or we need to change your treatment, we will call you to review the results.   Testing/Procedures: NONE ordered at this time of appointment     Follow-Up: At [facility], you and your health needs are our priority.  As part of our continuing mission to provide you with exceptional heart care, we have created designated Provider Care Teams.  These Care Teams include your primary Cardiologist (physician) and Advanced Practice Providers (APPs -  Physician Assistants and Nurse Practitioners) who all work together to provide you with the care you need, when you need it.  We recommend signing up for the patient portal called "MyChart".  Sign up information is provided on this After Visit Summary.  MyChart is used to connect with patients for Virtual Visits (Telemedicine).  Patients are able to view lab/test results, encounter notes, upcoming appointments, etc.  Non-urgent messages can be sent to your provider as well.   To learn more about what you can do with MyChart, go to [email or web address].    Your next appointment:    Keep Follow up appointments   Provider:   Evalina Field, MD     Other Instructions

## 2022-03-14 NOTE — Progress Notes (Signed)
Office Visit    Patient Name: Melissa Riley Date of Encounter: 03/14/2022  Primary Care Provider:  Joycelyn Man, FNP Primary Cardiologist:  Evalina Field, MD  Chief Complaint    57 year old female with a history of mitral valve regurgitation s/p mitral valve repair in 01/2022, hypertension, hypothyroidism and arthritis who presents for follow-up related to mitral valve regurgitation.  Past Medical History    Past Medical History:  Diagnosis Date   Arthritis    Heart murmur    Hypertension    Hypothyroidism    Mitral regurgitation    Uterine fibroid    Past Surgical History:  Procedure Laterality Date   BREAST BIOPSY Left    " Patient states over 20 years ago"   MITRAL VALVE REPAIR N/A 02/16/2022   Procedure: MITRAL VALVE REPAIR (MVR) USING 34 MM EDWARDS PHYSIO II ANNULOPLASTY RING;  Surgeon: Neomia Glass, MD;  Location: [facility];  Service: Open Heart Surgery;  Laterality: N/A;   RIGHT/LEFT HEART CATH AND CORONARY ANGIOGRAPHY N/A 04/05/2021   Procedure: RIGHT/LEFT HEART CATH AND CORONARY ANGIOGRAPHY;  Surgeon: Jettie Booze, MD;  Location: [facility] CV LAB;  Service: Cardiovascular;  Laterality: N/A;   TEE WITHOUT CARDIOVERSION N/A 02/10/2017   Procedure: TRANSESOPHAGEAL ECHOCARDIOGRAM (TEE);  Surgeon: Adrian Prows, MD;  Location: [facility];  Service: Cardiovascular;  Laterality: N/A;   TEE WITHOUT CARDIOVERSION N/A 04/05/2021   Procedure: TRANSESOPHAGEAL ECHOCARDIOGRAM (TEE);  Surgeon: [name], MD;  Location: [facility] ENDOSCOPY;  Service: Cardiovascular;  Laterality: N/A;   TEE WITHOUT CARDIOVERSION N/A 02/16/2022   Procedure: TRANSESOPHAGEAL ECHOCARDIOGRAM (TEE);  Surgeon: Neomia Glass, MD;  Location: [facility];  Service: Open Heart Surgery;  Laterality: N/A;   TUBAL LIGATION     WISDOM TOOTH EXTRACTION Bilateral 2004    Allergies  Allergies  Allergen Reactions   Amoxil [Amoxicillin] Other (See Comments)    Yeast infection     Labs/Other  Studies Reviewed    The following studies were reviewed today: [facility] 03/2021:   The left ventricular systolic function is normal.   LV end diastolic pressure is mildly elevated.   The left ventricular ejection fraction is 55-65% by visual estimate.   Hemodynamic findings consistent with mild pulmonary hypertension.   There is no aortic valve stenosis.   No angiographically apparent coronary artery disease   Aortic saturation 97%, PA saturation 83%, pulmonary artery pressure 36/18, mean pulmonary artery pressure 27 mmHg, unable to wedge PA catheter despite use of wire.  Cardiac output 9.9 L/min, cardiac index 4.99   No significant coronary artery disease.  Continue with management of severe mitral regurgitation.  TEE 03/2021:  IMPRESSIONS    1. Left ventricular ejection fraction, by estimation, is 60 to 65%. The  left ventricle has normal function.   2. Right ventricular systolic function is normal. The right ventricular  size is normal.   3. Left atrial size was severely dilated. No left atrial/left atrial  appendage thrombus was detected.   4. The aortic valve is tricuspid. Aortic valve regurgitation is not  visualized. No aortic stenosis is present.   5. The mitral valve is abnormal. Flail P1/2. Severe mitral valve  regurgitation. Eccentric anterior directed MR jet. 2D ERO 0.8 cm^2, 3D VCA  0.6 cm^2, severe LA enlargement, systolic flow reversal in RUPV, all  consistent with severe MR.    Recent Labs: 02/14/2022: ALT 16 02/20/2022: Hemoglobin 9.4; Platelets 161 02/22/2022: TSH 7.364 02/24/2022: BUN 13; Creatinine, Ser 0.93; Magnesium 2.2; Potassium  3.8; Sodium 137  Recent Lipid Panel No results found for: "CHOL", "TRIG", "HDL", "CHOLHDL", "VLDL", "LDLCALC", "LDLDIRECT"  History of Present Illness    57 year old female with the above past medical history including mitral valve regurgitation s/p mitral valve repair in 01/2022, hypertension, hypothyroidism, and arthritis.   Prior  TEE in 2019 (images not available for review) mentioned prolapse of the posterior mitral valve leaflet with eccentric regurgitation that was quantified as moderate to severe.  She was referred to Dr. Audie Box in January 2023 for further evaluation of mitral valve regurgitation in the setting of intermittent shortness of breath.  Repeat echocardiogram showed EF 60 to 65%, normal LV function, no RWMA, normal RV systolic function, severely dilated left atrium, mildly dilated right atrium, severe mitral valve regurgitation with severe holosystolic prolapse of the middle scallop of the posterior leaflet of the mitral valve.  TEE in 03/2021 showed EF 60 to 65%, normal LV function, normal RV systolic function, flail 0000000 of the mitral valve with severe mitral valve regurgitation, eccentric anterior directed MR jet.  She underwent [facility] 03/2021 which revealed no evidence of CAD, mild pulmonary hypertension. She was advised to follow-up with [facility] cardiology for consideration of mitral valve surgery.  She was last seen in the office on 12/15/2021 and was stable overall from a cardiac standpoint.  She continued to note mild intermittent dyspnea on exertion.  She was referred to CT surgery and underwent elective mitral valve repair using a 34 mm Edwards physio 2 annuloplasty ring.  Mitral valve commissuroplasty was also performed.  She did have atrial fibrillation on the day of discharge.  Zio patch was placed and she was started on amiodarone and Eliquis. Repeat echocardiogram is scheduled for 03/2022.   She presents today for follow-up. Since her surgery she has done well from a cardiac standpoint.  She denies any palpitations, dyspnea, edema, PND, orthopnea, weight gain.  She is working on increasing her activity.  Overall, she reports feeling well.   Home Medications    Current Outpatient Medications  Medication Sig Dispense Refill   amiodarone (PACERONE) 200 MG tablet Take 2 tablets (400 mg total) by mouth 2 (two)  times daily for 6 days THEN take 1 tablet (200 mg total) twice daily thereafter. 60 tablet 2   amLODipine (NORVASC) 5 MG tablet Take 5 mg by mouth daily.     apixaban (ELIQUIS) 5 MG TABS tablet Take 1 tablet (5 mg total) by mouth 2 (two) times daily. 60 tablet 2   Ascorbic Acid (VITAMIN C PO) Take 1 tablet by mouth daily.     aspirin EC 81 MG tablet Take 81 mg by mouth daily. Swallow whole.     cephALEXin (KEFLEX) 500 MG capsule Take 1 capsule (500 mg total) by mouth 3 (three) times daily for 5 days. 15 capsule 0   Cholecalciferol (VITAMIN D3) 25 MCG (1000 UT) CHEW Chew by mouth daily. 2 gummies daily     ferrous sulfate 324 MG TBEC Take 324 mg by mouth 2 (two) times daily.     hydrochlorothiazide (HYDRODIURIL) 25 MG tablet Take 25 mg by mouth daily.     hydrocortisone cream 1 % Apply topically 3 (three) times daily as needed for itching. 28 g 0   levothyroxine (SYNTHROID) 25 MCG tablet Take 25 mcg by mouth daily before breakfast.     metoprolol succinate (TOPROL XL) 25 MG 24 hr tablet Take 1 tablet (25 mg total) by mouth daily. 30 tablet 5   No current facility-administered  medications for this visit.     Review of Systems   She denies chest pain, palpitations, dyspnea, pnd, orthopnea, n, v, dizziness, syncope, edema, weight gain, or early satiety. All other systems reviewed and are otherwise negative except as noted above.     Cardiac Rehabilitation Eligibility Assessment      Physical Exam    VS:  BP 111/69   Pulse 82   Ht 5' 2.5" (1.588 m)   Wt 221 lb 9.6 oz (100.5 kg)   LMP  (LMP Unknown)   SpO2 97%   BMI 39.89 kg/m  GEN: Well nourished, well developed, in no acute distress. HEENT: normal. Neck: Supple, no JVD, carotid bruits, or masses. Cardiac: RRR, no murmurs, rubs, or gallops. No clubbing, cyanosis, edema.  Radials/DP/PT 2+ and equal bilaterally.  Respiratory:  Respirations regular and unlabored, clear to auscultation bilaterally. GI: Soft, nontender, nondistended, BS  + x 4. MS: no deformity or atrophy. Skin: warm and dry, no rash.  Sternal incision clean, dry, intact and well-approximated. Neuro:  Strength and sensation are intact. Psych: Normal affect.  Accessory Clinical Findings    ECG personally reviewed by me today -sinus rhythm, 82 bpm, possible ectopic atrial rhythm, incomplete bundle branch block- no acute changes.   Lab Results  Component Value Date   WBC 11.0 (H) 02/20/2022   HGB 9.4 (L) 02/20/2022   HCT 28.3 (L) 02/20/2022   MCV 96.6 02/20/2022   PLT 161 02/20/2022   Lab Results  Component Value Date   CREATININE 0.93 02/24/2022   BUN 13 02/24/2022   NA 137 02/24/2022   K 3.8 02/24/2022   CL 103 02/24/2022   CO2 23 02/24/2022   Lab Results  Component Value Date   ALT 16 02/14/2022   AST 24 02/14/2022   ALKPHOS 53 02/14/2022   BILITOT 0.8 02/14/2022   No results found for: "CHOL", "HDL", "LDLCALC", "LDLDIRECT", "TRIG", "CHOLHDL"  Lab Results  Component Value Date   HGBA1C 5.2 02/14/2022    Assessment & Plan   1. Mitral valve regurgitation/shortness of breath: TEE in 03/2021 showed EF 60 to 65%, normal LV function, normal RV systolic function, flail 0000000 of the mitral valve with severe mitral valve regurgitation, eccentric anterior directed MR jet.  She underwent [facility] 03/2021 which revealed no evidence of CAD, mild pulmonary hypertension. Now s/p MV repair in 01/2022.  Since her surgery, her shortness of breath has completely resolved.  Euvolemic and well compensated on exam.  Discussed SBE prophylaxis.  She does have an allergy to amoxicillin (consider cephalexin, azithromycin, clarithromycin, or doxycycline).  She has follow-up with CT surgery on 03/16/2022 for a wound check.  Repeat echo scheduled for 03/30/2022.   2. Paroxysmal atrial fibrillation: Occurred in the postop setting.  She is maintaining sinus rhythm.  Zio patch pending.  For now, continue amiodarone, metoprolol, Eliquis.  She may be able to discontinue amiodarone  and Eliquis in the future, if no recurrence-will defer to Dr. Audie Box.  She did have expected postop anemia, will repeat CBC today.  3. PVCs: Noted on prior EKGs. Denies palpitations.   4.  Hypertension: BP well controlled. Continue current antihypertensive regimen.   5. Disposition: Follow-up as scheduled with Dr. Audie Box in 03/2022.       Lenna Sciara, NP 03/14/2022, 10:32 AM

## 2022-03-14 NOTE — Addendum Note (Signed)
Addended by: Diona Browner C on: 03/14/2022 11:45 AM   Modules accepted: Level of Service

## 2022-03-16 ENCOUNTER — Encounter: Payer: Self-pay | Admitting: Physician Assistant

## 2022-03-16 ENCOUNTER — Telehealth: Payer: Self-pay

## 2022-03-16 ENCOUNTER — Ambulatory Visit (INDEPENDENT_AMBULATORY_CARE_PROVIDER_SITE_OTHER): Payer: Self-pay | Admitting: Physician Assistant

## 2022-03-16 VITALS — BP 131/80 | HR 81 | Resp 20 | Ht 62.0 in | Wt 224.0 lb

## 2022-03-16 DIAGNOSIS — I340 Nonrheumatic mitral (valve) insufficiency: Secondary | ICD-10-CM

## 2022-03-16 DIAGNOSIS — Z9889 Other specified postprocedural states: Secondary | ICD-10-CM

## 2022-03-16 NOTE — Patient Instructions (Addendum)
You are encouraged to enroll and participate in the outpatient cardiac rehab program beginning as soon as practical. 2. Continue to avoid any heavy lifting or strenuous use of your arms or shoulders for at least a total of three months from the time of surgery.  After three months you may gradually increase how much you lift or otherwise use your arms or chest as tolerated, with limits based upon whether or not activities lead to the return of significant discomfort. 3. Endocarditis is a potentially serious infection of heart valves or inside lining of the heart.  It occurs more commonly in patients with diseased heart valves (such as patient's with aortic or mitral valve disease) and in patients who have undergone heart valve repair or replacement.  Certain surgical and dental procedures may put you at risk, such as dental cleaning, other dental procedures, or any surgery involving the respiratory, urinary, gastrointestinal tract, gallbladder or prostate gland.   To minimize your chances for develooping endocarditis, maintain good oral health and seek prompt medical attention for any infections involving the mouth, teeth, gums, skin or urinary tract.    Always notify your doctor or dentist about your underlying heart valve condition before having any invasive procedures. You will need to take antibiotics before certain procedures, including all routine dental cleanings or other dental procedures.  Your cardiologist or dentist should prescribe these antibiotics for you to be taken ahead of time.

## 2022-03-16 NOTE — Telephone Encounter (Signed)
Spoke with pt. Pt was notified of lab results. Pt will continue current medication and follow up as planned.

## 2022-03-16 NOTE — Progress Notes (Addendum)
     [address]       [place] [address]             [phone]  HPI: This is a 57 year old female with a past medical history of hypertension, hypothyroidism, and arthritis who was found to have severe mitral regurgitation and underwent a mitral valve repair (using a Physio II ring, size 34 mm) by Dr. Tenny Craw on 02/16/2022. She was seen by Dr. Tenny Craw on 03/10/2022 and the lower portion of the sternal wound had drainage so he prescribed Keflex 500 mg tid. She has finished Keflex. She presents today for wound check. Of note, she denies further drainage, fever, or chills. Of note, she has not had any bleeding from fibroids over last several days.  Current Outpatient Medications  Medication Sig Dispense Refill   amiodarone (PACERONE) 200 MG tablet Take 2 tablets (400 mg total) by mouth 2 (two) times daily for 6 days THEN take 1 tablet (200 mg total) twice daily thereafter. 60 tablet 2   amLODipine (NORVASC) 5 MG tablet Take 5 mg by mouth daily.     apixaban (ELIQUIS) 5 MG TABS tablet Take 1 tablet (5 mg total) by mouth 2 (two) times daily. 60 tablet 2   Ascorbic Acid (VITAMIN C PO) Take 1 tablet by mouth daily.     aspirin EC 81 MG tablet Take 81 mg by mouth daily. Swallow whole.     Cholecalciferol (VITAMIN D3) 25 MCG (1000 UT) CHEW Chew by mouth daily. 2 gummies daily     ferrous sulfate 324 MG TBEC Take 324 mg by mouth 2 (two) times daily.     hydrochlorothiazide (HYDRODIURIL) 25 MG tablet Take 25 mg by mouth daily.     hydrocortisone cream 1 % Apply topically 3 (three) times daily as needed for itching. 28 g 0   levothyroxine (SYNTHROID) 25 MCG tablet Take 25 mcg by mouth daily before breakfast.     metoprolol succinate (TOPROL XL) 25 MG 24 hr tablet Take 1 tablet (25 mg total) by mouth daily. 30 tablet 5  Vital Signs: Vitals:   03/16/22 1214  BP: 131/80  Pulse: 81  Resp: 20  SpO2: 94%    Physical Exam: CV-RRR Pulmonary-Clear to auscultation  bilaterally Extremities-No LE edema Wound-Sternal wound is clean, dry, no drainage. Eschars where retention sutures were placed.  Impression and Plan: Patient has already seen [facility] NP in follow up on 03/14/2022. EKG done then showed SR, possible ectopic atrial rhythm, and incomplete bundle branch block. No adjustments were made to her medication. She is already driving as has not needed any narcotic for pain in quite sometime. She wishes to participate in cardiac rehab so we will refer her. We discussed sternal precautions are to continue for 4 more weeks and discussed endocarditis prophylaxis. She will not restart any estrogen type medication for at least 3 months from surgery. She has a follow up echo on 03/06 and a follow up to see Dr. Audie Box on 03/15. Patient will return to work in March. Per patient request, she will contact office if there is a problem or sternal wound develops redness or drainage.    Nani Skillern, PA-C [facility] [phone]

## 2022-03-16 NOTE — Addendum Note (Signed)
Encounter addended by: Gerarda Gunther on: 03/16/2022 5:46 PM  Actions taken: Imaging Exam ended

## 2022-03-21 ENCOUNTER — Other Ambulatory Visit (HOSPITAL_COMMUNITY): Payer: Self-pay

## 2022-03-21 ENCOUNTER — Other Ambulatory Visit: Payer: Self-pay

## 2022-03-24 ENCOUNTER — Encounter (HOSPITAL_COMMUNITY): Payer: Self-pay

## 2022-03-24 ENCOUNTER — Telehealth (HOSPITAL_COMMUNITY): Payer: Self-pay

## 2022-03-24 NOTE — Telephone Encounter (Signed)
Pt returned CR phone call and stated she is interested in CR. Patient will come in for orientation on 04/06/2022 @ 8AM and will attend the 6:45AM exercise class.   Tourist information centre manager.

## 2022-03-24 NOTE — Telephone Encounter (Signed)
Attempted to call patient in regards to Cardiac Rehab - LM on VM Mailed letter 

## 2022-03-30 ENCOUNTER — Ambulatory Visit (HOSPITAL_COMMUNITY): Payer: Commercial Managed Care - HMO | Attending: Internal Medicine

## 2022-03-30 DIAGNOSIS — I10 Essential (primary) hypertension: Secondary | ICD-10-CM | POA: Diagnosis not present

## 2022-03-30 DIAGNOSIS — Z9889 Other specified postprocedural states: Secondary | ICD-10-CM | POA: Insufficient documentation

## 2022-03-30 DIAGNOSIS — E039 Hypothyroidism, unspecified: Secondary | ICD-10-CM | POA: Insufficient documentation

## 2022-03-30 DIAGNOSIS — I088 Other rheumatic multiple valve diseases: Secondary | ICD-10-CM | POA: Diagnosis not present

## 2022-03-30 DIAGNOSIS — I517 Cardiomegaly: Secondary | ICD-10-CM | POA: Diagnosis not present

## 2022-03-31 LAB — ECHOCARDIOGRAM COMPLETE
Area-P 1/2: 2.08 cm2
MV M vel: 5.63 m/s
MV Peak grad: 126.8 mmHg
MV VTI: 1.77 cm2
S' Lateral: 3.3 cm

## 2022-04-04 ENCOUNTER — Telehealth (HOSPITAL_COMMUNITY): Payer: Self-pay

## 2022-04-04 NOTE — Telephone Encounter (Signed)
Called pt to confirm appt for 3/13 at 800. Reviewed H/Hx, where to find, appt details. All questions from pt answered.   [name], MS 04/04/2022 4:44 PM

## 2022-04-06 ENCOUNTER — Encounter (HOSPITAL_COMMUNITY)
Admission: RE | Admit: 2022-04-06 | Discharge: 2022-04-06 | Disposition: A | Discharge status: Home / Self Care | Payer: Commercial Managed Care - HMO | Source: Ambulatory Visit | Attending: Cardiovascular Disease | Admitting: Cardiovascular Disease

## 2022-04-06 VITALS — BP 136/82 | HR 70 | Ht 62.0 in | Wt 225.0 lb

## 2022-04-06 DIAGNOSIS — Z9889 Other specified postprocedural states: Secondary | ICD-10-CM

## 2022-04-06 DIAGNOSIS — R931 Abnormal findings on diagnostic imaging of heart and coronary circulation: Secondary | ICD-10-CM | POA: Insufficient documentation

## 2022-04-06 NOTE — Progress Notes (Signed)
Cardiac Individual Treatment Plan  Patient Details  Name: Melissa Riley MRN: [id number] Date of Birth: 1965/12/25 Referring Provider:   Flowsheet Row CARDIAC REHAB PHASE II ORIENTATION from 04/06/2022 in [facility], & [place]  Referring Provider Eleonore Chiquito, MD       Initial Encounter Date:  [place] from 04/06/2022 in [facility]  Date 04/06/22       Visit Diagnosis: S/P mitral valve repair  Patient's Home Medications on Admission:  Current Outpatient Medications:    amiodarone (PACERONE) 200 MG tablet, Take 2 tablets (400 mg total) by mouth 2 (two) times daily for 6 days THEN take 1 tablet (200 mg total) twice daily thereafter., Disp: 60 tablet, Rfl: 2   amLODipine (NORVASC) 5 MG tablet, Take 5 mg by mouth daily., Disp: , Rfl:    apixaban (ELIQUIS) 5 MG TABS tablet, Take 1 tablet (5 mg total) by mouth 2 (two) times daily., Disp: 60 tablet, Rfl: 2   Ascorbic Acid (VITAMIN C PO), Take 1 tablet by mouth daily., Disp: , Rfl:    aspirin EC 81 MG tablet, Take 81 mg by mouth daily. Swallow whole., Disp: , Rfl:    Cholecalciferol (VITAMIN D3) 25 MCG (1000 UT) CHEW, Chew by mouth daily. 2 gummies daily, Disp: , Rfl:    ferrous sulfate 324 MG TBEC, Take 324 mg by mouth 2 (two) times daily., Disp: , Rfl:    hydrochlorothiazide (HYDRODIURIL) 25 MG tablet, Take 25 mg by mouth daily., Disp: , Rfl:    hydrocortisone cream 1 %, Apply topically 3 (three) times daily as needed for itching., Disp: 28 g, Rfl: 0   levothyroxine (SYNTHROID) 25 MCG tablet, Take 25 mcg by mouth daily before breakfast., Disp: , Rfl:    metoprolol succinate (TOPROL XL) 25 MG 24 hr tablet, Take 1 tablet (25 mg total) by mouth daily., Disp: 30 tablet, Rfl: 5  Past Medical History: Past Medical History:  Diagnosis Date   Arthritis    Heart murmur    Hypertension     Hypothyroidism    Mitral regurgitation    Uterine fibroid     Tobacco Use: Social History   Tobacco Use  Smoking Status Never  Smokeless Tobacco Never    Labs: Review Flowsheet       Latest Ref Rng & Units 04/05/2021 02/14/2022 02/16/2022  Labs for ITP Cardiac and Pulmonary Rehab  Hemoglobin A1c 4.8 - 5.6 % - 5.2  -  PH, Arterial 7.35 - 7.45 7.414  7.5  7.398  7.367  7.270  7.383  7.356  7.367  7.417  7.386   PCO2 arterial 32 - 48 mmHg 30.0  34  28.6  29.5  42.2  32.9  39.6  39.5  34.1  34.1   Bicarbonate 20.0 - 28.0 mmol/L 20.8  21.0  19.2  26.5  17.6  17.0  19.8  19.6  22.2  22.7  41.1  21.9  20.5   TCO2 22 - 32 mmol/L '22  22  20  '$ - '19  18  21  21  22  24  23  24  24  27  '$ 43  '23  24  21  24   '$ Acid-base deficit 0.0 - 2.0 mmol/L 3.0  3.0  4.0  - 6.0  7.0  7.0  5.0  3.0  2.0  2.0  4.0   O2  Saturation % 82  83  97  100  98  99  98  100  100  100  84  100  100     Capillary Blood Glucose: Lab Results  Component Value Date   GLUCAP 81 02/25/2022   GLUCAP 98 02/25/2022   GLUCAP 110 (H) 02/24/2022   GLUCAP 80 02/24/2022   GLUCAP 91 02/24/2022     Exercise Target Goals: Exercise Program Goal: Individual exercise prescription set using results from initial 6 min walk test and THRR while considering  patient's activity barriers and safety.   Exercise Prescription Goal: Initial exercise prescription builds to 30-45 minutes a day of aerobic activity, 2-3 days per week.  Home exercise guidelines will be given to patient during program as part of exercise prescription that the participant will acknowledge.  Activity Barriers & Risk Stratification:  Activity Barriers & Cardiac Risk Stratification - 04/06/22 1049       Activity Barriers & Cardiac Risk Stratification   Activity Barriers Arthritis;Deconditioning;Incisional Pain;Balance Concerns;History of Falls;Other (comment)    Comments sternal precautions    Cardiac Risk Stratification High   <5 METS on 6MWT            6  Minute Walk:  6 Minute Walk     Row Name 04/06/22 1155         6 Minute Walk   Phase Initial     Distance 1021 feet     Walk Time 6 minutes     # of Rest Breaks 0     MPH 1.9     METS 2.56     RPE 11     Perceived Dyspnea  0     VO2 Peak 8.94     Symptoms No     Resting HR 70 bpm     Resting BP 136/82     Resting Oxygen Saturation  98 %     Exercise Oxygen Saturation  during 6 min walk 98 %     Max Ex. HR 103 bpm     Max Ex. BP 140/86     2 Minute Post BP 126/80              Oxygen Initial Assessment:   Oxygen Re-Evaluation:   Oxygen Discharge (Final Oxygen Re-Evaluation):   Initial Exercise Prescription:  Initial Exercise Prescription - 04/06/22 1000       Date of Initial Exercise RX and Referring Provider   Date 04/06/22    Referring Provider Eleonore Chiquito, MD    Expected Discharge Date 06/03/22      NuStep   Level 1    SPM 60    Minutes 15    METs 2      Track   Laps 10   big track   Minutes 15    METs 2      Prescription Details   Frequency (times per week) 3    Duration Progress to 30 minutes of continuous aerobic without signs/symptoms of physical distress      Intensity   THRR 40-80% of Max Heartrate 66-132    Ratings of Perceived Exertion 11-13    Perceived Dyspnea 0-4      Progression   Progression Continue progressive overload as per policy without signs/symptoms or physical distress.      Resistance Training   Training Prescription Yes    Weight 2    Reps 10-15             Perform  Capillary Blood Glucose checks as needed.  Exercise Prescription Changes:   Exercise Comments:   Exercise Goals and Review:   Exercise Goals     Row Name 04/06/22 1050             Exercise Goals   Increase Physical Activity Yes       Intervention Provide advice, education, support and counseling about physical activity/exercise needs.;Develop an individualized exercise prescription for aerobic and resistive training based on  initial evaluation findings, risk stratification, comorbidities and participant's personal goals.       Expected Outcomes Short Term: Attend rehab on a regular basis to increase amount of physical activity.;Long Term: Exercising regularly at least 3-5 days a week.;Long Term: Add in home exercise to make exercise part of routine and to increase amount of physical activity.       Increase Strength and Stamina Yes       Intervention Provide advice, education, support and counseling about physical activity/exercise needs.;Develop an individualized exercise prescription for aerobic and resistive training based on initial evaluation findings, risk stratification, comorbidities and participant's personal goals.       Expected Outcomes Short Term: Increase workloads from initial exercise prescription for resistance, speed, and METs.;Short Term: Perform resistance training exercises routinely during rehab and add in resistance training at home;Long Term: Improve cardiorespiratory fitness, muscular endurance and strength as measured by increased METs and functional capacity (6MWT)       Able to understand and use rate of perceived exertion (RPE) scale Yes       Intervention Provide education and explanation on how to use RPE scale       Expected Outcomes Short Term: Able to use RPE daily in rehab to express subjective intensity level;Long Term:  Able to use RPE to guide intensity level when exercising independently       Knowledge and understanding of Target Heart Rate Range (THRR) Yes       Intervention Provide education and explanation of THRR including how the numbers were predicted and where they are located for reference       Expected Outcomes Short Term: Able to state/look up THRR;Long Term: Able to use THRR to govern intensity when exercising independently;Short Term: Able to use daily as guideline for intensity in rehab       Understanding of Exercise Prescription Yes       Intervention Provide education,  explanation, and written materials on patient's individual exercise prescription       Expected Outcomes Short Term: Able to explain program exercise prescription;Long Term: Able to explain home exercise prescription to exercise independently                Exercise Goals Re-Evaluation :   Discharge Exercise Prescription (Final Exercise Prescription Changes):   Nutrition:  Target Goals: Understanding of nutrition guidelines, daily intake of sodium '1500mg'$ , cholesterol '200mg'$ , calories 30% from fat and 7% or less from saturated fats, daily to have 5 or more servings of fruits and vegetables.  Biometrics:  Pre Biometrics - 04/06/22 0831       Pre Biometrics   Waist Circumference 43 inches    Hip Circumference 49.5 inches    Waist to Hip Ratio 0.87 %    Triceps Skinfold 45 mm    % Body Fat 39.4 %    Grip Strength 24 kg    Flexibility 16 in    Single Leg Stand 3 seconds  Nutrition Therapy Plan and Nutrition Goals:   Nutrition Assessments:  MEDIFICTS Score Key: ?70 Need to make dietary changes  40-70 Heart Healthy Diet ? 40 Therapeutic Level Cholesterol Diet    Picture Your Plate Scores: [id number] Unhealthy dietary pattern with much room for improvement. 41-50 Dietary pattern unlikely to meet recommendations for good health and room for improvement. 51-60 More healthful dietary pattern, with some room for improvement.  >60 Healthy dietary pattern, although there may be some specific behaviors that could be improved.    Nutrition Goals Re-Evaluation:   Nutrition Goals Re-Evaluation:   Nutrition Goals Discharge (Final Nutrition Goals Re-Evaluation):   Psychosocial: Target Goals: Acknowledge presence or absence of significant depression and/or stress, maximize coping skills, provide positive support system. Participant is able to verbalize types and ability to use techniques and skills needed for reducing stress and depression.  Initial Review &  Psychosocial Screening:  Initial Psych Review & Screening - 04/06/22 1051       Initial Review   Current issues with None Identified      Family Dynamics   Good Support System? Yes   Mickell has her mother, sister, and children for support     Barriers   Psychosocial barriers to participate in program There are no identifiable barriers or psychosocial needs.      Screening Interventions   Interventions Encouraged to exercise;Provide feedback about the scores to participant    Expected Outcomes Short Term goal: Identification and review with participant of any Quality of Life or Depression concerns found by scoring the questionnaire.;Long Term goal: The participant improves quality of Life and PHQ9 Scores as seen by post scores and/or verbalization of changes             Quality of Life Scores:  Quality of Life - 04/06/22 1155       Quality of Life   Select Quality of Life      Quality of Life Scores   Health/Function Pre 17.97 %    Socioeconomic Pre 16.56 %    Psych/Spiritual Pre 17.79 %    Family Pre 20.3 %    GLOBAL Pre 17.94 %            Scores of 19 and below usually indicate a poorer quality of life in these areas.  A difference of  2-3 points is a clinically meaningful difference.  A difference of 2-3 points in the total score of the Quality of Life Index has been associated with significant improvement in overall quality of life, self-image, physical symptoms, and general health in studies assessing change in quality of life.  PHQ-9: Review Flowsheet       04/06/2022 02/10/2022 02/04/2021  Depression screen PHQ 2/9  Decreased Interest 0 0 0  Down, Depressed, Hopeless 0 0 1  PHQ - 2 Score 0 0 1  Altered sleeping 0 0 1  Tired, decreased energy 0 0 1  Change in appetite 0 0 0  Feeling bad or failure about yourself  0 0 0  Trouble concentrating 0 0 0  Moving slowly or fidgety/restless 0 0 0  Suicidal thoughts 0 0 0  PHQ-9 Score 0 0 3  Difficult doing  work/chores - - Not difficult at all   Interpretation of Total Score  Total Score Depression Severity:  1-4 = Minimal depression, 5-9 = Mild depression, 10-14 = Moderate depression, 15-19 = Moderately severe depression, 20-27 = Severe depression   Psychosocial Evaluation and Intervention:   Psychosocial Re-Evaluation:  Psychosocial Discharge (Final Psychosocial Re-Evaluation):   Vocational Rehabilitation: Provide vocational rehab assistance to qualifying candidates.   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 Participant will state understanding/return demonstration of topics presented.     Core Videos: Exercise    Move It!  Clinical staff conducted group or individual video education with verbal and written material and guidebook.  Patient learns the recommended Pritikin exercise program. Exercise with the goal of living a long, healthy life. Some of the health benefits of exercise include controlled diabetes, healthier blood pressure levels, improved cholesterol levels, improved heart and lung capacity, improved sleep, and better body composition. Everyone should speak with their doctor before starting or changing an exercise routine.  Biomechanical Limitations Clinical staff conducted group or individual video education with verbal and written material and guidebook.  Patient learns how biomechanical limitations can impact exercise and how we can mitigate and possibly overcome  limitations to have an impactful and balanced exercise routine.  Body Composition Clinical staff conducted group or individual video education with verbal and written material and guidebook.  Patient learns that body composition (ratio of muscle mass to fat mass) is a key component to assessing overall fitness, rather than body weight alone. Increased fat mass, especially visceral belly fat, can put [place] at increased risk for metabolic syndrome, type 2 diabetes, heart disease, and even death. It is recommended to combine diet and exercise (cardiovascular and resistance training) to improve your body composition. Seek guidance from your physician and exercise physiologist before implementing an exercise routine.  Exercise Action Plan Clinical staff conducted group or individual video education with verbal and written material and guidebook.  Patient learns the recommended strategies to achieve and enjoy long-term exercise adherence, including variety, self-motivation, self-efficacy, and positive decision making. Benefits of exercise include fitness, good health, weight management, more energy, better sleep, less stress, and overall well-being.  Medical   Heart Disease Risk Reduction Clinical staff conducted group or individual video education with verbal and written material and guidebook.  Patient learns our heart is our most vital organ as it circulates oxygen, nutrients, white blood cells, and hormones throughout the entire body, and carries waste away. Data supports a plant-based eating plan like the Pritikin Program for its effectiveness in slowing progression of and reversing heart disease. The video provides a number of recommendations to address heart disease.   Metabolic Syndrome and Belly Fat  Clinical staff conducted group or individual video education with verbal and written material and guidebook.  Patient learns what metabolic syndrome is, how it leads to heart disease, and how one can  reverse it and keep it from coming back. You have metabolic syndrome if you have 3 of the following 5 criteria: abdominal obesity, high blood pressure, high triglycerides, low HDL cholesterol, and high blood sugar.  Hypertension and Heart Disease Clinical staff conducted group or individual video education with verbal and written material and guidebook.  Patient learns that high blood pressure, or hypertension, is  very common in the [place]. Hypertension is largely due to excessive salt intake, but other important risk factors include being overweight, physical inactivity, drinking too much alcohol, smoking, and not eating enough potassium from fruits and vegetables. High blood pressure is a leading risk factor for heart attack, stroke, congestive heart failure, dementia, kidney failure, and premature death. Long-term effects of excessive salt intake include stiffening of the arteries and thickening of heart muscle and organ damage. Recommendations include ways to reduce hypertension and the risk of heart disease.  Diseases of Our Time - Focusing on Diabetes Clinical staff conducted group or individual video education with verbal and written material and guidebook.  Patient learns why the best way to stop diseases of our time is prevention, through food and other lifestyle changes. Medicine (such as prescription pills and surgeries) is often only a Band-Aid on the problem, not a long-term solution. Most common diseases of our time include obesity, type 2 diabetes, hypertension, heart disease, and cancer. The Pritikin Program is recommended and has been proven to help reduce, reverse, and/or prevent the damaging effects of metabolic syndrome.  Nutrition   Overview of the Pritikin Eating Plan  Clinical staff conducted group or individual video education with verbal and written material and guidebook.  Patient learns about the [place] for disease risk reduction. The [place]  emphasizes a wide variety of unrefined, minimally-processed carbohydrates, like fruits, vegetables, whole grains, and legumes. Go, Caution, and Stop food choices are explained. Plant-based and lean animal proteins are emphasized. Rationale provided for low sodium intake for blood pressure control, low added sugars for blood sugar stabilization, and low added fats and oils for coronary artery disease risk reduction and weight management.  Calorie Density  Clinical staff conducted group or individual video education with verbal and written material and guidebook.  Patient learns about calorie density and how it impacts the Pritikin Eating Plan. Knowing the characteristics of the food you choose will help you decide whether those foods will lead to weight gain or weight loss, and whether you want to consume more or less of them. Weight loss is usually a side effect of the Pritikin Eating Plan because of its focus on low calorie-dense foods.  Label Reading  Clinical staff conducted group or individual video education with verbal and written material and guidebook.  Patient learns about the Pritikin recommended label reading guidelines and corresponding recommendations regarding calorie density, added sugars, sodium content, and whole grains.  Dining Out - Part 1  Clinical staff conducted group or individual video education with verbal and written material and guidebook.  Patient learns that restaurant meals can be sabotaging because they can be so high in calories, fat, sodium, and/or sugar. Patient learns recommended strategies on how to positively address this and avoid unhealthy pitfalls.  Facts on Fats  Clinical staff conducted group or individual video education with verbal and written material and guidebook.  Patient learns that lifestyle modifications can be just as effective, if not more so, as many medications for lowering your risk of heart disease. A Pritikin lifestyle can help to reduce your  risk of inflammation and atherosclerosis (cholesterol build-up, or plaque, in the artery walls). Lifestyle interventions such as dietary choices and physical activity address the cause of atherosclerosis. A review of the types of fats and their impact on blood cholesterol levels, along with dietary recommendations to reduce fat intake is also included.  Nutrition Action Plan  Clinical staff conducted group or individual video  education with verbal and written material and guidebook.  Patient learns how to incorporate Pritikin recommendations into their lifestyle. Recommendations include planning and keeping personal health goals in mind as an important part of their success.  Healthy Mind-Set    Healthy Minds, Bodies, Hearts  Clinical staff conducted group or individual video education with verbal and written material and guidebook.  Patient learns how to identify when they are stressed. Video will discuss the impact of that stress, as well as the many benefits of stress management. Patient will also be introduced to stress management techniques. The way we think, act, and feel has an impact on our hearts.  How Our Thoughts Can Heal Our Hearts  Clinical staff conducted group or individual video education with verbal and written material and guidebook.  Patient learns that negative thoughts can cause depression and anxiety. This can result in negative lifestyle behavior and serious health problems. Cognitive behavioral therapy is an effective method to help control our thoughts in order to change and improve our emotional outlook.  Additional Videos:  Exercise    Improving Performance  Clinical staff conducted group or individual video education with verbal and written material and guidebook.  Patient learns to use a non-linear approach by alternating intensity levels and lengths of time spent exercising to help burn more calories and lose more body fat. Cardiovascular exercise helps improve heart  health, metabolism, hormonal balance, blood sugar control, and recovery from fatigue. Resistance training improves strength, endurance, balance, coordination, reaction time, metabolism, and muscle mass. Flexibility exercise improves circulation, posture, and balance. Seek guidance from your physician and exercise physiologist before implementing an exercise routine and learn your capabilities and proper form for all exercise.  Introduction to Yoga  Clinical staff conducted group or individual video education with verbal and written material and guidebook.  Patient learns about yoga, a discipline of the coming together of mind, breath, and body. The benefits of yoga include improved flexibility, improved range of motion, better posture and core strength, increased lung function, weight loss, and positive self-image. Yoga's heart health benefits include lowered blood pressure, healthier heart rate, decreased cholesterol and triglyceride levels, improved immune function, and reduced stress. Seek guidance from your physician and exercise physiologist before implementing an exercise routine and learn your capabilities and proper form for all exercise.  Medical   Aging: Enhancing Your Quality of Life  Clinical staff conducted group or individual video education with verbal and written material and guidebook.  Patient learns key strategies and recommendations to stay in good physical health and enhance quality of life, such as prevention strategies, having an advocate, securing a [place], and keeping a list of medications and system for tracking them. It also discusses how to avoid risk for bone loss.  Biology of Weight Control  Clinical staff conducted group or individual video education with verbal and written material and guidebook.  Patient learns that weight gain occurs because we consume more calories than we burn (eating more, moving less). Even if your body weight is  normal, you may have higher ratios of fat compared to muscle mass. Too much body fat puts you at increased risk for cardiovascular disease, heart attack, stroke, type 2 diabetes, and obesity-related cancers. In addition to exercise, following the [place] can help reduce your risk.  Decoding Lab Results  Clinical staff conducted group or individual video education with verbal and written material and guidebook.  Patient learns that lab test reflects one  measurement whose values change over time and are influenced by many factors, including medication, stress, sleep, exercise, food, hydration, pre-existing medical conditions, and more. It is recommended to use the knowledge from this video to become more involved with your lab results and evaluate your numbers to speak with your doctor.   Diseases of Our Time - Overview  Clinical staff conducted group or individual video education with verbal and written material and guidebook.  Patient learns that according to the CDC, 50% to 70% of chronic diseases (such as obesity, type 2 diabetes, elevated lipids, hypertension, and heart disease) are avoidable through lifestyle improvements including healthier food choices, listening to satiety cues, and increased physical activity.  Sleep Disorders Clinical staff conducted group or individual video education with verbal and written material and guidebook.  Patient learns how good quality and duration of sleep are important to overall health and well-being. Patient also learns about sleep disorders and how they impact health along with recommendations to address them, including discussing with a physician.  Nutrition  Dining Out - Part 2 Clinical staff conducted group or individual video education with verbal and written material and guidebook.  Patient learns how to plan ahead and communicate in order to maximize their dining experience in a healthy and nutritious manner. Included are recommended  food choices based on the type of restaurant the patient is visiting.   Fueling a Best boy conducted group or individual video education with verbal and written material and guidebook.  There is a strong connection between our food choices and our health. Diseases like obesity and type 2 diabetes are very prevalent and are in large-part due to lifestyle choices. The Pritikin Eating Plan provides plenty of food and hunger-curbing satisfaction. It is easy to follow, affordable, and helps reduce health risks.  Menu Workshop  Clinical staff conducted group or individual video education with verbal and written material and guidebook.  Patient learns that restaurant meals can sabotage health goals because they are often packed with calories, fat, sodium, and sugar. Recommendations include strategies to plan ahead and to communicate with the manager, chef, or server to help order a healthier meal.  Planning Your Eating Strategy  Clinical staff conducted group or individual video education with verbal and written material and guidebook.  Patient learns about the [place] and its benefit of reducing the risk of disease. The [place] does not focus on calories. Instead, it emphasizes high-quality, nutrient-rich foods. By knowing the characteristics of the foods, we choose, we can determine their calorie density and make informed decisions.  Targeting Your Nutrition Priorities  Clinical staff conducted group or individual video education with verbal and written material and guidebook.  Patient learns that lifestyle habits have a tremendous impact on disease risk and progression. This video provides eating and physical activity recommendations based on your personal health goals, such as reducing LDL cholesterol, losing weight, preventing or controlling type 2 diabetes, and reducing high blood pressure.  Vitamins and Minerals  Clinical staff conducted group or  individual video education with verbal and written material and guidebook.  Patient learns different ways to obtain key vitamins and minerals, including through a recommended healthy diet. It is important to discuss all supplements you take with your doctor.   Healthy Mind-Set    Smoking Cessation  Clinical staff conducted group or individual video education with verbal and written material and guidebook.  Patient learns that cigarette smoking and tobacco addiction pose a serious health  risk which affects millions of people. Stopping smoking will significantly reduce the risk of heart disease, lung disease, and many forms of cancer. Recommended strategies for quitting are covered, including working with your doctor to develop a successful plan.  Culinary   Becoming a Financial trader conducted group or individual video education with verbal and written material and guidebook.  Patient learns that cooking at home can be healthy, cost-effective, quick, and puts them in control. Keys to cooking healthy recipes will include looking at your recipe, assessing your equipment needs, planning ahead, making it simple, choosing cost-effective seasonal ingredients, and limiting the use of added fats, salts, and sugars.  Cooking - Breakfast and Snacks  Clinical staff conducted group or individual video education with verbal and written material and guidebook.  Patient learns how important breakfast is to satiety and nutrition through the entire day. Recommendations include key foods to eat during breakfast to help stabilize blood sugar levels and to prevent overeating at meals later in the day. Planning ahead is also a key component.  Cooking - Human resources officer conducted group or individual video education with verbal and written material and guidebook.  Patient learns eating strategies to improve overall health, including an approach to cook more at home. Recommendations include  thinking of animal protein as a side on your plate rather than center stage and focusing instead on lower calorie dense options like vegetables, fruits, whole grains, and plant-based proteins, such as beans. Making sauces in large quantities to freeze for later and leaving the skin on your vegetables are also recommended to maximize your experience.  Cooking - Healthy Salads and Dressing Clinical staff conducted group or individual video education with verbal and written material and guidebook.  Patient learns that vegetables, fruits, whole grains, and legumes are the foundations of the [place]. Recommendations include how to incorporate each of these in flavorful and healthy salads, and how to create homemade salad dressings. Proper handling of ingredients is also covered. Cooking - Soups and [employer] - Soups and Desserts Clinical staff conducted group or individual video education with verbal and written material and guidebook.  Patient learns that Pritikin soups and desserts make for easy, nutritious, and delicious snacks and meal components that are low in sodium, fat, sugar, and calorie density, while high in vitamins, minerals, and filling fiber. Recommendations include simple and healthy ideas for soups and desserts.   Overview     The Pritikin Solution Program Overview Clinical staff conducted group or individual video education with verbal and written material and guidebook.  Patient learns that the results of the [place] Program have been documented in more than 100 articles published in peer-reviewed journals, and the benefits include reducing risk factors for (and, in some cases, even reversing) high cholesterol, high blood pressure, type 2 diabetes, obesity, and more! An overview of the three key pillars of the Pritikin Program will be covered: eating well, doing regular exercise, and having a healthy mind-set.  WORKSHOPS  Exercise: Exercise Basics: Building Your  Action Plan Clinical staff led group instruction and group discussion with PowerPoint presentation and patient guidebook. To enhance the learning environment the use of posters, models and videos may be added. At the conclusion of this workshop, patients will comprehend the difference between physical activity and exercise, as well as the benefits of incorporating both, into their routine. Patients will understand the FITT (Frequency, Intensity, Time, and Type) principle and how to use it  to build an exercise action plan. In addition, safety concerns and other considerations for exercise and cardiac rehab will be addressed by the presenter. The purpose of this lesson is to promote a comprehensive and effective weekly exercise routine in order to improve patients' overall level of fitness.   Managing Heart Disease: Your Path to a Healthier Heart Clinical staff led group instruction and group discussion with PowerPoint presentation and patient guidebook. To enhance the learning environment the use of posters, models and videos may be added.At the conclusion of this workshop, patients will understand the anatomy and physiology of the heart. Additionally, they will understand how Pritikin's three pillars impact the risk factors, the progression, and the management of heart disease.  The purpose of this lesson is to provide a high-level overview of the heart, heart disease, and how the Pritikin lifestyle positively impacts risk factors.  Exercise Biomechanics Clinical staff led group instruction and group discussion with PowerPoint presentation and patient guidebook. To enhance the learning environment the use of posters, models and videos may be added. Patients will learn how the structural parts of their bodies function and how these functions impact their daily activities, movement, and exercise. Patients will learn how to promote a neutral spine, learn how to manage pain, and identify ways to  improve their physical movement in order to promote healthy living. The purpose of this lesson is to expose patients to common physical limitations that impact physical activity. Participants will learn practical ways to adapt and manage aches and pains, and to minimize their effect on regular exercise. Patients will learn how to maintain good posture while sitting, walking, and lifting.  Balance Training and Fall Prevention  Clinical staff led group instruction and group discussion with PowerPoint presentation and patient guidebook. To enhance the learning environment the use of posters, models and videos may be added. At the conclusion of this workshop, patients will understand the importance of their sensorimotor skills (vision, proprioception, and the vestibular system) in maintaining their ability to balance as they age. Patients will apply a variety of balancing exercises that are appropriate for their current level of function. Patients will understand the common causes for poor balance, possible solutions to these problems, and ways to modify their physical environment in order to minimize their fall risk. The purpose of this lesson is to teach patients about the importance of maintaining balance as they age and ways to minimize their risk of falling.  WORKSHOPS   Nutrition:  Fueling a Scientist, research (physical sciences) led group instruction and group discussion with PowerPoint presentation and patient guidebook. To enhance the learning environment the use of posters, models and videos may be added. Patients will review the foundational principles of the [place] and understand what constitutes a serving size in each of the food groups. Patients will also learn Pritikin-friendly foods that are better choices when away from home and review make-ahead meal and snack options. Calorie density will be reviewed and applied to three nutrition priorities: weight maintenance, weight loss, and  weight gain. The purpose of this lesson is to reinforce (in a group setting) the key concepts around what patients are recommended to eat and how to apply these guidelines when away from home by planning and selecting Pritikin-friendly options. Patients will understand how calorie density may be adjusted for different weight management goals.  Mindful Eating  Clinical staff led group instruction and group discussion with PowerPoint presentation and patient guidebook. To enhance the learning environment the use of  posters, models and videos may be added. Patients will briefly review the concepts of the [place] and the importance of low-calorie dense foods. The concept of mindful eating will be introduced as well as the importance of paying attention to internal hunger signals. Triggers for non-hunger eating and techniques for dealing with triggers will be explored. The purpose of this lesson is to provide patients with the opportunity to review the basic principles of the [place], discuss the value of eating mindfully and how to measure internal cues of hunger and fullness using the Hunger Scale. Patients will also discuss reasons for non-hunger eating and learn strategies to use for controlling emotional eating.  Targeting Your Nutrition Priorities Clinical staff led group instruction and group discussion with PowerPoint presentation and patient guidebook. To enhance the learning environment the use of posters, models and videos may be added. Patients will learn how to determine their genetic susceptibility to disease by reviewing their family history. Patients will gain insight into the importance of diet as part of an overall healthy lifestyle in mitigating the impact of genetics and other environmental insults. The purpose of this lesson is to provide patients with the opportunity to assess their personal nutrition priorities by looking at their family history, their own health  history and current risk factors. Patients will also be able to discuss ways of prioritizing and modifying the [place] for their highest risk areas  Menu  Clinical staff led group instruction and group discussion with PowerPoint presentation and patient guidebook. To enhance the learning environment the use of posters, models and videos may be added. Using menus brought in from [employer], or printed from [employer], patients will apply the [place] dining out guidelines that were presented in the [employer] video. Patients will also be able to practice these guidelines in a variety of provided scenarios. The purpose of this lesson is to provide patients with the opportunity to practice hands-on learning of the [place] with actual menus and practice scenarios.  Label Reading Clinical staff led group instruction and group discussion with PowerPoint presentation and patient guidebook. To enhance the learning environment the use of posters, models and videos may be added. Patients will review and discuss the Pritikin label reading guidelines presented in Pritikin's Label Reading Educational series video. Using fool labels brought in from local grocery stores and markets, patients will apply the label reading guidelines and determine if the packaged food meet the Pritikin guidelines. The purpose of this lesson is to provide patients with the opportunity to review, discuss, and practice hands-on learning of the Pritikin Label Reading guidelines with actual packaged food labels. Mobile Workshops are designed to teach patients ways to prepare quick, simple, and affordable recipes at home. The importance of nutrition's role in chronic disease risk reduction is reflected in its emphasis in the overall Pritikin program. By learning how to prepare essential core Pritikin Eating Plan recipes, patients will increase  control over what they eat; be able to customize the flavor of foods without the use of added salt, sugar, or fat; and improve the quality of the food they consume. By learning a set of core recipes which are easily assembled, quickly prepared, and affordable, patients are more likely to prepare more healthy foods at home. These workshops focus on convenient breakfasts, simple entres, side dishes, and desserts which can be prepared with minimal effort and are consistent with nutrition recommendations for  cardiovascular risk reduction. Cooking [employer] are taught by a Engineer, materials (RD) who has been trained by the [employer]. The chef or RD has a clear understanding of the importance of minimizing - if not completely eliminating - added fat, sugar, and sodium in recipes. Throughout the series of [place] Workshop sessions, patients will learn about healthy ingredients and efficient methods of cooking to build confidence in their capability to prepare    Cooking School weekly topics:  Adding Flavor- Sodium-Free  Fast and Healthy Breakfasts  Powerhouse Plant-Based Proteins  Satisfying Salads and Dressings  Simple Sides and Sauces  International Cuisine-Spotlight on the [place] Zones  Delicious Desserts  Savory Soups  Efficiency Cooking - Meals in a Snap  Tasty Appetizers and Snacks  Comforting Weekend Breakfasts  One-Pot Wonders   Fast Evening Meals  Easy [place] (Psychosocial): New Thoughts, New Behaviors Clinical staff led group instruction and group discussion with PowerPoint presentation and patient guidebook. To enhance the learning environment the use of posters, models and videos may be added. Patients will learn and practice techniques for developing effective health and lifestyle goals. Patients will be able to effectively apply the goal setting process learned to develop at  least one new personal goal.  The purpose of this lesson is to expose patients to a new skill set of behavior modification techniques such as techniques setting SMART goals, overcoming barriers, and achieving new thoughts and new behaviors.  Managing Moods and Relationships Clinical staff led group instruction and group discussion with PowerPoint presentation and patient guidebook. To enhance the learning environment the use of posters, models and videos may be added. Patients will learn how emotional and chronic stress factors can impact their health and relationships. They will learn healthy ways to manage their moods and utilize positive coping mechanisms. In addition, ICR patients will learn ways to improve communication skills. The purpose of this lesson is to expose patients to ways of understanding how one's mood and health are intimately connected. Developing a healthy outlook can help build positive relationships and connections with others. Patients will understand the importance of utilizing effective communication skills that include actively listening and being heard. They will learn and understand the importance of the "4 Cs" and especially Connections in fostering of a Healthy Mind-Set.  Healthy Sleep for a Healthy Heart Clinical staff led group instruction and group discussion with PowerPoint presentation and patient guidebook. To enhance the learning environment the use of posters, models and videos may be added. At the conclusion of this workshop, patients will be able to demonstrate knowledge of the importance of sleep to overall health, well-being, and quality of life. They will understand the symptoms of, and treatments for, common sleep disorders. Patients will also be able to identify daytime and nighttime behaviors which impact sleep, and they will be able to apply these tools to help manage sleep-related challenges. The purpose of this lesson is to provide patients with a general  overview of sleep and outline the importance of quality sleep. Patients will learn about a few of the most common sleep disorders. Patients will also be introduced to the concept of "sleep hygiene," and discover ways to self-manage certain sleeping problems through simple daily behavior changes. Finally, the workshop will motivate patients by clarifying the links between quality sleep and their goals of heart-healthy living.   Recognizing and Reducing Stress Clinical staff led group instruction and group  discussion with PowerPoint presentation and patient guidebook. To enhance the learning environment the use of posters, models and videos may be added. At the conclusion of this workshop, patients will be able to understand the types of stress reactions, differentiate between acute and chronic stress, and recognize the impact that chronic stress has on their health. They will also be able to apply different coping mechanisms, such as reframing negative self-talk. Patients will have the opportunity to practice a variety of stress management techniques, such as deep abdominal breathing, progressive muscle relaxation, and/or guided imagery.  The purpose of this lesson is to educate patients on the role of stress in their lives and to provide healthy techniques for coping with it.  Learning Barriers/Preferences:  Learning Barriers/Preferences - 04/06/22 1158       Learning Barriers/Preferences   Learning Barriers Sight   wears glasses   Learning Preferences Audio;Computer/Internet;Group Instruction;Individual Instruction;Skilled Demonstration;Pictoral;Verbal Instruction;Video;Written Material             Education Topics:  Knowledge Questionnaire Score:  Knowledge Questionnaire Score - 04/06/22 1151       Knowledge Questionnaire Score   Pre Score 18/24             Core Components/Risk Factors/Patient Goals at Admission:  Personal Goals and Risk Factors at Admission - 04/06/22 1152        Core Components/Risk Factors/Patient Goals on Admission    Weight Management Yes;Obesity;Weight Loss    Intervention Weight Management: Provide education and appropriate resources to help participant work on and attain dietary goals.;Weight Management: Develop a combined nutrition and exercise program designed to reach desired caloric intake, while maintaining appropriate intake of nutrient and fiber, sodium and fats, and appropriate energy expenditure required for the weight goal.;Weight Management/Obesity: Establish reasonable short term and long term weight goals.;Obesity: Provide education and appropriate resources to help participant work on and attain dietary goals.    Admit Weight 225 lb (102.1 kg)    Expected Outcomes Short Term: Continue to assess and modify interventions until short term weight is achieved;Long Term: Adherence to nutrition and physical activity/exercise program aimed toward attainment of established weight goal;Weight Loss: Understanding of general recommendations for a balanced deficit meal plan, which promotes 1-2 lb weight loss per week and includes a negative energy balance of [phone] kcal/d;Understanding recommendations for meals to include 15-35% energy as protein, 25-35% energy from fat, 35-60% energy from carbohydrates, less than '200mg'$  of dietary cholesterol, 20-35 gm of total fiber daily;Understanding of distribution of calorie intake throughout the day with the consumption of 4-5 meals/snacks    Hypertension Yes    Intervention Provide education on lifestyle modifcations including regular physical activity/exercise, weight management, moderate sodium restriction and increased consumption of fresh fruit, vegetables, and low fat dairy, alcohol moderation, and smoking cessation.;Monitor prescription use compliance.    Expected Outcomes Short Term: Continued assessment and intervention until BP is < 140/53m HG in hypertensive participants. < 130/851mHG in hypertensive  participants with diabetes, heart failure or chronic kidney disease.;Long Term: Maintenance of blood pressure at goal levels.             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 Introduction to pritikin education program/ intesive cardiac rehab. Initial orientation packet reviewed with patient.  Comments: Participant attended orientation for the cardiac rehabilitation program on  04/06/2022  to perform initial intake and exercise walk test. Patient introduced to the [place] education and orientation packet was reviewed. Completed 6-minute walk test, measurements, initial ITP, and exercise prescription. Vital signs stable. Telemetry- accelerated junctional rhythm, asymptomatic.   Service time was from [phone] to [phone].     [name], MS 04/06/2022 12:02 PM

## 2022-04-06 NOTE — Progress Notes (Signed)
Cardiac Rehab Medication Review   Does the patient  feel that his/her medications are working for him/her?  YES  Has the patient been experiencing any side effects to the medications prescribed?  NO  Does the patient measure his/her own blood pressure or blood glucose at home?  NO   Does the patient have any problems obtaining medications due to transportation or finances?   NO  Understanding of regimen: excellent Understanding of indications: excellent Potential of compliance: excellent    Comments: Melissa Riley has a great understanding of her medications and what they are for. She does not have a BP cuff at home.    [name], MS 04/06/2022 10:14 AM

## 2022-04-07 NOTE — Progress Notes (Signed)
Cardiology Office Note:   Date:  04/08/2022  NAME:  Melissa Riley    MRN: [id number] DOB:  11-18-1965   PCP:  Joycelyn Man, FNP  Cardiologist:  Evalina Field, MD  Electrophysiologist:  None   Referring MD: Joycelyn Man, FNP   Chief Complaint  Patient presents with   Follow-up         History of Present Illness:   Melissa Riley is a 57 y.o. female with a hx of MV repair,  CHF, HTN, pAF who presents for follow-up.  She reports she is doing well.  Denies any chest pain or trouble breathing.  She will work with cardiac rehab.  She will have a repeat chest x-ray in the next few weeks.  Most recent chest x-ray with no pleural effusion.  She is on aspirin.  She is on Eliquis.  She is not aware of any A-fib episodes.  We discussed stopping her amiodarone.  She had a 2% A-fib burden.  Her CHA2DS2-VASc is 3 with reduced ejection fraction.  She should continue Eliquis.  We discussed optimizing her blood pressure regimen.  She is okay to do this.  She understands she needs antibiotics before dental work.  Her examination is unremarkable.  She has had some drainage from her surgical site.  She is working with CT surgery on this.  As far as I am concerned she has no restrictions.  She does have some per CT surgery.  Aerobically she can do what she would like.  CV exam unremarkable.  Problem List Severe Mitral Regurgitation  -34 mm annuloplasty ring 02/16/2022 2. Systolic HF -EF [id number] 3. Paroxysmal Afib -repeated episodes after MVR -CHADSVASC=3 (female, HTN, CHF) -2% burden on zio  4. HTN  Past Medical History: Past Medical History:  Diagnosis Date   Arthritis    Heart murmur    Hypertension    Hypothyroidism    Mitral regurgitation    Uterine fibroid     Past Surgical History: Past Surgical History:  Procedure Laterality Date   BREAST BIOPSY Left    " Patient states over 20 years ago"   MITRAL VALVE REPAIR N/A 02/16/2022   Procedure: MITRAL VALVE REPAIR (MVR) USING 34 MM  EDWARDS PHYSIO II ANNULOPLASTY RING;  Surgeon: Neomia Glass, MD;  Location: [facility];  Service: Open Heart Surgery;  Laterality: N/A;   RIGHT/LEFT HEART CATH AND CORONARY ANGIOGRAPHY N/A 04/05/2021   Procedure: RIGHT/LEFT HEART CATH AND CORONARY ANGIOGRAPHY;  Surgeon: Jettie Booze, MD;  Location: [place] [facility];  Service: Cardiovascular;  Laterality: N/A;   TEE WITHOUT CARDIOVERSION N/A 02/10/2017   Procedure: TRANSESOPHAGEAL ECHOCARDIOGRAM (TEE);  Surgeon: Adrian Prows, MD;  Location: [facility];  Service: Cardiovascular;  Laterality: N/A;   TEE WITHOUT CARDIOVERSION N/A 04/05/2021   Procedure: TRANSESOPHAGEAL ECHOCARDIOGRAM (TEE);  Surgeon: [name], MD;  Location: [facility] ENDOSCOPY;  Service: Cardiovascular;  Laterality: N/A;   TEE WITHOUT CARDIOVERSION N/A 02/16/2022   Procedure: TRANSESOPHAGEAL ECHOCARDIOGRAM (TEE);  Surgeon: Neomia Glass, MD;  Location: [place];  Service: Open Heart Surgery;  Laterality: N/A;   TUBAL LIGATION     WISDOM TOOTH EXTRACTION Bilateral 2004    Current Medications: Current Meds  Medication Sig   apixaban (ELIQUIS) 5 MG TABS tablet Take 1 tablet (5 mg total) by mouth 2 (two) times daily.   Ascorbic Acid (VITAMIN C PO) Take 1 tablet by mouth daily.   aspirin EC 81 MG tablet Take 81 mg by mouth  daily. Swallow whole.   Cholecalciferol (VITAMIN D3) 25 MCG (1000 UT) CHEW Chew by mouth daily. 2 gummies daily   ferrous sulfate 324 MG TBEC Take 324 mg by mouth 2 (two) times daily.   levothyroxine (SYNTHROID) 25 MCG tablet Take 25 mcg by mouth daily before breakfast.   sacubitril-valsartan (ENTRESTO) 24-26 MG Take 1 tablet by mouth 2 (two) times daily.   spironolactone (ALDACTONE) 25 MG tablet Take 0.5 tablets (12.5 mg total) by mouth daily.   [DISCONTINUED] amiodarone (PACERONE) 200 MG tablet Take 2 tablets (400 mg total) by mouth 2 (two) times daily for 6 days THEN take 1 tablet (200 mg total) twice daily thereafter.   [DISCONTINUED] amLODipine  (NORVASC) 5 MG tablet Take 5 mg by mouth daily.   [DISCONTINUED] hydrochlorothiazide (HYDRODIURIL) 25 MG tablet Take 25 mg by mouth daily.   [DISCONTINUED] metoprolol succinate (TOPROL XL) 25 MG 24 hr tablet Take 1 tablet (25 mg total) by mouth daily.     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  Tobacco Use   Smoking status: Never   Smokeless tobacco: Never  Vaping Use   Vaping Use: Not on file  Substance and Sexual Activity   Alcohol use: No   Drug use: No   Sexual activity: Not Currently    Birth control/protection: Surgical  Other Topics Concern   Not on file  Social History Narrative   Not on file   Social Determinants of Health   Financial Resource Strain: Not on file  Food Insecurity: No Food Insecurity (02/17/2022)   Hunger Vital Sign    Worried About Running Out of Food in the Last Year: Never true    Ran Out of Food in the Last Year: Never true  Transportation Needs: No Transportation Needs (02/17/2022)   PRAPARE - Hydrologist (Medical): No    Lack of Transportation (Non-Medical): No  Physical Activity: Not on file  Stress: Not on file  Social Connections: Not on file     Family History: The patient's family history includes Breast cancer in her cousin and maternal aunt; Heart disease in her maternal aunt and mother; Hypertension in her mother.  ROS:   All other ROS reviewed and negative. Pertinent positives noted in the HPI.     EKGs/Labs/Other Studies Reviewed:   The following studies were personally reviewed by me today:   Recent Labs: 02/14/2022: ALT 16 02/22/2022: TSH 7.364 02/24/2022: BUN 13; Creatinine, Ser 0.93; Magnesium 2.2; Potassium 3.8; Sodium  137 03/14/2022: Hemoglobin 10.1; Platelets 319   Recent Lipid Panel No results found for: "CHOL", "TRIG", "HDL", "CHOLHDL", "VLDL", "LDLCALC", "LDLDIRECT"  Physical Exam:   VS:  BP (!) 144/62 (BP Location: Left Arm, Patient Position: Sitting, Cuff Size: Large)   Pulse 91   Ht 5\' 2"  (1.575 m)   Wt 221 lb 3.2 oz (100.3 kg)   LMP  (LMP Unknown)   SpO2 97%   BMI 40.46 kg/m    Wt Readings from Last 3 Encounters:  04/08/22 221 lb 3.2 oz (100.3 kg)  04/06/22 225 lb (102.1 kg)  03/16/22 224 lb (101.6 kg)    General: Well nourished, well developed, in no  acute distress Head: Atraumatic, normal size  Eyes: PEERLA, EOMI  Neck: Supple, no JVD Endocrine: No thryomegaly Cardiac: Normal S1, S2; RRR; no murmurs, rubs, or gallops Lungs: Clear to auscultation bilaterally, no wheezing, rhonchi or rales  Abd: Soft, nontender, no hepatomegaly  Ext: No edema, pulses 2+ Musculoskeletal: No deformities, BUE and BLE strength normal and equal Skin: Warm and dry, no rashes   Neuro: Alert and oriented to person, place, time, and situation, CNII-XII grossly intact, no focal deficits  Psych: Normal mood and affect   ASSESSMENT:   Melissa Riley is a 57 y.o. female who presents for the following: 1. Nonrheumatic mitral valve regurgitation   2. S/P MVR (mitral valve repair)   3. Chronic systolic heart failure ([place])   4. Primary hypertension   5. Paroxysmal atrial fibrillation (HCC)     PLAN:   1. Nonrheumatic mitral valve regurgitation 2. S/P MVR (mitral valve repair) -Status post mitral valve repair.  Most recent echo shows stable repair.  EF has dropped which is expected.  She is on aspirin 81 mg daily.  She needs antibiotics for dental work.  She will follow with CT surgery regarding drainage of the surgical site.  Chest x-ray without pleural effusions.  Overall doing well.  She will participate with cardiac rehab.  3. Chronic systolic heart failure ([place]) 4. Primary hypertension -EF now  40-45%.  Not unexpected after mitral valve surgery.  She had severe mitral valve regurgitation for quite a while.  Stop amlodipine.  Stop HCTZ.  Start Entresto 24-26 mg twice daily.  Start Aldactone 12.5 mg daily.  Continue metoprolol succinate 25 mg daily.  She will see [place] back in 3 months to recheck how she is doing.  Unclear if her EF will actually improve in the setting of volume overload from mitral valve regurgitation.  We will also check a BMP, CBC and TSH.  All of her labs were improving after hospitalization.  5. Paroxysmal atrial fibrillation (HCC) -CHA2DS2-VASc equals 3.  Female, hypertension, CHF.  No clinical heart failure but her EF is reduced.  I favor anticoagulation.  She has a 2% A-fib burden.  We will stop amiodarone.  Continue Eliquis 5 mg twice daily.  Continue metoprolol succinate 25 mg daily.  We discussed the Kardia mobile device.  I would like for her to monitor her heart rhythm at home.  Continues to have A-fib episodes we may want to consider evaluation for A-fib ablation.  A 2% A-fib burden can likely be managed conservatively.  Disposition: Return in about 3 months (around 07/09/2022).  Medication Adjustments/Labs and Tests Ordered: Current medicines are reviewed at length with the patient today.  Concerns regarding medicines are outlined above.  Orders Placed This Encounter  Procedures   Basic metabolic panel   CBC   TSH   Meds ordered this encounter  Medications   metoprolol succinate (TOPROL XL) 25 MG 24 hr tablet    Sig: Take 1 tablet (25 mg total) by mouth daily.    Dispense:  30 tablet    Refill:  5   sacubitril-valsartan (ENTRESTO) 24-26 MG    Sig: Take 1 tablet by mouth 2 (two) times daily.    Dispense:  60 tablet    Refill:  3   spironolactone (ALDACTONE) 25 MG tablet    Sig: Take 0.5 tablets (12.5 mg total) by mouth daily.    Dispense:  45 tablet    Refill:  3    Patient Instructions  Medication Instructions:  STOP Amiodarone STOP Amlodipine   STOP HCTZ   START Entresto 24/26 twice daily  START Aldactone (spironolactone) 12.5 mg daily   *If you need a refill on your cardiac medications before your next appointment, please call your pharmacy*   Lab Work: CBC, TSH, BMET today   If you have labs (blood work) drawn today and your tests are completely normal, you will receive your results only by: [place] (if you have MyChart) OR A paper copy in the mail If you have any lab test that is abnormal or we need to change your treatment, we will call you to review the results.    Follow-Up: At [facility], you and your health needs are our priority.  As part of our continuing mission to provide you with exceptional heart care, we have created designated Provider Care Teams.  These Care Teams include your primary Cardiologist (physician) and Advanced Practice Providers (APPs -  Physician Assistants and Nurse Practitioners) who all work together to provide you with the care you need, when you need it.  We recommend signing up for the patient portal called "MyChart".  Sign up information is provided on this After Visit Summary.  MyChart is used to connect with patients for Virtual Visits (Telemedicine).  Patients are able to view lab/test results, encounter notes, upcoming appointments, etc.  Non-urgent messages can be sent to your provider as well.   To learn more about what you can do with MyChart, go to [email or web address].    Your next appointment:   3 month(s)  Provider:   Evalina Field, MD      KardiaMobile [email or web address]        FDA-cleared, clinical grade mobile EKG monitor: Jodelle Red is the most clinically-validated mobile EKG used by the world's leading cardiac care medical professionals With Basic service, know instantly if your heart rhythm is normal or if atrial fibrillation is detected, and email the last single EKG recording to yourself or your doctor Premium  service, available for purchase through the Kardia app for $9.99 per month or $99 per year, includes unlimited history and storage of your EKG recordings, a monthly EKG summary report to share with your doctor, along with the ability to track your blood pressure, activity and weight Includes one KardiaMobile phone clip FREE SHIPPING: Standard delivery 1-3 business days. Orders placed by 11:00am PST will ship that afternoon. Otherwise, will ship next business day. All orders ship via [employer] from [place], [place] - sending an EKG Download app and set up profile. Run EKG - by placing 1-2 fingers on the silver plates After EKG is complete - Download PDF  - Skip password (if you apply a password the provider will need it to view the EKG) Click share button (square with upward arrow) in bottom left corner To send: choose MyChart (first time log into MyChart)  Pop up window about sending ECG Click continue Choose type of message Choose provider Type subject and message Click send (EKG should be attached)  - To send additional EKGs in one message click the paperclip image and bottom of page to attach.       Time Spent with Patient: I have spent a total of 35 minutes with patient reviewing hospital notes, telemetry, EKGs, labs and examining the patient as well as establishing an assessment and plan that was discussed with the patient.  > 50% of time was spent in direct patient care.  Signed, Addison Naegeli. O'Neal,  MD, [place]  [address] [place] [employer], [place] [address] [phone]  04/08/2022 9:08 AM

## 2022-04-08 ENCOUNTER — Telehealth: Payer: Self-pay | Admitting: Cardiovascular Disease

## 2022-04-08 ENCOUNTER — Encounter: Payer: Self-pay | Admitting: Cardiovascular Disease

## 2022-04-08 ENCOUNTER — Ambulatory Visit: Payer: Commercial Managed Care - HMO | Attending: Cardiovascular Disease | Admitting: Cardiovascular Disease

## 2022-04-08 VITALS — BP 144/62 | HR 91 | Ht 62.0 in | Wt 221.2 lb

## 2022-04-08 DIAGNOSIS — I10 Essential (primary) hypertension: Secondary | ICD-10-CM

## 2022-04-08 DIAGNOSIS — Z9889 Other specified postprocedural states: Secondary | ICD-10-CM | POA: Diagnosis not present

## 2022-04-08 DIAGNOSIS — I340 Nonrheumatic mitral (valve) insufficiency: Secondary | ICD-10-CM | POA: Diagnosis not present

## 2022-04-08 DIAGNOSIS — I5022 Chronic systolic (congestive) heart failure: Secondary | ICD-10-CM | POA: Diagnosis not present

## 2022-04-08 DIAGNOSIS — I480 Paroxysmal atrial fibrillation: Secondary | ICD-10-CM

## 2022-04-08 MED ORDER — SPIRONOLACTONE 25 MG PO TABS: 12.5000 mg | ORAL_TABLET | Freq: Every day | ORAL | 3 refills | Status: DC

## 2022-04-08 MED ORDER — METOPROLOL SUCCINATE ER 25 MG PO TB24: 25.0000 mg | ORAL_TABLET | Freq: Every day | ORAL | 5 refills | Status: DC

## 2022-04-08 MED ORDER — SACUBITRIL-VALSARTAN 24-26 MG PO TABS: 1.0000 | ORAL_TABLET | Freq: Two times a day (BID) | ORAL | 3 refills | Status: DC

## 2022-04-08 NOTE — Telephone Encounter (Signed)
Pt was wanting to know if she can sleep on her side in the bed or if she has to sleep while sitting up in the bed?

## 2022-04-08 NOTE — Telephone Encounter (Signed)
Patient ask regarding sleep. She forgot to ask at appt.  She has been sleeping sitting up since her admission to hospital February 24.  She ask should she continue to sleep this way ior is it OK to sleep on her side.  She states she does not have breathing issues when flat , but slept upright due to heart issues and was worried if she is OK to sleep in a bed and on her side.  (She is not a back sleeper).  Please advise

## 2022-04-08 NOTE — Telephone Encounter (Signed)
Melissa Rile, MD  P Cv Div Nl Triage Cc: [name], LPN Caller: Unspecified (Today,  3:37 PM) She can sleep any way she likes. No restrictions.  Called patient and gave the above message. She verbalized understanding.

## 2022-04-08 NOTE — Patient Instructions (Addendum)
Medication Instructions:   STOP Amiodarone STOP Amlodipine  STOP HCTZ   START Entresto 24/26 twice daily  START Aldactone (spironolactone) 12.5 mg daily   *If you need a refill on your cardiac medications before your next appointment, please call your pharmacy*   Lab Work: CBC, TSH, BMET today   If you have labs (blood work) drawn today and your tests are completely normal, you will receive your results only by: [place] (if you have MyChart) OR A paper copy in the mail If you have any lab test that is abnormal or we need to change your treatment, we will call you to review the results.    Follow-Up: At [facility], you and your health needs are our priority.  As part of our continuing mission to provide you with exceptional heart care, we have created designated Provider Care Teams.  These Care Teams include your primary Cardiologist (physician) and Advanced Practice Providers (APPs -  Physician Assistants and Nurse Practitioners) who all work together to provide you with the care you need, when you need it.  We recommend signing up for the patient portal called "MyChart".  Sign up information is provided on this After Visit Summary.  MyChart is used to connect with patients for Virtual Visits (Telemedicine).  Patients are able to view lab/test results, encounter notes, upcoming appointments, etc.  Non-urgent messages can be sent to your provider as well.   To learn more about what you can do with MyChart, go to [email or web address].    Your next appointment:   3 month(s)  Provider:   Evalina Field, MD      KardiaMobile [email or web address]        FDA-cleared, clinical grade mobile EKG monitor: Jodelle Red is the most clinically-validated mobile EKG used by the world's leading cardiac care medical professionals With Basic service, know instantly if your heart rhythm is normal or if atrial fibrillation is detected, and email the  last single EKG recording to yourself or your doctor Premium service, available for purchase through the Kardia app for $9.99 per month or $99 per year, includes unlimited history and storage of your EKG recordings, a monthly EKG summary report to share with your doctor, along with the ability to track your blood pressure, activity and weight Includes one KardiaMobile phone clip FREE SHIPPING: Standard delivery 1-3 business days. Orders placed by 11:00am PST will ship that afternoon. Otherwise, will ship next business day. All orders ship via [employer] from [place], [place] - sending an EKG Download app and set up profile. Run EKG - by placing 1-2 fingers on the silver plates After EKG is complete - Download PDF  - Skip password (if you apply a password the provider will need it to view the EKG) Click share button (square with upward arrow) in bottom left corner To send: choose MyChart (first time log into MyChart)  Pop up window about sending ECG Click continue Choose type of message Choose provider Type subject and message Click send (EKG should be attached)  - To send additional EKGs in one message click the paperclip image and bottom of page to attach.

## 2022-04-09 LAB — BASIC METABOLIC PANEL
BUN/Creatinine Ratio: 14 (ref 9–23)
BUN: 11 mg/dL (ref 6–24)
CO2: 21 mmol/L (ref 20–29)
Calcium: 9.5 mg/dL (ref 8.7–10.2)
Chloride: 101 mmol/L (ref 96–106)
Creatinine, Ser: 0.79 mg/dL (ref 0.57–1.00)
Glucose: 137 mg/dL — ABNORMAL HIGH (ref 70–99)
Potassium: 3.5 mmol/L (ref 3.5–5.2)
Sodium: 140 mmol/L (ref 134–144)
eGFR: 88 mL/min/{1.73_m2} (ref >59)

## 2022-04-09 LAB — CBC
Hematocrit: 37.8 % (ref 34.0–46.6)
Hemoglobin: 12.3 g/dL (ref 11.1–15.9)
MCH: 30.1 pg (ref 26.6–33.0)
MCHC: 32.5 g/dL (ref 31.5–35.7)
MCV: 92 fL (ref 79–97)
Platelets: 269 10*3/uL (ref 150–450)
RBC: 4.09 x10E6/uL (ref 3.77–5.28)
RDW: 13 % (ref 11.7–15.4)
WBC: 6.3 10*3/uL (ref 3.4–10.8)

## 2022-04-09 LAB — TSH: TSH: 2.16 u[IU]/mL (ref 0.450–4.500)

## 2022-04-11 ENCOUNTER — Encounter (HOSPITAL_COMMUNITY)
Admission: RE | Admit: 2022-04-11 | Discharge: 2022-04-11 | Disposition: A | Discharge status: Home / Self Care | Payer: Commercial Managed Care - HMO | Source: Ambulatory Visit | Attending: Cardiovascular Disease | Admitting: Cardiovascular Disease

## 2022-04-11 DIAGNOSIS — Z9889 Other specified postprocedural states: Secondary | ICD-10-CM | POA: Diagnosis not present

## 2022-04-11 NOTE — Progress Notes (Signed)
Pt started cardiac rehab group exercise and education today.  Pt tolerated light exercise without difficulty. VSS,  asymptomatic.  Medication list reconciled this morning with patient providing updates to medication regimen. Pt denies barriers to medication compliance.  PSYCHOSOCIAL ASSESSMENT:  PHQ9= 0. Pt exhibits positive coping skills, hopeful outlook with supportive family. No psychosocial needs identified at this time or interventions necessary.  Pt oriented to exercise equipment and routine.  Understanding verbalized.

## 2022-04-11 NOTE — Progress Notes (Signed)
Cardiac Individual Treatment Plan  Patient Details  Name: Melissa Riley MRN: [id number] Date of Birth: [birth year] Referring Provider:   Flowsheet Row CARDIAC REHAB PHASE II ORIENTATION from 04/06/2022 in [facility] for [facility], V[facility], & [place]  Referring Provider Eleonore Chiquito, MD       Initial Encounter Date:  [place] from 04/06/2022 in [facility], Vascular, & [facility]  Date 04/06/22       Visit Diagnosis: S/P mitral valve repair  Patient's Home Medications on Admission:  Current Outpatient Medications:    apixaban (ELIQUIS) 5 MG TABS tablet, Take 1 tablet (5 mg total) by mouth 2 (two) times daily., Disp: 60 tablet, Rfl: 2   Ascorbic Acid (VITAMIN C PO), Take 1 tablet by mouth daily., Disp: , Rfl:    aspirin EC 81 MG tablet, Take 81 mg by mouth daily. Swallow whole., Disp: , Rfl:    Cholecalciferol (VITAMIN D3) 25 MCG (1000 UT) CHEW, Chew by mouth daily. 2 gummies daily, Disp: , Rfl:    ferrous sulfate 324 MG TBEC, Take 324 mg by mouth 2 (two) times daily., Disp: , Rfl:    hydrocortisone cream 1 %, Apply topically 3 (three) times daily as needed for itching., Disp: 28 g, Rfl: 0   levothyroxine (SYNTHROID) 25 MCG tablet, Take 25 mcg by mouth daily before breakfast., Disp: , Rfl:    metoprolol succinate (TOPROL XL) 25 MG 24 hr tablet, Take 1 tablet (25 mg total) by mouth daily., Disp: 30 tablet, Rfl: 5   sacubitril-valsartan (ENTRESTO) 24-26 MG, Take 1 tablet by mouth 2 (two) times daily., Disp: 60 tablet, Rfl: 3   spironolactone (ALDACTONE) 25 MG tablet, Take 0.5 tablets (12.5 mg total) by mouth daily., Disp: 45 tablet, Rfl: 3  Past Medical History: Past Medical History:  Diagnosis Date   Arthritis    Heart murmur    Hypertension    Hypothyroidism    Mitral regurgitation    Uterine fibroid     Tobacco Use: Social History   Tobacco Use  Smoking Status Never   Smokeless Tobacco Never    Labs: Review Flowsheet       Latest Ref Rng & Units 04/05/2021 02/14/2022 02/16/2022  Labs for ITP Cardiac and Pulmonary Rehab  Hemoglobin A1c 4.8 - 5.6 % - 5.2  -  PH, Arterial 7.35 - 7.45 7.414  7.5  7.398  7.367  7.270  7.383  7.356  7.367  7.417  7.386   PCO2 arterial 32 - 48 mmHg 30.0  34  28.6  29.5  42.2  32.9  39.6  39.5  34.1  34.1   Bicarbonate 20.0 - 28.0 mmol/L 20.8  21.0  19.2  26.5  17.6  17.0  19.8  19.6  22.2  22.7  41.1  21.9  20.5   TCO2 22 - 32 mmol/L 22  22  20   - 19  18  21  21  22  24  23  24  24  27   43  23  24  21  24    Acid-base deficit 0.0 - 2.0 mmol/L 3.0  3.0  4.0  - 6.0  7.0  7.0  5.0  3.0  2.0  2.0  4.0   O2 Saturation % 82  83  97  100  98  99  98  100  100  100  84  100  100  Capillary Blood Glucose: Lab Results  Component Value Date   GLUCAP 81 02/25/2022   GLUCAP 98 02/25/2022   GLUCAP 110 (H) 02/24/2022   GLUCAP 80 02/24/2022   GLUCAP 91 02/24/2022     Exercise Target Goals: Exercise Program Goal: Individual exercise prescription set using results from initial 6 min walk test and THRR while considering  patient's activity barriers and safety.   Exercise Prescription Goal: Initial exercise prescription builds to 30-45 minutes a day of aerobic activity, 2-3 days per week.  Home exercise guidelines will be given to patient during program as part of exercise prescription that the participant will acknowledge.  Activity Barriers & Risk Stratification:  Activity Barriers & Cardiac Risk Stratification - 04/06/22 1049       Activity Barriers & Cardiac Risk Stratification   Activity Barriers Arthritis;Deconditioning;Incisional Pain;Balance Concerns;History of Falls;Other (comment)    Comments sternal precautions    Cardiac Risk Stratification High   <5 METS on 6MWT            6 Minute Walk:  6 Minute Walk     Row Name 04/06/22 1155         6 Minute Walk   Phase Initial     Distance 1021 feet     Walk  Time 6 minutes     # of Rest Breaks 0     MPH 1.9     METS 2.56     RPE 11     Perceived Dyspnea  0     VO2 Peak 8.94     Symptoms No     Resting HR 70 bpm     Resting BP 136/82     Resting Oxygen Saturation  98 %     Exercise Oxygen Saturation  during 6 min walk 98 %     Max Ex. HR 103 bpm     Max Ex. BP 140/86     2 Minute Post BP 126/80              Oxygen Initial Assessment:   Oxygen Re-Evaluation:   Oxygen Discharge (Final Oxygen Re-Evaluation):   Initial Exercise Prescription:  Initial Exercise Prescription - 04/06/22 1000       Date of Initial Exercise RX and Referring Provider   Date 04/06/22    Referring Provider Eleonore Chiquito, MD    Expected Discharge Date 06/03/22      NuStep   Level 1    SPM 60    Minutes 15    METs 2      Track   Laps 10   big track   Minutes 15    METs 2      Prescription Details   Frequency (times per week) 3    Duration Progress to 30 minutes of continuous aerobic without signs/symptoms of physical distress      Intensity   THRR 40-80% of Max Heartrate 66-132    Ratings of Perceived Exertion 11-13    Perceived Dyspnea 0-4      Progression   Progression Continue progressive overload as per policy without signs/symptoms or physical distress.      Resistance Training   Training Prescription Yes    Weight 2    Reps 10-15             Perform Capillary Blood Glucose checks as needed.  Exercise Prescription Changes:   Exercise Prescription Changes     Row Name 04/11/22 0800  Response to Exercise   Blood Pressure (Admit) 132/80       Blood Pressure (Exercise) 130/90       Blood Pressure (Exit) 114/86       Heart Rate (Admit) 90 bpm       Heart Rate (Exercise) 117 bpm       Heart Rate (Exit) 99 bpm       Rating of Perceived Exertion (Exercise) 13       Perceived Dyspnea (Exercise) 0       Symptoms none       Comments Pt first day in CRP2 program       Duration Continue with 30 min of  aerobic exercise without signs/symptoms of physical distress.       Intensity THRR unchanged         Progression   Progression Continue to progress workloads to maintain intensity without signs/symptoms of physical distress.       Average METs 2.09         Resistance Training   Training Prescription Yes       Weight 2       Reps 10-15         NuStep   Level 1       SPM 81       Minutes 15       METs 1.9         Track   Laps 10       Minutes 15       METs 2.28                Exercise Comments:   Exercise Comments     Row Name 04/11/22 0836           Exercise Comments Pt first day in CRP2 program. Pt tolerated exercise well with an average MET level of 2.09. Pt is learning her THRR, ExRx, and RPE scale. Overall pt is off to a great start. Will continue to monitor and progress workloads as tolerated without s/sx.                Exercise Goals and Review:   Exercise Goals     Row Name 04/06/22 1050             Exercise Goals   Increase Physical Activity Yes       Intervention Provide advice, education, support and counseling about physical activity/exercise needs.;Develop an individualized exercise prescription for aerobic and resistive training based on initial evaluation findings, risk stratification, comorbidities and participant's personal goals.       Expected Outcomes Short Term: Attend rehab on a regular basis to increase amount of physical activity.;Long Term: Exercising regularly at least 3-5 days a week.;Long Term: Add in home exercise to make exercise part of routine and to increase amount of physical activity.       Increase Strength and Stamina Yes       Intervention Provide advice, education, support and counseling about physical activity/exercise needs.;Develop an individualized exercise prescription for aerobic and resistive training based on initial evaluation findings, risk stratification, comorbidities and participant's personal goals.        Expected Outcomes Short Term: Increase workloads from initial exercise prescription for resistance, speed, and METs.;Short Term: Perform resistance training exercises routinely during rehab and add in resistance training at home;Long Term: Improve cardiorespiratory fitness, muscular endurance and strength as measured by increased METs and functional capacity (6MWT)       Able to understand  and use rate of perceived exertion (RPE) scale Yes       Intervention Provide education and explanation on how to use RPE scale       Expected Outcomes Short Term: Able to use RPE daily in rehab to express subjective intensity level;Long Term:  Able to use RPE to guide intensity level when exercising independently       Knowledge and understanding of Target Heart Rate Range (THRR) Yes       Intervention Provide education and explanation of THRR including how the numbers were predicted and where they are located for reference       Expected Outcomes Short Term: Able to state/look up THRR;Long Term: Able to use THRR to govern intensity when exercising independently;Short Term: Able to use daily as guideline for intensity in rehab       Understanding of Exercise Prescription Yes       Intervention Provide education, explanation, and written materials on patient's individual exercise prescription       Expected Outcomes Short Term: Able to explain program exercise prescription;Long Term: Able to explain home exercise prescription to exercise independently                Exercise Goals Re-Evaluation :  Exercise Goals Re-Evaluation     Row Name 04/11/22 0834             Exercise Goal Re-Evaluation   Exercise Goals Review Increase Physical Activity;Understanding of Exercise Prescription;Increase Strength and Stamina;Knowledge and understanding of Target Heart Rate Range (THRR);Able to understand and use rate of perceived exertion (RPE) scale       Comments Pt first day in CRP2 program. Pt tolerated exercise  well with an average MET level of 2.09. Pt is learning her THRR, ExRx, and RPE scale. Pt is off to a great start, really enjoys the nustep and said it makes her knees feel better.       Expected Outcomes Will continue to monitor and progress workloads as tolerated without s/sx.                Discharge Exercise Prescription (Final Exercise Prescription Changes):  Exercise Prescription Changes - 04/11/22 0800       Response to Exercise   Blood Pressure (Admit) 132/80    Blood Pressure (Exercise) 130/90    Blood Pressure (Exit) 114/86    Heart Rate (Admit) 90 bpm    Heart Rate (Exercise) 117 bpm    Heart Rate (Exit) 99 bpm    Rating of Perceived Exertion (Exercise) 13    Perceived Dyspnea (Exercise) 0    Symptoms none    Comments Pt first day in CRP2 program    Duration Continue with 30 min of aerobic exercise without signs/symptoms of physical distress.    Intensity THRR unchanged      Progression   Progression Continue to progress workloads to maintain intensity without signs/symptoms of physical distress.    Average METs 2.09      Resistance Training   Training Prescription Yes    Weight 2    Reps 10-15      NuStep   Level 1    SPM 81    Minutes 15    METs 1.9      Track   Laps 10    Minutes 15    METs 2.28             Nutrition:  Target Goals: Understanding of nutrition guidelines, daily intake of sodium 1500mg , cholesterol <  200mg , calories 30% from fat and 7% or less from saturated fats, daily to have 5 or more servings of fruits and vegetables.  Biometrics:  Pre Biometrics - 04/06/22 0831       Pre Biometrics   Waist Circumference 43 inches    Hip Circumference 49.5 inches    Waist to Hip Ratio 0.87 %    Triceps Skinfold 45 mm    % Body Fat 39.4 %    Grip Strength 24 kg    Flexibility 16 in    Single Leg Stand 3 seconds              Nutrition Therapy Plan and Nutrition Goals:  Nutrition Therapy & Goals - 04/11/22 0821        Nutrition Therapy   Diet Heart Healthy Diet      Personal Nutrition Goals   Nutrition Goal Patient to identify strategies for reducing cardiovascular risk by attending the Pritikin education and nutrition series weekly.    Personal Goal #2 Patient to improve diet quality by using the plate method as a guide for meal planning to include lean protein/plant protein, fruits, vegetables, whole grains, nonfat dairy as part of a well-balanced diet.    Personal Goal #3 Patient to identify strategies for weight loss of 0.5-2.0# per week.    Personal Goal #4 Patient to limit sodium to 1500mg  per day.    Comments Patient has previously seen outside RD on 09/03/21, 12/03/21 related to morbid obesity. She is motivated to reduce sodium intake and increase fruit and vegetable intake. Patient will benefit from participation in intensive cardiac rehab for nutrition, exercise, and lifestyle modification.      Intervention Plan   Intervention Prescribe, educate and counsel regarding individualized specific dietary modifications aiming towards targeted core components such as weight, hypertension, lipid management, diabetes, heart failure and other comorbidities.;Nutrition handout(s) given to patient.    Expected Outcomes Short Term Goal: Understand basic principles of dietary content, such as calories, fat, sodium, cholesterol and nutrients.;Long Term Goal: Adherence to prescribed nutrition plan.             Nutrition Assessments:  Nutrition Assessments - 04/11/22 0833       Rate Your Plate Scores   Pre Score 62            MEDIFICTS Score Key: ?70 Need to make dietary changes  40-70 Heart Healthy Diet ? 40 Therapeutic Level Cholesterol Diet   Flowsheet Row CARDIAC REHAB PHASE II EXERCISE from 04/11/2022 in [facility] for Heart, Vascular, & Lung Health  Picture Your Plate Total Score on Admission 62      Picture Your Plate Scores: [id number] Unhealthy dietary pattern with much  room for improvement. 41-50 Dietary pattern unlikely to meet recommendations for good health and room for improvement. 51-60 More healthful dietary pattern, with some room for improvement.  >60 Healthy dietary pattern, although there may be some specific behaviors that could be improved.    Nutrition Goals Re-Evaluation:  Nutrition Goals Re-Evaluation     [place] Name 04/11/22 0821             Goals   Current Weight 220 lb 7.4 oz (100 kg)       Comment A1c WNL       Expected Outcome Patient has previously seen outside RD on 09/03/21, 12/03/21 related to morbid obesity. She is motivated to reduce sodium intake and increase fruit and vegetable intake. Patient will benefit from participation in intensive  cardiac rehab for nutrition, exercise, and lifestyle modification.                Nutrition Goals Re-Evaluation:  Nutrition Goals Re-Evaluation     [place] Name 04/11/22 0821             Goals   Current Weight 220 lb 7.4 oz (100 kg)       Comment A1c WNL       Expected Outcome Patient has previously seen outside RD on 09/03/21, 12/03/21 related to morbid obesity. She is motivated to reduce sodium intake and increase fruit and vegetable intake. Patient will benefit from participation in intensive cardiac rehab for nutrition, exercise, and lifestyle modification.                Nutrition Goals Discharge (Final Nutrition Goals Re-Evaluation):  Nutrition Goals Re-Evaluation - 04/11/22 [id number]       Goals   Current Weight 220 lb 7.4 oz (100 kg)    Comment A1c WNL    Expected Outcome Patient has previously seen outside RD on 09/03/21, 12/03/21 related to morbid obesity. She is motivated to reduce sodium intake and increase fruit and vegetable intake. Patient will benefit from participation in intensive cardiac rehab for nutrition, exercise, and lifestyle modification.             Psychosocial: Target Goals: Acknowledge presence or absence of significant depression and/or  stress, maximize coping skills, provide positive support system. Participant is able to verbalize types and ability to use techniques and skills needed for reducing stress and depression.  Initial Review & Psychosocial Screening:  Initial Psych Review & Screening - 04/06/22 1051       Initial Review   Current issues with None Identified      Family Dynamics   Good Support System? Yes   Ethelda has her mother, sister, and children for support     Barriers   Psychosocial barriers to participate in program There are no identifiable barriers or psychosocial needs.      Screening Interventions   Interventions Encouraged to exercise;Provide feedback about the scores to participant    Expected Outcomes Short Term goal: Identification and review with participant of any Quality of Life or Depression concerns found by scoring the questionnaire.;Long Term goal: The participant improves quality of Life and PHQ9 Scores as seen by post scores and/or verbalization of changes             Quality of Life Scores:  Quality of Life - 04/06/22 1155       Quality of Life   Select Quality of Life      Quality of Life Scores   Health/Function Pre 17.97 %    Socioeconomic Pre 16.56 %    Psych/Spiritual Pre 17.79 %    Family Pre 20.3 %    GLOBAL Pre 17.94 %            Scores of 19 and below usually indicate a poorer quality of life in these areas.  A difference of  2-3 points is a clinically meaningful difference.  A difference of 2-3 points in the total score of the Quality of Life Index has been associated with significant improvement in overall quality of life, self-image, physical symptoms, and general health in studies assessing change in quality of life.  PHQ-9: Review Flowsheet       04/06/2022 02/10/2022 02/04/2021  Depression screen PHQ 2/9  Decreased Interest 0 0 0  Down, Depressed, Hopeless 0 0 1  PHQ - 2 Score 0 0 1  Altered sleeping 0 0 1  Tired, decreased energy 0 0 1   Change in appetite 0 0 0  Feeling bad or failure about yourself  0 0 0  Trouble concentrating 0 0 0  Moving slowly or fidgety/restless 0 0 0  Suicidal thoughts 0 0 0  PHQ-9 Score 0 0 3  Difficult doing work/chores - - Not difficult at all   Interpretation of Total Score  Total Score Depression Severity:  1-4 = Minimal depression, 5-9 = Mild depression, 10-14 = Moderate depression, 15-19 = Moderately severe depression, 20-27 = Severe depression   Psychosocial Evaluation and Intervention:   Psychosocial Re-Evaluation:  Psychosocial Re-Evaluation     Row Name 04/11/22 1711             Psychosocial Re-Evaluation   Current issues with None Identified       Interventions Encouraged to attend Cardiac Rehabilitation for the exercise       Continue Psychosocial Services  No Follow up required                Psychosocial Discharge (Final Psychosocial Re-Evaluation):  Psychosocial Re-Evaluation - 04/11/22 1711       Psychosocial Re-Evaluation   Current issues with None Identified    Interventions Encouraged to attend Cardiac Rehabilitation for the exercise    Continue Psychosocial Services  No Follow up required             Vocational Rehabilitation: Provide vocational rehab assistance to qualifying candidates.   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 Participant will state understanding/return demonstration of topics presented.     Core Videos: Exercise    Move It!   Clinical staff conducted group or individual video education with verbal and written material and guidebook.  Patient learns the recommended Pritikin exercise program. Exercise with the goal of living a long, healthy life. Some of the health benefits of exercise include controlled diabetes, healthier blood pressure levels, improved cholesterol levels, improved heart and lung capacity, improved sleep, and better body composition. Everyone should speak with their doctor before starting or changing an exercise routine.  Biomechanical Limitations Clinical staff conducted group or individual video education with verbal and written material and guidebook.  Patient learns how biomechanical limitations can impact exercise and how we can mitigate and possibly overcome limitations to have an impactful and balanced exercise routine.  Body Composition Clinical staff conducted group or individual video education with verbal and written material and guidebook.  Patient learns that body composition (ratio of muscle mass to fat mass) is a key component to assessing overall fitness, rather than body weight alone. Increased fat mass, especially visceral belly fat, can put [place] at increased risk for metabolic syndrome, type 2 diabetes, heart disease, and even death. It is recommended to combine diet and exercise (cardiovascular and resistance training) to improve your body composition. Seek guidance from your physician and exercise physiologist before implementing an exercise  routine.  Exercise Action Plan Clinical staff conducted group or individual video education with verbal and written material and guidebook.  Patient learns the recommended strategies to achieve and enjoy long-term exercise adherence, including variety, self-motivation, self-efficacy, and positive decision making. Benefits of exercise include fitness, good health, weight management, more energy, better sleep, less stress, and overall  well-being.  Medical   Heart Disease Risk Reduction Clinical staff conducted group or individual video education with verbal and written material and guidebook.  Patient learns our heart is our most vital organ as it circulates oxygen, nutrients, white blood cells, and hormones throughout the entire body, and carries waste away. Data supports a plant-based eating plan like the Pritikin Program for its effectiveness in slowing progression of and reversing heart disease. The video provides a number of recommendations to address heart disease.   Metabolic Syndrome and Belly Fat  Clinical staff conducted group or individual video education with verbal and written material and guidebook.  Patient learns what metabolic syndrome is, how it leads to heart disease, and how one can reverse it and keep it from coming back. You have metabolic syndrome if you have 3 of the following 5 criteria: abdominal obesity, high blood pressure, high triglycerides, low HDL cholesterol, and high blood sugar.  Hypertension and Heart Disease Clinical staff conducted group or individual video education with verbal and written material and guidebook.  Patient learns that high blood pressure, or hypertension, is very common in the [place]. Hypertension is largely due to excessive salt intake, but other important risk factors include being overweight, physical inactivity, drinking too much alcohol, smoking, and not eating enough potassium from fruits and vegetables. High blood pressure is a leading risk factor for heart attack, stroke, congestive heart failure, dementia, kidney failure, and premature death. Long-term effects of excessive salt intake include stiffening of the arteries and thickening of heart muscle and organ damage. Recommendations include ways to reduce hypertension and the risk of heart disease.  Diseases of Our Time - Focusing on Diabetes Clinical staff conducted group or individual video education with  verbal and written material and guidebook.  Patient learns why the best way to stop diseases of our time is prevention, through food and other lifestyle changes. Medicine (such as prescription pills and surgeries) is often only a Band-Aid on the problem, not a long-term solution. Most common diseases of our time include obesity, type 2 diabetes, hypertension, heart disease, and cancer. The Pritikin Program is recommended and has been proven to help reduce, reverse, and/or prevent the damaging effects of metabolic syndrome.  Nutrition   Overview of the Pritikin Eating Plan  Clinical staff conducted group or individual video education with verbal and written material and guidebook.  Patient learns about the [place] for disease risk reduction. The [place] emphasizes a wide variety of unrefined, minimally-processed carbohydrates, like fruits, vegetables, whole grains, and legumes. Go, Caution, and Stop food choices are explained. Plant-based and lean animal proteins are emphasized. Rationale provided for low sodium intake for blood pressure control, low added sugars for blood sugar stabilization, and low added fats and oils for coronary artery disease risk reduction and weight management.  Calorie Density  Clinical staff conducted group or individual video education with verbal and written material and guidebook.  Patient learns about calorie density and how it impacts the Pritikin Eating Plan. Knowing the characteristics of the food you choose will help you decide whether those foods will lead to weight gain or weight loss,  and whether you want to consume more or less of them. Weight loss is usually a side effect of the Pritikin Eating Plan because of its focus on low calorie-dense foods.  Label Reading  Clinical staff conducted group or individual video education with verbal and written material and guidebook.  Patient learns about the Pritikin recommended label reading  guidelines and corresponding recommendations regarding calorie density, added sugars, sodium content, and whole grains.  Dining Out - Part 1  Clinical staff conducted group or individual video education with verbal and written material and guidebook.  Patient learns that restaurant meals can be sabotaging because they can be so high in calories, fat, sodium, and/or sugar. Patient learns recommended strategies on how to positively address this and avoid unhealthy pitfalls.  Facts on Fats  Clinical staff conducted group or individual video education with verbal and written material and guidebook.  Patient learns that lifestyle modifications can be just as effective, if not more so, as many medications for lowering your risk of heart disease. A Pritikin lifestyle can help to reduce your risk of inflammation and atherosclerosis (cholesterol build-up, or plaque, in the artery walls). Lifestyle interventions such as dietary choices and physical activity address the cause of atherosclerosis. A review of the types of fats and their impact on blood cholesterol levels, along with dietary recommendations to reduce fat intake is also included.  Nutrition Action Plan  Clinical staff conducted group or individual video education with verbal and written material and guidebook.  Patient learns how to incorporate Pritikin recommendations into their lifestyle. Recommendations include planning and keeping personal health goals in mind as an important part of their success.  Healthy Mind-Set    Healthy Minds, Bodies, Hearts  Clinical staff conducted group or individual video education with verbal and written material and guidebook.  Patient learns how to identify when they are stressed. Video will discuss the impact of that stress, as well as the many benefits of stress management. Patient will also be introduced to stress management techniques. The way we think, act, and feel has an impact on our hearts.  How Our  Thoughts Can Heal Our Hearts  Clinical staff conducted group or individual video education with verbal and written material and guidebook.  Patient learns that negative thoughts can cause depression and anxiety. This can result in negative lifestyle behavior and serious health problems. Cognitive behavioral therapy is an effective method to help control our thoughts in order to change and improve our emotional outlook.  Additional Videos:  Exercise    Improving Performance  Clinical staff conducted group or individual video education with verbal and written material and guidebook.  Patient learns to use a non-linear approach by alternating intensity levels and lengths of time spent exercising to help burn more calories and lose more body fat. Cardiovascular exercise helps improve heart health, metabolism, hormonal balance, blood sugar control, and recovery from fatigue. Resistance training improves strength, endurance, balance, coordination, reaction time, metabolism, and muscle mass. Flexibility exercise improves circulation, posture, and balance. Seek guidance from your physician and exercise physiologist before implementing an exercise routine and learn your capabilities and proper form for all exercise.  Introduction to Yoga  Clinical staff conducted group or individual video education with verbal and written material and guidebook.  Patient learns about yoga, a discipline of the coming together of mind, breath, and body. The benefits of yoga include improved flexibility, improved range of motion, better posture and core strength, increased lung function, weight loss, and positive self-image.  Yoga's heart health benefits include lowered blood pressure, healthier heart rate, decreased cholesterol and triglyceride levels, improved immune function, and reduced stress. Seek guidance from your physician and exercise physiologist before implementing an exercise routine and learn your capabilities and  proper form for all exercise.  Medical   Aging: Enhancing Your Quality of Life  Clinical staff conducted group or individual video education with verbal and written material and guidebook.  Patient learns key strategies and recommendations to stay in good physical health and enhance quality of life, such as prevention strategies, having an advocate, securing a [place], and keeping a list of medications and system for tracking them. It also discusses how to avoid risk for bone loss.  Biology of Weight Control  Clinical staff conducted group or individual video education with verbal and written material and guidebook.  Patient learns that weight gain occurs because we consume more calories than we burn (eating more, moving less). Even if your body weight is normal, you may have higher ratios of fat compared to muscle mass. Too much body fat puts you at increased risk for cardiovascular disease, heart attack, stroke, type 2 diabetes, and obesity-related cancers. In addition to exercise, following the [place] can help reduce your risk.  Decoding Lab Results  Clinical staff conducted group or individual video education with verbal and written material and guidebook.  Patient learns that lab test reflects one measurement whose values change over time and are influenced by many factors, including medication, stress, sleep, exercise, food, hydration, pre-existing medical conditions, and more. It is recommended to use the knowledge from this video to become more involved with your lab results and evaluate your numbers to speak with your doctor.   Diseases of Our Time - Overview  Clinical staff conducted group or individual video education with verbal and written material and guidebook.  Patient learns that according to the CDC, 50% to 70% of chronic diseases (such as obesity, type 2 diabetes, elevated lipids, hypertension, and heart disease) are avoidable through  lifestyle improvements including healthier food choices, listening to satiety cues, and increased physical activity.  Sleep Disorders Clinical staff conducted group or individual video education with verbal and written material and guidebook.  Patient learns how good quality and duration of sleep are important to overall health and well-being. Patient also learns about sleep disorders and how they impact health along with recommendations to address them, including discussing with a physician.  Nutrition  Dining Out - Part 2 Clinical staff conducted group or individual video education with verbal and written material and guidebook.  Patient learns how to plan ahead and communicate in order to maximize their dining experience in a healthy and nutritious manner. Included are recommended food choices based on the type of restaurant the patient is visiting.   Fueling a Best boy conducted group or individual video education with verbal and written material and guidebook.  There is a strong connection between our food choices and our health. Diseases like obesity and type 2 diabetes are very prevalent and are in large-part due to lifestyle choices. The Pritikin Eating Plan provides plenty of food and hunger-curbing satisfaction. It is easy to follow, affordable, and helps reduce health risks.  Menu Workshop  Clinical staff conducted group or individual video education with verbal and written material and guidebook.  Patient learns that restaurant meals can sabotage health goals because they are often packed with calories, fat, sodium, and sugar. Recommendations  include strategies to plan ahead and to communicate with the manager, chef, or server to help order a healthier meal.  Planning Your Eating Strategy  Clinical staff conducted group or individual video education with verbal and written material and guidebook.  Patient learns about the [place] and its benefit of  reducing the risk of disease. The [place] does not focus on calories. Instead, it emphasizes high-quality, nutrient-rich foods. By knowing the characteristics of the foods, we choose, we can determine their calorie density and make informed decisions.  Targeting Your Nutrition Priorities  Clinical staff conducted group or individual video education with verbal and written material and guidebook.  Patient learns that lifestyle habits have a tremendous impact on disease risk and progression. This video provides eating and physical activity recommendations based on your personal health goals, such as reducing LDL cholesterol, losing weight, preventing or controlling type 2 diabetes, and reducing high blood pressure.  Vitamins and Minerals  Clinical staff conducted group or individual video education with verbal and written material and guidebook.  Patient learns different ways to obtain key vitamins and minerals, including through a recommended healthy diet. It is important to discuss all supplements you take with your doctor.   Healthy Mind-Set    Smoking Cessation  Clinical staff conducted group or individual video education with verbal and written material and guidebook.  Patient learns that cigarette smoking and tobacco addiction pose a serious health risk which affects millions of people. Stopping smoking will significantly reduce the risk of heart disease, lung disease, and many forms of cancer. Recommended strategies for quitting are covered, including working with your doctor to develop a successful plan.  Culinary   Becoming a Financial trader conducted group or individual video education with verbal and written material and guidebook.  Patient learns that cooking at home can be healthy, cost-effective, quick, and puts them in control. Keys to cooking healthy recipes will include looking at your recipe, assessing your equipment needs, planning ahead, making it  simple, choosing cost-effective seasonal ingredients, and limiting the use of added fats, salts, and sugars.  Cooking - Breakfast and Snacks  Clinical staff conducted group or individual video education with verbal and written material and guidebook.  Patient learns how important breakfast is to satiety and nutrition through the entire day. Recommendations include key foods to eat during breakfast to help stabilize blood sugar levels and to prevent overeating at meals later in the day. Planning ahead is also a key component.  Cooking - Human resources officer conducted group or individual video education with verbal and written material and guidebook.  Patient learns eating strategies to improve overall health, including an approach to cook more at home. Recommendations include thinking of animal protein as a side on your plate rather than center stage and focusing instead on lower calorie dense options like vegetables, fruits, whole grains, and plant-based proteins, such as beans. Making sauces in large quantities to freeze for later and leaving the skin on your vegetables are also recommended to maximize your experience.  Cooking - Healthy Salads and Dressing Clinical staff conducted group or individual video education with verbal and written material and guidebook.  Patient learns that vegetables, fruits, whole grains, and legumes are the foundations of the [place]. Recommendations include how to incorporate each of these in flavorful and healthy salads, and how to create homemade salad dressings. Proper handling of ingredients is also covered. Cooking - Soups and Desserts  Cooking - Soups and Desserts Clinical staff conducted group or individual video education with verbal and written material and guidebook.  Patient learns that Pritikin soups and desserts make for easy, nutritious, and delicious snacks and meal components that are low in sodium, fat, sugar, and calorie  density, while high in vitamins, minerals, and filling fiber. Recommendations include simple and healthy ideas for soups and desserts.   Overview     The Pritikin Solution Program Overview Clinical staff conducted group or individual video education with verbal and written material and guidebook.  Patient learns that the results of the [place] Program have been documented in more than 100 articles published in peer-reviewed journals, and the benefits include reducing risk factors for (and, in some cases, even reversing) high cholesterol, high blood pressure, type 2 diabetes, obesity, and more! An overview of the three key pillars of the Pritikin Program will be covered: eating well, doing regular exercise, and having a healthy mind-set.  WORKSHOPS  Exercise: Exercise Basics: Building Your Action Plan Clinical staff led group instruction and group discussion with PowerPoint presentation and patient guidebook. To enhance the learning environment the use of posters, models and videos may be added. At the conclusion of this workshop, patients will comprehend the difference between physical activity and exercise, as well as the benefits of incorporating both, into their routine. Patients will understand the FITT (Frequency, Intensity, Time, and Type) principle and how to use it to build an exercise action plan. In addition, safety concerns and other considerations for exercise and cardiac rehab will be addressed by the presenter. The purpose of this lesson is to promote a comprehensive and effective weekly exercise routine in order to improve patients' overall level of fitness.   Managing Heart Disease: Your Path to a Healthier Heart Clinical staff led group instruction and group discussion with PowerPoint presentation and patient guidebook. To enhance the learning environment the use of posters, models and videos may be added.At the conclusion of this workshop, patients will understand the  anatomy and physiology of the heart. Additionally, they will understand how Pritikin's three pillars impact the risk factors, the progression, and the management of heart disease.  The purpose of this lesson is to provide a high-level overview of the heart, heart disease, and how the Pritikin lifestyle positively impacts risk factors.  Exercise Biomechanics Clinical staff led group instruction and group discussion with PowerPoint presentation and patient guidebook. To enhance the learning environment the use of posters, models and videos may be added. Patients will learn how the structural parts of their bodies function and how these functions impact their daily activities, movement, and exercise. Patients will learn how to promote a neutral spine, learn how to manage pain, and identify ways to improve their physical movement in order to promote healthy living. The purpose of this lesson is to expose patients to common physical limitations that impact physical activity. Participants will learn practical ways to adapt and manage aches and pains, and to minimize their effect on regular exercise. Patients will learn how to maintain good posture while sitting, walking, and lifting.  Balance Training and Fall Prevention  Clinical staff led group instruction and group discussion with PowerPoint presentation and patient guidebook. To enhance the learning environment the use of posters, models and videos may be added. At the conclusion of this workshop, patients will understand the importance of their sensorimotor skills (vision, proprioception, and the vestibular system) in maintaining their ability to balance as they age. Patients will apply a variety  of balancing exercises that are appropriate for their current level of function. Patients will understand the common causes for poor balance, possible solutions to these problems, and ways to modify their physical environment in order to minimize their  fall risk. The purpose of this lesson is to teach patients about the importance of maintaining balance as they age and ways to minimize their risk of falling.  WORKSHOPS   Nutrition:  Fueling a Scientist, research (physical sciences) led group instruction and group discussion with PowerPoint presentation and patient guidebook. To enhance the learning environment the use of posters, models and videos may be added. Patients will review the foundational principles of the [place] and understand what constitutes a serving size in each of the food groups. Patients will also learn Pritikin-friendly foods that are better choices when away from home and review make-ahead meal and snack options. Calorie density will be reviewed and applied to three nutrition priorities: weight maintenance, weight loss, and weight gain. The purpose of this lesson is to reinforce (in a group setting) the key concepts around what patients are recommended to eat and how to apply these guidelines when away from home by planning and selecting Pritikin-friendly options. Patients will understand how calorie density may be adjusted for different weight management goals.  Mindful Eating  Clinical staff led group instruction and group discussion with PowerPoint presentation and patient guidebook. To enhance the learning environment the use of posters, models and videos may be added. Patients will briefly review the concepts of the [place] and the importance of low-calorie dense foods. The concept of mindful eating will be introduced as well as the importance of paying attention to internal hunger signals. Triggers for non-hunger eating and techniques for dealing with triggers will be explored. The purpose of this lesson is to provide patients with the opportunity to review the basic principles of the [place], discuss the value of eating mindfully and how to measure internal cues of hunger and fullness using the  Hunger Scale. Patients will also discuss reasons for non-hunger eating and learn strategies to use for controlling emotional eating.  Targeting Your Nutrition Priorities Clinical staff led group instruction and group discussion with PowerPoint presentation and patient guidebook. To enhance the learning environment the use of posters, models and videos may be added. Patients will learn how to determine their genetic susceptibility to disease by reviewing their family history. Patients will gain insight into the importance of diet as part of an overall healthy lifestyle in mitigating the impact of genetics and other environmental insults. The purpose of this lesson is to provide patients with the opportunity to assess their personal nutrition priorities by looking at their family history, their own health history and current risk factors. Patients will also be able to discuss ways of prioritizing and modifying the [place] for their highest risk areas  Menu  Clinical staff led group instruction and group discussion with PowerPoint presentation and patient guidebook. To enhance the learning environment the use of posters, models and videos may be added. Using menus brought in from [employer], or printed from [employer], patients will apply the [place] dining out guidelines that were presented in the [employer] video. Patients will also be able to practice these guidelines in a variety of provided scenarios. The purpose of this lesson is to provide patients with the opportunity to practice hands-on learning of the [place] with actual menus and practice scenarios.  Label Reading Clinical staff led group instruction and group discussion with PowerPoint presentation and patient guidebook. To enhance the learning environment the use of posters, models and videos may be added. Patients will review and discuss the Pritikin label reading guidelines  presented in Pritikin's Label Reading Educational series video. Using fool labels brought in from local grocery stores and markets, patients will apply the label reading guidelines and determine if the packaged food meet the Pritikin guidelines. The purpose of this lesson is to provide patients with the opportunity to review, discuss, and practice hands-on learning of the Pritikin Label Reading guidelines with actual packaged food labels. [place] Workshops are designed to teach patients ways to prepare quick, simple, and affordable recipes at home. The importance of nutrition's role in chronic disease risk reduction is reflected in its emphasis in the overall Pritikin program. By learning how to prepare essential core Pritikin Eating Plan recipes, patients will increase control over what they eat; be able to customize the flavor of foods without the use of added salt, sugar, or fat; and improve the quality of the food they consume. By learning a set of core recipes which are easily assembled, quickly prepared, and affordable, patients are more likely to prepare more healthy foods at home. These workshops focus on convenient breakfasts, simple entres, side dishes, and desserts which can be prepared with minimal effort and are consistent with nutrition recommendations for cardiovascular risk reduction. Cooking [employer] are taught by a Engineer, materials (RD) who has been trained by the [employer]. The chef or RD has a clear understanding of the importance of minimizing - if not completely eliminating - added fat, sugar, and sodium in recipes. Throughout the series of [place] Workshop sessions, patients will learn about healthy ingredients and efficient methods of cooking to build confidence in their capability to prepare    Cooking School weekly topics:  Adding Flavor- Sodium-Free  Fast and Healthy Breakfasts  Powerhouse Plant-Based  Proteins  Satisfying Salads and Dressings  Simple Sides and Sauces  International Cuisine-Spotlight on the [place] Zones  Delicious Desserts  Savory Soups  [employer] - Meals in a Agricultural consultant Appetizers and Snacks  Comforting Weekend Breakfasts  One-Pot Wonders   Fast Evening Meals  Contractor Your Pritikin Plate  WORKSHOPS   Healthy Mindset (Psychosocial):  Focused Goals, Sustainable Changes Clinical staff led group instruction and group discussion with PowerPoint presentation and patient guidebook. To enhance the learning environment the use of posters, models and videos may be added. Patients will be able to apply effective goal setting strategies to establish at least one personal goal, and then take consistent, meaningful action toward that goal. They will learn to identify common barriers to achieving personal goals and develop strategies to overcome them. Patients will also gain an understanding of how our mind-set can impact our ability to achieve goals and the importance of cultivating a positive and growth-oriented mind-set. The purpose of this lesson is to provide patients with a deeper understanding of how to set and achieve personal goals, as well as the tools and strategies needed to overcome common obstacles which may arise along the way.  From Head to Heart: The Power of a Healthy Outlook  Clinical staff led group instruction and group discussion with PowerPoint presentation and patient guidebook. To enhance the learning environment the use of posters, models and videos may be added. Patients will be able to recognize and  describe the impact of emotions and mood on physical health. They will discover the importance of self-care and explore self-care practices which may work for them. Patients will also learn how to utilize the 4 C's to cultivate a healthier outlook and better manage stress and challenges. The purpose of this lesson is to demonstrate  to patients how a healthy outlook is an essential part of maintaining good health, especially as they continue their cardiac rehab journey.  Healthy Sleep for a Healthy Heart Clinical staff led group instruction and group discussion with PowerPoint presentation and patient guidebook. To enhance the learning environment the use of posters, models and videos may be added. At the conclusion of this workshop, patients will be able to demonstrate knowledge of the importance of sleep to overall health, well-being, and quality of life. They will understand the symptoms of, and treatments for, common sleep disorders. Patients will also be able to identify daytime and nighttime behaviors which impact sleep, and they will be able to apply these tools to help manage sleep-related challenges. The purpose of this lesson is to provide patients with a general overview of sleep and outline the importance of quality sleep. Patients will learn about a few of the most common sleep disorders. Patients will also be introduced to the concept of "sleep hygiene," and discover ways to self-manage certain sleeping problems through simple daily behavior changes. Finally, the workshop will motivate patients by clarifying the links between quality sleep and their goals of heart-healthy living.   Recognizing and Reducing Stress Clinical staff led group instruction and group discussion with PowerPoint presentation and patient guidebook. To enhance the learning environment the use of posters, models and videos may be added. At the conclusion of this workshop, patients will be able to understand the types of stress reactions, differentiate between acute and chronic stress, and recognize the impact that chronic stress has on their health. They will also be able to apply different coping mechanisms, such as reframing negative self-talk. Patients will have the opportunity to practice a variety of stress management techniques, such as deep  abdominal breathing, progressive muscle relaxation, and/or guided imagery.  The purpose of this lesson is to educate patients on the role of stress in their lives and to provide healthy techniques for coping with it.  Learning Barriers/Preferences:  Learning Barriers/Preferences - 04/06/22 1158       Learning Barriers/Preferences   Learning Barriers Sight   wears glasses   Learning Preferences Audio;Computer/Internet;Group Instruction;Individual Instruction;Skilled Demonstration;Pictoral;Verbal Instruction;Video;Written Material             Education Topics:  Knowledge Questionnaire Score:  Knowledge Questionnaire Score - 04/06/22 1151       Knowledge Questionnaire Score   Pre Score 18/24             Core Components/Risk Factors/Patient Goals at Admission:  Personal Goals and Risk Factors at Admission - 04/06/22 1152       Core Components/Risk Factors/Patient Goals on Admission    Weight Management Yes;Obesity;Weight Loss    Intervention Weight Management: Provide education and appropriate resources to help participant work on and attain dietary goals.;Weight Management: Develop a combined nutrition and exercise program designed to reach desired caloric intake, while maintaining appropriate intake of nutrient and fiber, sodium and fats, and appropriate energy expenditure required for the weight goal.;Weight Management/Obesity: Establish reasonable short term and long term weight goals.;Obesity: Provide education and appropriate resources to help participant work on and attain dietary goals.    Admit Weight 225  lb (102.1 kg)    Expected Outcomes Short Term: Continue to assess and modify interventions until short term weight is achieved;Long Term: Adherence to nutrition and physical activity/exercise program aimed toward attainment of established weight goal;Weight Loss: Understanding of general recommendations for a balanced deficit meal plan, which promotes 1-2 lb weight loss  per week and includes a negative energy balance of [phone] kcal/d;Understanding recommendations for meals to include 15-35% energy as protein, 25-35% energy from fat, 35-60% energy from carbohydrates, less than 200mg  of dietary cholesterol, 20-35 gm of total fiber daily;Understanding of distribution of calorie intake throughout the day with the consumption of 4-5 meals/snacks    Hypertension Yes    Intervention Provide education on lifestyle modifcations including regular physical activity/exercise, weight management, moderate sodium restriction and increased consumption of fresh fruit, vegetables, and low fat dairy, alcohol moderation, and smoking cessation.;Monitor prescription use compliance.    Expected Outcomes Short Term: Continued assessment and intervention until BP is < 140/57mm HG in hypertensive participants. < 130/31mm HG in hypertensive participants with diabetes, heart failure or chronic kidney disease.;Long Term: Maintenance of blood pressure at goal levels.             Core Components/Risk Factors/Patient Goals Review:   Goals and Risk Factor Review     Row Name 04/11/22 1713             Core Components/Risk Factors/Patient Goals Review   Personal Goals Review Weight Management/Obesity;Hypertension       Review Nanami started intensive cardiac rehab on 04/11/22. Stehanie did well with exercise for her fitness level. Will continue to monitor bP       Expected Outcomes [place] will continue to participate in intensive cardiac rehab for exercise, nutrition and lifestyle modifications                Core Components/Risk Factors/Patient Goals at Discharge (Final Review):   Goals and Risk Factor Review - 04/11/22 1713       Core Components/Risk Factors/Patient Goals Review   Personal Goals Review Weight Management/Obesity;Hypertension    Review Arpita started intensive cardiac rehab on 04/11/22. Maryjo did well with exercise for her fitness level. Will continue  to monitor bP    Expected Outcomes [place]qu[place] will continue to participate in intensive cardiac rehab for exercise, nutrition and lifestyle modifications             ITP Comments:  ITP Comments     Row Name 04/06/22 0832 04/11/22 1711         ITP Comments Dr. Fransico Him medical director. Introduction to pritikin education program/ intesive cardiac rehab. Initial orientation packet reviewed with patient. 30 Day ITP Review. Hadlea started intensive cardiac rehab on 03/1822/ and did well with exercise               Comments: See ITP comments.Harrell Gave RN BSN

## 2022-04-13 ENCOUNTER — Encounter (HOSPITAL_COMMUNITY)
Admission: RE | Admit: 2022-04-13 | Discharge: 2022-04-13 | Disposition: A | Discharge status: Home / Self Care | Payer: Commercial Managed Care - HMO | Source: Ambulatory Visit | Attending: Cardiovascular Disease | Admitting: Cardiovascular Disease

## 2022-04-13 DIAGNOSIS — Z9889 Other specified postprocedural states: Secondary | ICD-10-CM

## 2022-04-13 NOTE — Progress Notes (Signed)
QUALITY OF LIFE SCORE REVIEW Patient completed Quality of Life survey as a participant in Cardiac Rehab.  Scores 19.0 or below are considered low.  Pt score very low in several areas Overall 17.94, Health and Function 17.97, socioeconomic 16.56, physiological and spiritual 17.79, family 20.30. Patient quality of life slightly altered by physical constraints which limits ability to perform as prior to recent cardiac illness.  Currently living with mother which returning to live with a parent after living on own has been an adjustment. 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  She shared she has a very strong bond with her children and misses them as both son and daughter are not living in the area. Her mother and her children are her support. Offered emotional support and reassurance.  Will continue to monitor and intervene as necessary.

## 2022-04-15 ENCOUNTER — Encounter (HOSPITAL_COMMUNITY)
Admission: RE | Admit: 2022-04-15 | Discharge: 2022-04-15 | Disposition: A | Discharge status: Home / Self Care | Payer: Commercial Managed Care - HMO | Source: Ambulatory Visit | Attending: Cardiovascular Disease | Admitting: Cardiovascular Disease

## 2022-04-15 DIAGNOSIS — Z9889 Other specified postprocedural states: Secondary | ICD-10-CM | POA: Diagnosis not present

## 2022-04-18 ENCOUNTER — Other Ambulatory Visit (HOSPITAL_COMMUNITY): Payer: Self-pay | Admitting: Cardiovascular Disease

## 2022-04-18 ENCOUNTER — Ambulatory Visit (HOSPITAL_COMMUNITY): Payer: Commercial Managed Care - HMO

## 2022-04-18 ENCOUNTER — Encounter (HOSPITAL_COMMUNITY)
Admission: RE | Admit: 2022-04-18 | Discharge: 2022-04-18 | Disposition: A | Discharge status: Home / Self Care | Payer: Commercial Managed Care - HMO | Source: Ambulatory Visit | Attending: Cardiovascular Disease | Admitting: Cardiovascular Disease

## 2022-04-18 DIAGNOSIS — R931 Abnormal findings on diagnostic imaging of heart and coronary circulation: Secondary | ICD-10-CM | POA: Insufficient documentation

## 2022-04-18 DIAGNOSIS — Z9889 Other specified postprocedural states: Secondary | ICD-10-CM

## 2022-04-18 LAB — ECHOCARDIOGRAM LIMITED: Area-P 1/2: 3.28 cm2

## 2022-04-18 MED ORDER — PERFLUTREN LIPID MICROSPHERE
1.0000 mL | INTRAVENOUS | Status: AC | PRN
  Administered 2022-04-18: 2 mL via INTRAVENOUS

## 2022-04-18 NOTE — Progress Notes (Signed)
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  Vocational rehab forms given to her for completion. Cherre Huger, BSN Cardiac and Training and development officer

## 2022-04-20 ENCOUNTER — Encounter (HOSPITAL_COMMUNITY)
Admission: RE | Admit: 2022-04-20 | Discharge: 2022-04-20 | Disposition: A | Discharge status: Home / Self Care | Payer: Commercial Managed Care - HMO | Source: Ambulatory Visit | Attending: Cardiovascular Disease | Admitting: Cardiovascular Disease

## 2022-04-20 DIAGNOSIS — Z9889 Other specified postprocedural states: Secondary | ICD-10-CM

## 2022-04-22 ENCOUNTER — Encounter (HOSPITAL_COMMUNITY)
Admission: RE | Admit: 2022-04-22 | Discharge: 2022-04-22 | Disposition: A | Discharge status: Home / Self Care | Payer: Commercial Managed Care - HMO | Source: Ambulatory Visit | Attending: Cardiovascular Disease | Admitting: Cardiovascular Disease

## 2022-04-22 DIAGNOSIS — Z9889 Other specified postprocedural states: Secondary | ICD-10-CM | POA: Diagnosis not present

## 2022-04-25 ENCOUNTER — Encounter (HOSPITAL_COMMUNITY)
Admission: RE | Admit: 2022-04-25 | Discharge: 2022-04-25 | Disposition: A | Discharge status: Home / Self Care | Payer: Medicaid Other | Source: Ambulatory Visit | Attending: Cardiovascular Disease | Admitting: Cardiovascular Disease

## 2022-04-25 DIAGNOSIS — Z9889 Other specified postprocedural states: Secondary | ICD-10-CM | POA: Diagnosis present

## 2022-04-25 DIAGNOSIS — Z48812 Encounter for surgical aftercare following surgery on the circulatory system: Secondary | ICD-10-CM | POA: Diagnosis not present

## 2022-04-27 ENCOUNTER — Encounter (HOSPITAL_COMMUNITY)
Admission: RE | Admit: 2022-04-27 | Discharge: 2022-04-27 | Disposition: A | Discharge status: Home / Self Care | Payer: Medicaid Other | Source: Ambulatory Visit | Attending: Cardiovascular Disease | Admitting: Cardiovascular Disease

## 2022-04-27 DIAGNOSIS — Z9889 Other specified postprocedural states: Secondary | ICD-10-CM | POA: Diagnosis not present

## 2022-04-27 NOTE — Progress Notes (Signed)
CARDIAC REHAB PHASE 2  Reviewed home exercise with pt today. Pt is tolerating exercise well. Pt will continue to exercise on her own by doing her leg mobility exercises twice daily and adding in chair fitness classes on youtube for 30-45 minutes per session 2-4 days a week in addition to the 3 days in [facility]. Advised pt on THRR, RPE scale, hydration and temperature/humidity precautions. Reinforced S/S to stop exercise and when to call MD vs 911. Encouraged warm up cool down and stretches with exercise sessions. Pt verbalized understanding, all questions were answered and pt was given a copy to take home.    Kirby Funk ACSM-CEP 04/27/2022 8:34 AM

## 2022-04-29 ENCOUNTER — Encounter (HOSPITAL_COMMUNITY)
Admission: RE | Admit: 2022-04-29 | Discharge: 2022-04-29 | Disposition: A | Discharge status: Home / Self Care | Payer: Medicaid Other | Source: Ambulatory Visit | Attending: Cardiovascular Disease | Admitting: Cardiovascular Disease

## 2022-04-29 DIAGNOSIS — Z9889 Other specified postprocedural states: Secondary | ICD-10-CM

## 2022-05-02 ENCOUNTER — Encounter (HOSPITAL_COMMUNITY): Payer: Medicaid Other

## 2022-05-02 ENCOUNTER — Encounter (HOSPITAL_COMMUNITY)
Admission: RE | Admit: 2022-05-02 | Discharge: 2022-05-02 | Disposition: A | Discharge status: Home / Self Care | Payer: Medicaid Other | Source: Ambulatory Visit | Attending: Cardiovascular Disease | Admitting: Cardiovascular Disease

## 2022-05-02 DIAGNOSIS — Z9889 Other specified postprocedural states: Secondary | ICD-10-CM | POA: Diagnosis not present

## 2022-05-04 ENCOUNTER — Encounter (HOSPITAL_COMMUNITY)
Admission: RE | Admit: 2022-05-04 | Discharge: 2022-05-04 | Disposition: A | Discharge status: Home / Self Care | Payer: Medicaid Other | Source: Ambulatory Visit | Attending: Cardiovascular Disease | Admitting: Cardiovascular Disease

## 2022-05-04 ENCOUNTER — Encounter (HOSPITAL_COMMUNITY): Payer: Medicaid Other

## 2022-05-04 DIAGNOSIS — Z9889 Other specified postprocedural states: Secondary | ICD-10-CM | POA: Diagnosis not present

## 2022-05-04 NOTE — Progress Notes (Signed)
Duyen shared this morning she had been experiencing heart burn since late last night which kept her awake until about 11pm.  She went out to eat yesterday evening and what she ate for dinner was unexpectedly spicy. She shared she does not eat spicy foods and on the rare occasion she does she does not tolerate it well. She emphasized "I know it is heart burn."  She otherwise felt fine exercising and she said drinking water helped. She reported no other symptoms other than an occasional burp which she said helped. VSS throughout the cardiac rehab session. At the end of the exercise session reported the activity had helped and she was experiencing very little of the indigestion.

## 2022-05-06 ENCOUNTER — Encounter (HOSPITAL_COMMUNITY)
Admission: RE | Admit: 2022-05-06 | Discharge: 2022-05-06 | Disposition: A | Discharge status: Home / Self Care | Payer: Medicaid Other | Source: Ambulatory Visit | Attending: Cardiovascular Disease | Admitting: Cardiovascular Disease

## 2022-05-06 ENCOUNTER — Encounter (HOSPITAL_COMMUNITY): Payer: Medicaid Other

## 2022-05-06 DIAGNOSIS — Z9889 Other specified postprocedural states: Secondary | ICD-10-CM | POA: Diagnosis not present

## 2022-05-09 ENCOUNTER — Encounter (HOSPITAL_COMMUNITY): Payer: Medicaid Other

## 2022-05-09 ENCOUNTER — Encounter (HOSPITAL_COMMUNITY)
Admission: RE | Admit: 2022-05-09 | Discharge: 2022-05-09 | Disposition: A | Discharge status: Home / Self Care | Payer: Medicaid Other | Source: Ambulatory Visit | Attending: Cardiovascular Disease | Admitting: Cardiovascular Disease

## 2022-05-09 DIAGNOSIS — Z9889 Other specified postprocedural states: Secondary | ICD-10-CM | POA: Diagnosis not present

## 2022-05-09 NOTE — Progress Notes (Signed)
Cardiac Individual Treatment Plan  Patient Details  Name: Melissa Riley MRN: [id number] Date of Birth: [birth year] Referring Provider:   Flowsheet Row CARDIAC REHAB PHASE II ORIENTATION from 04/06/2022 in [facility] for Heart, Vascular, & [place]  Referring Provider Eleonore Chiquito, MD       Initial Encounter Date:  Wood from 04/06/2022 in [facility], Vascular, & [facility]  Date 04/06/22       Visit Diagnosis: S/P mitral valve repair  Patient's Home Medications on Admission:  Current Outpatient Medications:    apixaban (ELIQUIS) 5 MG TABS tablet, Take 1 tablet (5 mg total) by mouth 2 (two) times daily., Disp: 60 tablet, Rfl: 2   Ascorbic Acid (VITAMIN C PO), Take 1 tablet by mouth daily., Disp: , Rfl:    aspirin EC 81 MG tablet, Take 81 mg by mouth daily. Swallow whole., Disp: , Rfl:    Cholecalciferol (VITAMIN D3) 25 MCG (1000 UT) CHEW, Chew by mouth daily. 2 gummies daily, Disp: , Rfl:    ferrous sulfate 324 MG TBEC, Take 324 mg by mouth 2 (two) times daily., Disp: , Rfl:    hydrocortisone cream 1 %, Apply topically 3 (three) times daily as needed for itching., Disp: 28 g, Rfl: 0   levothyroxine (SYNTHROID) 25 MCG tablet, Take 25 mcg by mouth daily before breakfast., Disp: , Rfl:    metoprolol succinate (TOPROL XL) 25 MG 24 hr tablet, Take 1 tablet (25 mg total) by mouth daily., Disp: 30 tablet, Rfl: 5   sacubitril-valsartan (ENTRESTO) 24-26 MG, Take 1 tablet by mouth 2 (two) times daily., Disp: 60 tablet, Rfl: 3   spironolactone (ALDACTONE) 25 MG tablet, Take 0.5 tablets (12.5 mg total) by mouth daily., Disp: 45 tablet, Rfl: 3  Past Medical History: Past Medical History:  Diagnosis Date   Arthritis    Heart murmur    Hypertension    Hypothyroidism    Mitral regurgitation    Uterine fibroid     Tobacco Use: Social History   Tobacco Use  Smoking Status Never   Smokeless Tobacco Never    Labs: Review Flowsheet       Latest Ref Rng & Units 04/05/2021 02/14/2022 02/16/2022  Labs for ITP Cardiac and Pulmonary Rehab  Hemoglobin A1c 4.8 - 5.6 % - 5.2  -  PH, Arterial 7.35 - 7.45 7.414  7.5  7.398  7.367  7.270  7.383  7.356  7.367  7.417  7.386   PCO2 arterial 32 - 48 mmHg 30.0  34  28.6  29.5  42.2  32.9  39.6  39.5  34.1  34.1   Bicarbonate 20.0 - 28.0 mmol/L 20.8  21.0  19.2  26.5  17.6  17.0  19.8  19.6  22.2  22.7  41.1  21.9  20.5   TCO2 22 - 32 mmol/L 22  22  20   - 19  18  21  21  22  24  23  24  24  27   43  23  24  21  24    Acid-base deficit 0.0 - 2.0 mmol/L 3.0  3.0  4.0  - 6.0  7.0  7.0  5.0  3.0  2.0  2.0  4.0   O2 Saturation % 82  83  97  100  98  99  98  100  100  100  84  100  100  Capillary Blood Glucose: Lab Results  Component Value Date   GLUCAP 81 02/25/2022   GLUCAP 98 02/25/2022   GLUCAP 110 (H) 02/24/2022   GLUCAP 80 02/24/2022   GLUCAP 91 02/24/2022     Exercise Target Goals: Exercise Program Goal: Individual exercise prescription set using results from initial 6 min walk test and THRR while considering  patient's activity barriers and safety.   Exercise Prescription Goal: Initial exercise prescription builds to 30-45 minutes a day of aerobic activity, 2-3 days per week.  Home exercise guidelines will be given to patient during program as part of exercise prescription that the participant will acknowledge.  Activity Barriers & Risk Stratification:  Activity Barriers & Cardiac Risk Stratification - 04/06/22 1049       Activity Barriers & Cardiac Risk Stratification   Activity Barriers Arthritis;Deconditioning;Incisional Pain;Balance Concerns;History of Falls;Other (comment)    Comments sternal precautions    Cardiac Risk Stratification High   <5 METS on            6 Minute Walk:  6 Minute Walk     Row Name 04/06/22 1155         6 Minute Walk   Phase Initial     Distance 1021 feet     Walk  Time 6 minutes     # of Rest Breaks 0     MPH 1.9     METS 2.56     RPE 11     Perceived Dyspnea  0     VO2 Peak 8.94     Symptoms No     Resting HR 70 bpm     Resting BP 136/82     Resting Oxygen Saturation  98 %     Exercise Oxygen Saturation  during 6 min walk 98 %     Max Ex. HR 103 bpm     Max Ex. BP 140/86     2 Minute Post BP 126/80              Oxygen Initial Assessment:   Oxygen Re-Evaluation:   Oxygen Discharge (Final Oxygen Re-Evaluation):   Initial Exercise Prescription:  Initial Exercise Prescription - 04/06/22 1000       Date of Initial Exercise RX and Referring Provider   Date 04/06/22    Referring Provider Lennie Odor, MD    Expected Discharge Date 06/03/22      NuStep   Level 1    SPM 60    Minutes 15    METs 2      Track   Laps 10   big track   Minutes 15    METs 2      Prescription Details   Frequency (times per week) 3    Duration Progress to 30 minutes of continuous aerobic without signs/symptoms of physical distress      Intensity   THRR 40-80% of Max Heartrate 66-132    Ratings of Perceived Exertion 11-13    Perceived Dyspnea 0-4      Progression   Progression Continue progressive overload as per policy without signs/symptoms or physical distress.      Resistance Training   Training Prescription Yes    Weight 2    Reps 10-15             Perform Capillary Blood Glucose checks as needed.  Exercise Prescription Changes:   Exercise Prescription Changes     Row Name 04/11/22 0800 04/25/22 [phone] 04/27/22 [phone]  Response to Exercise   Blood Pressure (Admit) 132/80 102/82 120/84     Blood Pressure (Exercise) 130/90 140/90 156/92     Blood Pressure (Exit) 114/86 111/76 112/84     Heart Rate (Admit) 90 bpm 100 bpm 85 bpm     Heart Rate (Exercise) 117 bpm 138 bpm 123 bpm     Heart Rate (Exit) 99 bpm 103 bpm 94 bpm     Rating of Perceived Exertion (Exercise) 13 9.5 10     Perceived Dyspnea (Exercise) 0 0 0      Symptoms none chronic left knee pain 7/10 --     Comments Pt first day in CRP2 program Reviewed MET's, goals and home ExRx reviewed home ExRx     Duration Continue with 30 min of aerobic exercise without signs/symptoms of physical distress. Continue with 30 min of aerobic exercise without signs/symptoms of physical distress. Continue with 30 min of aerobic exercise without signs/symptoms of physical distress.     Intensity THRR unchanged THRR unchanged THRR unchanged       Progression   Progression Continue to progress workloads to maintain intensity without signs/symptoms of physical distress. Continue to progress workloads to maintain intensity without signs/symptoms of physical distress. Continue to progress workloads to maintain intensity without signs/symptoms of physical distress.     Average METs 2.09 2.42 2.3       Resistance Training   Training Prescription Yes Yes No     Weight 2 2 --     Reps 10-15 10-15 --     Time -- 10 Minutes --       NuStep   Level SPM 81 127 121     Minutes METs 1.9 2.3 2.2       Track   Laps Minutes METs 2.28 2.53 2.4       Home Exercise Plan   Plans to continue exercise at -- -- Home (comment)     Frequency -- -- Add 4 additional days to program exercise sessions.     Initial Home Exercises Provided -- -- 04/27/22              Exercise Comments:   Exercise Comments     Row Name 04/11/22 0836 04/25/22 0943 04/27/22 0834       Exercise Comments Pt first day in CRP2 program. Pt tolerated exercise well with an average MET level of 2.09. Pt is learning her THRR, ExRx, and RPE scale. Overall pt is off to a great start. Will continue to monitor and progress workloads as tolerated without s/sx. Reviewed MET's and goals. Pt tolerated exercise well with an average MET level of 2.42. Pt feels good about her goals of gaining strength and stamina and living a healthier lifestyle. Pt continues to  work on wt loss, but has adopted many good habids from out RD and feels like she has the resources she needs to succeed. Overal pt is doing very well and is increasing MET's. Started talking about home ExRx, will continue to follow and review home ExRx soon reviewed Home ExRx. Pt tolerated exercise well with an average MET level of 2.3. Pt is already doing some physical therapy leg exercises twice daily, but encouraged adding in some chair fitness youtub classes for 1-2 days for some cardio.  Exercise Goals and Review:   Exercise Goals     Row Name 04/06/22 1050             Exercise Goals   Increase Physical Activity Yes       Intervention Provide advice, education, support and counseling about physical activity/exercise needs.;Develop an individualized exercise prescription for aerobic and resistive training based on initial evaluation findings, risk stratification, comorbidities and participant's personal goals.       Expected Outcomes Short Term: Attend rehab on a regular basis to increase amount of physical activity.;Long Term: Exercising regularly at least 3-5 days a week.;Long Term: Add in home exercise to make exercise part of routine and to increase amount of physical activity.       Increase Strength and Stamina Yes       Intervention Provide advice, education, support and counseling about physical activity/exercise needs.;Develop an individualized exercise prescription for aerobic and resistive training based on initial evaluation findings, risk stratification, comorbidities and participant's personal goals.       Expected Outcomes Short Term: Increase workloads from initial exercise prescription for resistance, speed, and METs.;Short Term: Perform resistance training exercises routinely during rehab and add in resistance training at home;Long Term: Improve cardiorespiratory fitness, muscular endurance and strength as measured by increased METs and functional capacity  ( )       Able to understand and use rate of perceived exertion (RPE) scale Yes       Intervention Provide education and explanation on how to use RPE scale       Expected Outcomes Short Term: Able to use RPE daily in rehab to express subjective intensity level;Long Term:  Able to use RPE to guide intensity level when exercising independently       Knowledge and understanding of Target Heart Rate Range (THRR) Yes       Intervention Provide education and explanation of THRR including how the numbers were predicted and where they are located for reference       Expected Outcomes Short Term: Able to state/look up THRR;Long Term: Able to use THRR to govern intensity when exercising independently;Short Term: Able to use daily as guideline for intensity in rehab       Understanding of Exercise Prescription Yes       Intervention Provide education, explanation, and written materials on patient's individual exercise prescription       Expected Outcomes Short Term: Able to explain program exercise prescription;Long Term: Able to explain home exercise prescription to exercise independently                Exercise Goals Re-Evaluation :  Exercise Goals Re-Evaluation     Row Name 04/11/22 0834 04/25/22 0940 04/27/22 0831         Exercise Goal Re-Evaluation   Exercise Goals Review Increase Physical Activity;Understanding of Exercise Prescription;Increase Strength and Stamina;Knowledge and understanding of Target Heart Rate Range (THRR);Able to understand and use rate of perceived exertion (RPE) scale Increase Physical Activity;Understanding of Exercise Prescription;Increase Strength and Stamina;Knowledge and understanding of Target Heart Rate Range (THRR);Able to understand and use rate of perceived exertion (RPE) scale Increase Physical Activity;Understanding of Exercise Prescription;Increase Strength and Stamina;Knowledge and understanding of Target Heart Rate Range (THRR);Able to understand and use  rate of perceived exertion (RPE) scale     Comments Pt first day in CRP2 program. Pt tolerated exercise well with an average MET level of 2.09. Pt is learning her THRR, ExRx, and RPE scale. Pt is off to a great  start, really enjoys the nustep and said it makes her knees feel better. Reviewed MET's and goals. Pt tolerated exercise well with an average MET level of 2.42. Pt feels good about her goals of gaining strength and stamina and living a healthier lifestyle. Pt continues to work on wt loss, but has adopted many good habids from out RD and feels like she has the resources she needs to succeed. Overal pt is doing very well and is increasing MET's reviewed Home ExRx. Pt tolerated exercise well with an average MET level of 2.3. Pt is already doing some physical therapy leg exercises twice daily, but encouraged adding in some chair fitness youtub classes for 1-2 days for some cardio.     Expected Outcomes Will continue to monitor and progress workloads as tolerated without s/sx. Will continue to monitor and progress workloads as tolerated without s/sx. Will continue to monitor and progress workloads as tolerated without s/sx.              Discharge Exercise Prescription (Final Exercise Prescription Changes):  Exercise Prescription Changes - 04/27/22 0829       Response to Exercise   Blood Pressure (Admit) 120/84    Blood Pressure (Exercise) 156/92    Blood Pressure (Exit) 112/84    Heart Rate (Admit) 85 bpm    Heart Rate (Exercise) 123 bpm    Heart Rate (Exit) 94 bpm    Rating of Perceived Exertion (Exercise) 10    Perceived Dyspnea (Exercise) 0    Comments reviewed home ExRx    Duration Continue with 30 min of aerobic exercise without signs/symptoms of physical distress.    Intensity THRR unchanged      Progression   Progression Continue to progress workloads to maintain intensity without signs/symptoms of physical distress.    Average METs 2.3      Resistance Training   Training  Prescription No      NuStep   Level 3    SPM 121    Minutes 15    METs 2.2      Track   Laps 11    Minutes 15    METs 2.4      Home Exercise Plan   Plans to continue exercise at Home (comment)    Frequency Add 4 additional days to program exercise sessions.    Initial Home Exercises Provided 04/27/22             Nutrition:  Target Goals: Understanding of nutrition guidelines, daily intake of sodium 1500mg , cholesterol 200mg , calories 30% from fat and 7% or less from saturated fats, daily to have 5 or more servings of fruits and vegetables.  Biometrics:  Pre Biometrics - 04/06/22 0831       Pre Biometrics   Waist Circumference 43 inches    Hip Circumference 49.5 inches    Waist to Hip Ratio 0.87 %    Triceps Skinfold 45 mm    % Body Fat 39.4 %    Grip Strength 24 kg    Flexibility 16 in    Single Leg Stand 3 seconds              Nutrition Therapy Plan and Nutrition Goals:  Nutrition Therapy & Goals - 04/13/22 1037       Nutrition Therapy   Diet Heart Healthy Diet      Personal Nutrition Goals   Nutrition Goal Patient to identify strategies for reducing cardiovascular risk by attending the [employer] education and nutrition series weekly.  Personal Goal #2 Patient to improve diet quality by using the plate method as a guide for meal planning to include lean protein/plant protein, fruits, vegetables, whole grains, nonfat dairy as part of a well-balanced diet.    Personal Goal #3 Patient to identify strategies for weight loss of 0.5-2.0# per week.    Personal Goal #4 Patient to limit sodium intake to 1500mg  per day    Comments Patient has previously seen outside RD on 09/03/21, 12/03/21 related to morbid obesity. She is motivated to reduce sodium intake and increase fruit and vegetable intake. Patient will benefit from participation in intensive cardiac rehab for nutrition, exercise, and lifestyle modification.      Intervention Plan   Intervention  Prescribe, educate and counsel regarding individualized specific dietary modifications aiming towards targeted core components such as weight, hypertension, lipid management, diabetes, heart failure and other comorbidities.;Nutrition handout(s) given to patient.    Expected Outcomes Short Term Goal: Understand basic principles of dietary content, such as calories, fat, sodium, cholesterol and nutrients.;Long Term Goal: Adherence to prescribed nutrition plan.             Nutrition Assessments:  Nutrition Assessments - 04/11/22 0833       Rate Your Plate Scores   Pre Score 62            MEDIFICTS Score Key: ?70 Need to make dietary changes  40-70 Heart Healthy Diet ? 40 Therapeutic Level Cholesterol Diet   Flowsheet Row CARDIAC REHAB PHASE II EXERCISE from 04/11/2022 in [facility] Heart, Vascular, & Lung Health  Picture Your Plate Total Score on Admission 62      Picture Your Plate Scores: <16 Unhealthy dietary pattern with much room for improvement. 41-50 Dietary pattern unlikely to meet recommendations for good health and room for improvement. 51-60 More healthful dietary pattern, with some room for improvement.  >60 Healthy dietary pattern, although there may be some specific behaviors that could be improved.    Nutrition Goals Re-Evaluation:  Nutrition Goals Re-Evaluation     Row Name 04/11/22 0821 04/13/22 1037           Goals   Current Weight 220 lb 7.4 oz (100 kg) 222 lb 0.1 oz (100.7 kg)      Comment A1c WNL A1c WNL      Expected Outcome Patient has previously seen outside RD on 09/03/21, 12/03/21 related to morbid obesity. She is motivated to reduce sodium intake and increase fruit and vegetable intake. Patient will benefit from participation in intensive cardiac rehab for nutrition, exercise, and lifestyle modification. Emme will benefit from participation in intensive cardiac rehab for nutrition, exercise, and lifestyle  modification.               Nutrition Goals Re-Evaluation:  Nutrition Goals Re-Evaluation     Row Name 04/11/22 0821 04/13/22 1037           Goals   Current Weight 220 lb 7.4 oz (100 kg) 222 lb 0.1 oz (100.7 kg)      Comment A1c WNL A1c WNL      Expected Outcome Patient has previously seen outside RD on 09/03/21, 12/03/21 related to morbid obesity. She is motivated to reduce sodium intake and increase fruit and vegetable intake. Patient will benefit from participation in intensive cardiac rehab for nutrition, exercise, and lifestyle modification. Loisann will benefit from participation in intensive cardiac rehab for nutrition, exercise, and lifestyle modification.  Nutrition Goals Discharge (Final Nutrition Goals Re-Evaluation):  Nutrition Goals Re-Evaluation - 04/13/22 1037       Goals   Current Weight 222 lb 0.1 oz (100.7 kg)    Comment A1c WNL    Expected Outcome Beyounce will benefit from participation in intensive cardiac rehab for nutrition, exercise, and lifestyle modification.             Psychosocial: Target Goals: Acknowledge presence or absence of significant depression and/or stress, maximize coping skills, provide positive support system. Participant is able to verbalize types and ability to use techniques and skills needed for reducing stress and depression.  Initial Review & Psychosocial Screening:  Initial Psych Review & Screening - 04/06/22 1051       Initial Review   Current issues with None Identified      Family Dynamics   Good Support System? Yes   Kailyn has her mother, sister, and children for support     Barriers   Psychosocial barriers to participate in program There are no identifiable barriers or psychosocial needs.      Screening Interventions   Interventions Encouraged to exercise;Provide feedback about the scores to participant    Expected Outcomes Short Term goal: Identification and review with participant of  any Quality of Life or Depression concerns found by scoring the questionnaire.;Long Term goal: The participant improves quality of Life and PHQ9 Scores as seen by post scores and/or verbalization of changes             Quality of Life Scores:  Quality of Life - 04/06/22 1155       Quality of Life   Select Quality of Life      Quality of Life Scores   Health/Function Pre 17.97 %    Socioeconomic Pre 16.56 %    Psych/Spiritual Pre 17.79 %    Family Pre 20.3 %    GLOBAL Pre 17.94 %            Scores of 19 and below usually indicate a poorer quality of life in these areas.  A difference of  2-3 points is a clinically meaningful difference.  A difference of 2-3 points in the total score of the Quality of Life Index has been associated with significant improvement in overall quality of life, self-image, physical symptoms, and general health in studies assessing change in quality of life.  PHQ-9: Review Flowsheet       04/06/2022 02/10/2022 02/04/2021  Depression screen PHQ 2/9  Decreased Interest 0 0 0  Down, Depressed, Hopeless 0 0 1  PHQ - 2 Score 0 0 1  Altered sleeping 0 0 1  Tired, decreased energy 0 0 1  Change in appetite 0 0 0  Feeling bad or failure about yourself  0 0 0  Trouble concentrating 0 0 0  Moving slowly or fidgety/restless 0 0 0  Suicidal thoughts 0 0 0  PHQ-9 Score 0 0 3  Difficult doing work/chores - - Not difficult at all   Interpretation of Total Score  Total Score Depression Severity:  1-4 = Minimal depression, 5-9 = Mild depression, 10-14 = Moderate depression, 15-19 = Moderately severe depression, 20-27 = Severe depression   Psychosocial Evaluation and Intervention:   Psychosocial Re-Evaluation:  Psychosocial Re-Evaluation     Row Name 04/11/22 1711 05/09/22 0818           Psychosocial Re-Evaluation   Current issues with None Identified None Identified      Interventions Encouraged to attend  Cardiac Rehabilitation for the exercise  Encouraged to attend Cardiac Rehabilitation for the exercise      Continue Psychosocial Services  No Follow up required No Follow up required               Psychosocial Discharge (Final Psychosocial Re-Evaluation):  Psychosocial Re-Evaluation - 05/09/22 0818       Psychosocial Re-Evaluation   Current issues with None Identified    Interventions Encouraged to attend Cardiac Rehabilitation for the exercise    Continue Psychosocial Services  No Follow up required             Vocational Rehabilitation: Provide vocational rehab assistance to qualifying candidates.   Vocational Rehab Evaluation & Intervention:  Vocational Rehab - 04/20/22 0903       Vocational Rehab Re-Evaulation   Comments Patient turned in completed forms on 04/20/22 and formes were faxed to Vocational rehab - [place]             Education: Education Goals: Education classes will be provided on a weekly basis, covering required topics. Participant will state understanding/return demonstration of topics presented.    Education     Row Name 04/13/22 1000     Education   Cardiac Education Topics [employer]ikin   Secondary school teacher School   Educator Dietitian   Weekly Topic Personalizing Your Pritikin Plate   Instruction Review Code 1- Verbalizes Understanding   Class Start Time [phone]   Class Stop Time 0848   Class Time Calculation (min) 38 min    Row Name 04/15/22 1100     Education   Cardiac Education Topics Pritikin   Nurse, children's   Educator Exercise Physiologist   Select Psychosocial   Psychosocial Healthy Minds, Bodies, Hearts   Instruction Review Code 1- Verbalizes Understanding   Class Start Time [phone]   Class Stop Time 0853   Class Time Calculation (min) 36 min    Row Name 04/18/22 0900     Education   Cardiac Education Topics Pritikin   Glass blower/designer Nutrition   Nutrition Workshop Label  Reading   Instruction Review Code 1- Verbalizes Understanding   Class Start Time 0815   Class Stop Time 0905   Class Time Calculation (min) 50 min    Row Name 04/20/22 1100     Education   Cardiac Education Topics Pritikin   Orthoptist   Educator Dietitian   Weekly Topic [employer] Desserts   Instruction Review Code 1- Verbalizes Understanding   Class Start Time 0815   Class Stop Time 0856   Class Time Calculation (min) 41 min    Row Name 04/22/22 0900     Education   Cardiac Education Topics Pritikin   Select Core Videos     Core Videos   Educator Dietitian   Select Nutrition   Nutrition Other   Instruction Review Code 1- Verbalizes Understanding   Class Start Time 0815   Class Stop Time 0900   Class Time Calculation (min) 45 min    Row Name 04/25/22 0900     Education   Cardiac Education Topics Pritikin   Geographical information systems officer Exercise   Exercise Workshop Exercise Basics: Building Your Action Plan   Instruction Review Code 1- [employer]  Class Start Time 0815   Class Stop Time 0903   Class Time Calculation (min) 48 min            Core Videos: Exercise    Move It!  Clinical staff conducted group or individual video education with verbal and written material and guidebook.  Patient learns the recommended Pritikin exercise program. Exercise with the goal of living a long, healthy life. Some of the health benefits of exercise include controlled diabetes, healthier blood pressure levels, improved cholesterol levels, improved heart and lung capacity, improved sleep, and better body composition. Everyone should speak with their doctor before starting or changing an exercise routine.  Biomechanical Limitations Clinical staff conducted group or individual video education with verbal and written material and guidebook.  Patient learns how biomechanical limitations can  impact exercise and how we can mitigate and possibly overcome limitations to have an impactful and balanced exercise routine.  Body Composition Clinical staff conducted group or individual video education with verbal and written material and guidebook.  Patient learns that body composition (ratio of muscle mass to fat mass) is a key component to assessing overall fitness, rather than body weight alone. Increased fat mass, especially visceral belly fat, can put [place] at increased risk for metabolic syndrome, type 2 diabetes, heart disease, and even death. It is recommended to combine diet and exercise (cardiovascular and resistance training) to improve your body composition. Seek guidance from your physician and exercise physiologist before implementing an exercise routine.  Exercise Action Plan Clinical staff conducted group or individual video education with verbal and written material and guidebook.  Patient learns the recommended strategies to achieve and enjoy long-term exercise adherence, including variety, self-motivation, self-efficacy, and positive decision making. Benefits of exercise include fitness, good health, weight management, more energy, better sleep, less stress, and overall well-being.  Medical   Heart Disease Risk Reduction Clinical staff conducted group or individual video education with verbal and written material and guidebook.  Patient learns our heart is our most vital organ as it circulates oxygen, nutrients, white blood cells, and hormones throughout the entire body, and carries waste away. Data supports a plant-based eating plan like the Pritikin Program for its effectiveness in slowing progression of and reversing heart disease. The video provides a number of recommendations to address heart disease.   Metabolic Syndrome and Belly Fat  Clinical staff conducted group or individual video education with verbal and written material and guidebook.  Patient learns what metabolic  syndrome is, how it leads to heart disease, and how one can reverse it and keep it from coming back. You have metabolic syndrome if you have 3 of the following 5 criteria: abdominal obesity, high blood pressure, high triglycerides, low HDL cholesterol, and high blood sugar.  Hypertension and Heart Disease Clinical staff conducted group or individual video education with verbal and written material and guidebook.  Patient learns that high blood pressure, or hypertension, is very common in the [place]. Hypertension is largely due to excessive salt intake, but other important risk factors include being overweight, physical inactivity, drinking too much alcohol, smoking, and not eating enough potassium from fruits and vegetables. High blood pressure is a leading risk factor for heart attack, stroke, congestive heart failure, dementia, kidney failure, and premature death. Long-term effects of excessive salt intake include stiffening of the arteries and thickening of heart muscle and organ damage. Recommendations include ways to reduce hypertension and the risk of heart disease.  Diseases of Our Time - Focusing on  Diabetes Clinical staff conducted group or individual video education with verbal and written material and guidebook.  Patient learns why the best way to stop diseases of our time is prevention, through food and other lifestyle changes. Medicine (such as prescription pills and surgeries) is often only a Band-Aid on the problem, not a long-term solution. Most common diseases of our time include obesity, type 2 diabetes, hypertension, heart disease, and cancer. The Pritikin Program is recommended and has been proven to help reduce, reverse, and/or prevent the damaging effects of metabolic syndrome.  Nutrition   Overview of the Pritikin Eating Plan  Clinical staff conducted group or individual video education with verbal and written material and guidebook.  Patient learns about the Pritikin  Eating Plan for disease risk reduction. The Pritikin Eating Plan emphasizes a wide variety of unrefined, minimally-processed carbohydrates, like fruits, vegetables, whole grains, and legumes. Go, Caution, and Stop food choices are explained. Plant-based and lean animal proteins are emphasized. Rationale provided for low sodium intake for blood pressure control, low added sugars for blood sugar stabilization, and low added fats and oils for coronary artery disease risk reduction and weight management.  Calorie Density  Clinical staff conducted group or individual video education with verbal and written material and guidebook.  Patient learns about calorie density and how it impacts the Pritikin Eating Plan. Knowing the characteristics of the food you choose will help you decide whether those foods will lead to weight gain or weight loss, and whether you want to consume more or less of them. Weight loss is usually a side effect of the Pritikin Eating Plan because of its focus on low calorie-dense foods.  Label Reading  Clinical staff conducted group or individual video education with verbal and written material and guidebook.  Patient learns about the Pritikin recommended label reading guidelines and corresponding recommendations regarding calorie density, added sugars, sodium content, and whole grains.  Dining Out - Part 1  Clinical staff conducted group or individual video education with verbal and written material and guidebook.  Patient learns that restaurant meals can be sabotaging because they can be so high in calories, fat, sodium, and/or sugar. Patient learns recommended strategies on how to positively address this and avoid unhealthy pitfalls.  Facts on Fats  Clinical staff conducted group or individual video education with verbal and written material and guidebook.  Patient learns that lifestyle modifications can be just as effective, if not more so, as many medications for lowering your  risk of heart disease. A Pritikin lifestyle can help to reduce your risk of inflammation and atherosclerosis (cholesterol build-up, or plaque, in the artery walls). Lifestyle interventions such as dietary choices and physical activity address the cause of atherosclerosis. A review of the types of fats and their impact on blood cholesterol levels, along with dietary recommendations to reduce fat intake is also included.  Nutrition Action Plan  Clinical staff conducted group or individual video education with verbal and written material and guidebook.  Patient learns how to incorporate Pritikin recommendations into their lifestyle. Recommendations include planning and keeping personal health goals in mind as an important part of their success.  Healthy Mind-Set    Healthy Minds, Bodies, Hearts  Clinical staff conducted group or individual video education with verbal and written material and guidebook.  Patient learns how to identify when they are stressed. Video will discuss the impact of that stress, as well as the many benefits of stress management. Patient will also be introduced to stress management techniques.  The way we think, act, and feel has an impact on our hearts.  How Our Thoughts Can Heal Our Hearts  Clinical staff conducted group or individual video education with verbal and written material and guidebook.  Patient learns that negative thoughts can cause depression and anxiety. This can result in negative lifestyle behavior and serious health problems. Cognitive behavioral therapy is an effective method to help control our thoughts in order to change and improve our emotional outlook.  Additional Videos:  Exercise    Improving Performance  Clinical staff conducted group or individual video education with verbal and written material and guidebook.  Patient learns to use a non-linear approach by alternating intensity levels and lengths of time spent exercising to help burn more calories  and lose more body fat. Cardiovascular exercise helps improve heart health, metabolism, hormonal balance, blood sugar control, and recovery from fatigue. Resistance training improves strength, endurance, balance, coordination, reaction time, metabolism, and muscle mass. Flexibility exercise improves circulation, posture, and balance. Seek guidance from your physician and exercise physiologist before implementing an exercise routine and learn your capabilities and proper form for all exercise.  Introduction to Yoga  Clinical staff conducted group or individual video education with verbal and written material and guidebook.  Patient learns about yoga, a discipline of the coming together of mind, breath, and body. The benefits of yoga include improved flexibility, improved range of motion, better posture and core strength, increased lung function, weight loss, and positive self-image. Yoga's heart health benefits include lowered blood pressure, healthier heart rate, decreased cholesterol and triglyceride levels, improved immune function, and reduced stress. Seek guidance from your physician and exercise physiologist before implementing an exercise routine and learn your capabilities and proper form for all exercise.  Medical   Aging: Enhancing Your Quality of Life  Clinical staff conducted group or individual video education with verbal and written material and guidebook.  Patient learns key strategies and recommendations to stay in good physical health and enhance quality of life, such as prevention strategies, having an advocate, securing a Health Care Proxy and Power of Attorney, and keeping a list of medications and system for tracking them. It also discusses how to avoid risk for bone loss.  Biology of Weight Control  Clinical staff conducted group or individual video education with verbal and written material and guidebook.  Patient learns that weight gain occurs because we consume more calories than  we burn (eating more, moving less). Even if your body weight is normal, you may have higher ratios of fat compared to muscle mass. Too much body fat puts you at increased risk for cardiovascular disease, heart attack, stroke, type 2 diabetes, and obesity-related cancers. In addition to exercise, following the Pritikin Eating Plan can help reduce your risk.  Decoding Lab Results  Clinical staff conducted group or individual video education with verbal and written material and guidebook.  Patient learns that lab test reflects one measurement whose values change over time and are influenced by many factors, including medication, stress, sleep, exercise, food, hydration, pre-existing medical conditions, and more. It is recommended to use the knowledge from this video to become more involved with your lab results and evaluate your numbers to speak with your doctor.   Diseases of Our Time - Overview  Clinical staff conducted group or individual video education with verbal and written material and guidebook.  Patient learns that according to the CDC, 50% to 70% of chronic diseases (such as obesity, type 2 diabetes, elevated lipids, hypertension,  and heart disease) are avoidable through lifestyle improvements including healthier food choices, listening to satiety cues, and increased physical activity.  Sleep Disorders Clinical staff conducted group or individual video education with verbal and written material and guidebook.  Patient learns how good quality and duration of sleep are important to overall health and well-being. Patient also learns about sleep disorders and how they impact health along with recommendations to address them, including discussing with a physician.  Nutrition  Dining Out - Part 2 Clinical staff conducted group or individual video education with verbal and written material and guidebook.  Patient learns how to plan ahead and communicate in order to maximize their dining experience  in a healthy and nutritious manner. Included are recommended food choices based on the type of restaurant the patient is visiting.   Fueling a Banker conducted group or individual video education with verbal and written material and guidebook.  There is a strong connection between our food choices and our health. Diseases like obesity and type 2 diabetes are very prevalent and are in large-part due to lifestyle choices. The Pritikin Eating Plan provides plenty of food and hunger-curbing satisfaction. It is easy to follow, affordable, and helps reduce health risks.  Menu Workshop  Clinical staff conducted group or individual video education with verbal and written material and guidebook.  Patient learns that restaurant meals can sabotage health goals because they are often packed with calories, fat, sodium, and sugar. Recommendations include strategies to plan ahead and to communicate with the manager, chef, or server to help order a healthier meal.  Planning Your Eating Strategy  Clinical staff conducted group or individual video education with verbal and written material and guidebook.  Patient learns about the Pritikin Eating Plan and its benefit of reducing the risk of disease. The Pritikin Eating Plan does not focus on calories. Instead, it emphasizes high-quality, nutrient-rich foods. By knowing the characteristics of the foods, we choose, we can determine their calorie density and make informed decisions.  Targeting Your Nutrition Priorities  Clinical staff conducted group or individual video education with verbal and written material and guidebook.  Patient learns that lifestyle habits have a tremendous impact on disease risk and progression. This video provides eating and physical activity recommendations based on your personal health goals, such as reducing LDL cholesterol, losing weight, preventing or controlling type 2 diabetes, and reducing high blood  pressure.  Vitamins and Minerals  Clinical staff conducted group or individual video education with verbal and written material and guidebook.  Patient learns different ways to obtain key vitamins and minerals, including through a recommended healthy diet. It is important to discuss all supplements you take with your doctor.   Healthy Mind-Set    Smoking Cessation  Clinical staff conducted group or individual video education with verbal and written material and guidebook.  Patient learns that cigarette smoking and tobacco addiction pose a serious health risk which affects millions of people. Stopping smoking will significantly reduce the risk of heart disease, lung disease, and many forms of cancer. Recommended strategies for quitting are covered, including working with your doctor to develop a successful plan.  Culinary   Becoming a Set designer conducted group or individual video education with verbal and written material and guidebook.  Patient learns that cooking at home can be healthy, cost-effective, quick, and puts them in control. Keys to cooking healthy recipes will include looking at your recipe, assessing your equipment needs, planning ahead, making  it simple, choosing cost-effective seasonal ingredients, and limiting the use of added fats, salts, and sugars.  Cooking - Breakfast and Snacks  Clinical staff conducted group or individual video education with verbal and written material and guidebook.  Patient learns how important breakfast is to satiety and nutrition through the entire day. Recommendations include key foods to eat during breakfast to help stabilize blood sugar levels and to prevent overeating at meals later in the day. Planning ahead is also a key component.  Cooking - Educational psychologist conducted group or individual video education with verbal and written material and guidebook.  Patient learns eating strategies to improve overall  health, including an approach to cook more at home. Recommendations include thinking of animal protein as a side on your plate rather than center stage and focusing instead on lower calorie dense options like vegetables, fruits, whole grains, and plant-based proteins, such as beans. Making sauces in large quantities to freeze for later and leaving the skin on your vegetables are also recommended to maximize your experience.  Cooking - Healthy Salads and Dressing Clinical staff conducted group or individual video education with verbal and written material and guidebook.  Patient learns that vegetables, fruits, whole grains, and legumes are the foundations of the Pritikin Eating Plan. Recommendations include how to incorporate each of these in flavorful and healthy salads, and how to create homemade salad dressings. Proper handling of ingredients is also covered. Cooking - Soups and [employer] - Soups and Desserts Clinical staff conducted group or individual video education with verbal and written material and guidebook.  Patient learns that Pritikin soups and desserts make for easy, nutritious, and delicious snacks and meal components that are low in sodium, fat, sugar, and calorie density, while high in vitamins, minerals, and filling fiber. Recommendations include simple and healthy ideas for soups and desserts.   Overview     The Pritikin Solution Program Overview Clinical staff conducted group or individual video education with verbal and written material and guidebook.  Patient learns that the results of the Pritikin Program have been documented in more than 100 articles published in peer-reviewed journals, and the benefits include reducing risk factors for (and, in some cases, even reversing) high cholesterol, high blood pressure, type 2 diabetes, obesity, and more! An overview of the three key pillars of the Pritikin Program will be covered: eating well, doing regular exercise, and having a  healthy mind-set.  WORKSHOPS  Exercise: Exercise Basics: Building Your Action Plan Clinical staff led group instruction and group discussion with PowerPoint presentation and patient guidebook. To enhance the learning environment the use of posters, models and videos may be added. At the conclusion of this workshop, patients will comprehend the difference between physical activity and exercise, as well as the benefits of incorporating both, into their routine. Patients will understand the FITT (Frequency, Intensity, Time, and Type) principle and how to use it to build an exercise action plan. In addition, safety concerns and other considerations for exercise and cardiac rehab will be addressed by the presenter. The purpose of this lesson is to promote a comprehensive and effective weekly exercise routine in order to improve patients' overall level of fitness.   Managing Heart Disease: Your Path to a Healthier Heart Clinical staff led group instruction and group discussion with PowerPoint presentation and patient guidebook. To enhance the learning environment the use of posters, models and videos may be added.At the conclusion of this workshop, patients will understand the anatomy  and physiology of the heart. Additionally, they will understand how Pritikin's three pillars impact the risk factors, the progression, and the management of heart disease.  The purpose of this lesson is to provide a high-level overview of the heart, heart disease, and how the Pritikin lifestyle positively impacts risk factors.  Exercise Biomechanics Clinical staff led group instruction and group discussion with PowerPoint presentation and patient guidebook. To enhance the learning environment the use of posters, models and videos may be added. Patients will learn how the structural parts of their bodies function and how these functions impact their daily activities, movement, and exercise. Patients will learn how to  promote a neutral spine, learn how to manage pain, and identify ways to improve their physical movement in order to promote healthy living. The purpose of this lesson is to expose patients to common physical limitations that impact physical activity. Participants will learn practical ways to adapt and manage aches and pains, and to minimize their effect on regular exercise. Patients will learn how to maintain good posture while sitting, walking, and lifting.  Balance Training and Fall Prevention  Clinical staff led group instruction and group discussion with PowerPoint presentation and patient guidebook. To enhance the learning environment the use of posters, models and videos may be added. At the conclusion of this workshop, patients will understand the importance of their sensorimotor skills (vision, proprioception, and the vestibular system) in maintaining their ability to balance as they age. Patients will apply a variety of balancing exercises that are appropriate for their current level of function. Patients will understand the common causes for poor balance, possible solutions to these problems, and ways to modify their physical environment in order to minimize their fall risk. The purpose of this lesson is to teach patients about the importance of maintaining balance as they age and ways to minimize their risk of falling.  WORKSHOPS   Nutrition:  Fueling a Ship broker led group instruction and group discussion with PowerPoint presentation and patient guidebook. To enhance the learning environment the use of posters, models and videos may be added. Patients will review the foundational principles of the Pritikin Eating Plan and understand what constitutes a serving size in each of the food groups. Patients will also learn Pritikin-friendly foods that are better choices when away from home and review make-ahead meal and snack options. Calorie density will be reviewed and  applied to three nutrition priorities: weight maintenance, weight loss, and weight gain. The purpose of this lesson is to reinforce (in a group setting) the key concepts around what patients are recommended to eat and how to apply these guidelines when away from home by planning and selecting Pritikin-friendly options. Patients will understand how calorie density may be adjusted for different weight management goals.  Mindful Eating  Clinical staff led group instruction and group discussion with PowerPoint presentation and patient guidebook. To enhance the learning environment the use of posters, models and videos may be added. Patients will briefly review the concepts of the Pritikin Eating Plan and the importance of low-calorie dense foods. The concept of mindful eating will be introduced as well as the importance of paying attention to internal hunger signals. Triggers for non-hunger eating and techniques for dealing with triggers will be explored. The purpose of this lesson is to provide patients with the opportunity to review the basic principles of the Pritikin Eating Plan, discuss the value of eating mindfully and how to measure internal cues of hunger and fullness using the  Hunger Scale. Patients will also discuss reasons for non-hunger eating and learn strategies to use for controlling emotional eating.  Targeting Your Nutrition Priorities Clinical staff led group instruction and group discussion with PowerPoint presentation and patient guidebook. To enhance the learning environment the use of posters, models and videos may be added. Patients will learn how to determine their genetic susceptibility to disease by reviewing their family history. Patients will gain insight into the importance of diet as part of an overall healthy lifestyle in mitigating the impact of genetics and other environmental insults. The purpose of this lesson is to provide patients with the opportunity to assess their personal  nutrition priorities by looking at their family history, their own health history and current risk factors. Patients will also be able to discuss ways of prioritizing and modifying the Pritikin Eating Plan for their highest risk areas  Menu  Clinical staff led group instruction and group discussion with PowerPoint presentation and patient guidebook. To enhance the learning environment the use of posters, models and videos may be added. Using menus brought in from [employer], or printed from [employer], patients will apply the Pritikin dining out guidelines that were presented in the [employer] video. Patients will also be able to practice these guidelines in a variety of provided scenarios. The purpose of this lesson is to provide patients with the opportunity to practice hands-on learning of the Pritikin Dining Out guidelines with actual menus and practice scenarios.  Label Reading Clinical staff led group instruction and group discussion with PowerPoint presentation and patient guidebook. To enhance the learning environment the use of posters, models and videos may be added. Patients will review and discuss the Pritikin label reading guidelines presented in [employer]'s Label Reading Educational series video. Using fool labels brought in from local grocery stores and markets, patients will apply the label reading guidelines and determine if the packaged food meet the Pritikin guidelines. The purpose of this lesson is to provide patients with the opportunity to review, discuss, and practice hands-on learning of the Pritikin Label Reading guidelines with actual packaged food labels. Cooking School  Pritikin's [employer] are designed to teach patients ways to prepare quick, simple, and affordable recipes at home. The importance of nutrition's role in chronic disease risk reduction is reflected in its emphasis in the overall Pritikin program. By learning how to prepare  essential core Pritikin Eating Plan recipes, patients will increase control over what they eat; be able to customize the flavor of foods without the use of added salt, sugar, or fat; and improve the quality of the food they consume. By learning a set of core recipes which are easily assembled, quickly prepared, and affordable, patients are more likely to prepare more healthy foods at home. These workshops focus on convenient breakfasts, simple entres, side dishes, and desserts which can be prepared with minimal effort and are consistent with nutrition recommendations for cardiovascular risk reduction. Cooking [employer] are taught by a Armed forces logistics/support/administrative officer (RD) who has been trained by the [employer]. The chef or RD has a clear understanding of the importance of minimizing - if not completely eliminating - added fat, sugar, and sodium in recipes. Throughout the series of Cooking School Workshop sessions, patients will learn about healthy ingredients and efficient methods of cooking to build confidence in their capability to prepare    Cooking School weekly topics:  Adding Flavor- Sodium-Free  Fast and Healthy Breakfasts  Powerhouse Plant-Based Proteins  Satisfying Salads and Dressings  Simple Sides and Sauces  International Cuisine-Spotlight on the [employer] Zones  Delicious Desserts  Savory Soups  [employer] - Meals in a Snap  Tasty Appetizers and Snacks  Comforting Weekend Breakfasts  One-Pot Wonders   Fast Evening Meals  Landscape architect Your Pritikin Plate  WORKSHOPS   Healthy Mindset (Psychosocial):  Focused Goals, Sustainable Changes Clinical staff led group instruction and group discussion with PowerPoint presentation and patient guidebook. To enhance the learning environment the use of posters, models and videos may be added. Patients will be able to apply effective goal setting strategies to establish at least one personal goal, and  then take consistent, meaningful action toward that goal. They will learn to identify common barriers to achieving personal goals and develop strategies to overcome them. Patients will also gain an understanding of how our mind-set can impact our ability to achieve goals and the importance of cultivating a positive and growth-oriented mind-set. The purpose of this lesson is to provide patients with a deeper understanding of how to set and achieve personal goals, as well as the tools and strategies needed to overcome common obstacles which may arise along the way.  From Head to Heart: The Power of a Healthy Outlook  Clinical staff led group instruction and group discussion with PowerPoint presentation and patient guidebook. To enhance the learning environment the use of posters, models and videos may be added. Patients will be able to recognize and describe the impact of emotions and mood on physical health. They will discover the importance of self-care and explore self-care practices which may work for them. Patients will also learn how to utilize the 4 C's to cultivate a healthier outlook and better manage stress and challenges. The purpose of this lesson is to demonstrate to patients how a healthy outlook is an essential part of maintaining good health, especially as they continue their cardiac rehab journey.  Healthy Sleep for a Healthy Heart Clinical staff led group instruction and group discussion with PowerPoint presentation and patient guidebook. To enhance the learning environment the use of posters, models and videos may be added. At the conclusion of this workshop, patients will be able to demonstrate knowledge of the importance of sleep to overall health, well-being, and quality of life. They will understand the symptoms of, and treatments for, common sleep disorders. Patients will also be able to identify daytime and nighttime behaviors which impact sleep, and they will be able to apply these  tools to help manage sleep-related challenges. The purpose of this lesson is to provide patients with a general overview of sleep and outline the importance of quality sleep. Patients will learn about a few of the most common sleep disorders. Patients will also be introduced to the concept of "sleep hygiene," and discover ways to self-manage certain sleeping problems through simple daily behavior changes. Finally, the workshop will motivate patients by clarifying the links between quality sleep and their goals of heart-healthy living.   Recognizing and Reducing Stress Clinical staff led group instruction and group discussion with PowerPoint presentation and patient guidebook. To enhance the learning environment the use of posters, models and videos may be added. At the conclusion of this workshop, patients will be able to understand the types of stress reactions, differentiate between acute and chronic stress, and recognize the impact that chronic stress has on their health. They will also be able to apply different coping mechanisms, such as reframing negative self-talk. Patients will have the opportunity  to practice a variety of stress management techniques, such as deep abdominal breathing, progressive muscle relaxation, and/or guided imagery.  The purpose of this lesson is to educate patients on the role of stress in their lives and to provide healthy techniques for coping with it.  Learning Barriers/Preferences:  Learning Barriers/Preferences - 04/06/22 1158       Learning Barriers/Preferences   Learning Barriers Sight   wears glasses   Learning Preferences Audio;Computer/Internet;Group Instruction;Individual Instruction;Skilled Demonstration;Pictoral;Verbal Instruction;Video;Written Material             Education Topics:  Knowledge Questionnaire Score:  Knowledge Questionnaire Score - 04/06/22 1151       Knowledge Questionnaire Score   Pre Score 18/24             Core  Components/Risk Factors/Patient Goals at Admission:  Personal Goals and Risk Factors at Admission - 04/06/22 1152       Core Components/Risk Factors/Patient Goals on Admission    Weight Management Yes;Obesity;Weight Loss    Intervention Weight Management: Provide education and appropriate resources to help participant work on and attain dietary goals.;Weight Management: Develop a combined nutrition and exercise program designed to reach desired caloric intake, while maintaining appropriate intake of nutrient and fiber, sodium and fats, and appropriate energy expenditure required for the weight goal.;Weight Management/Obesity: Establish reasonable short term and long term weight goals.;Obesity: Provide education and appropriate resources to help participant work on and attain dietary goals.    Admit Weight 225 lb (102.1 kg)    Expected Outcomes Short Term: Continue to assess and modify interventions until short term weight is achieved;Long Term: Adherence to nutrition and physical activity/exercise program aimed toward attainment of established weight goal;Weight Loss: Understanding of general recommendations for a balanced deficit meal plan, which promotes 1-2 lb weight loss per week and includes a negative energy balance of [phone] kcal/d;Understanding recommendations for meals to include 15-35% energy as protein, 25-35% energy from fat, 35-60% energy from carbohydrates, less than 200mg  of dietary cholesterol, 20-35 gm of total fiber daily;Understanding of distribution of calorie intake throughout the day with the consumption of 4-5 meals/snacks    Hypertension Yes    Intervention Provide education on lifestyle modifcations including regular physical activity/exercise, weight management, moderate sodium restriction and increased consumption of fresh fruit, vegetables, and low fat dairy, alcohol moderation, and smoking cessation.;Monitor prescription use compliance.    Expected Outcomes Short Term:  Continued assessment and intervention until BP is < 140/62mm HG in hypertensive participants. < 130/79mm HG in hypertensive participants with diabetes, heart failure or chronic kidney disease.;Long Term: Maintenance of blood pressure at goal levels.             Core Components/Risk Factors/Patient Goals Review:   Goals and Risk Factor Review     Row Name 04/11/22 1713 05/09/22 0818           Core Components/Risk Factors/Patient Goals Review   Personal Goals Review Weight Management/Obesity;Hypertension Weight Management/Obesity;Hypertension      Review Magalie started intensive cardiac rehab on 04/11/22. Taralynn did well with exercise for her fitness level. Will continue to monitor bP Ilah is doing well with exercise at intensive cardiac rehab. Vital signs have been stable      Expected Outcomes Leeona will continue to participate in intensive cardiac rehab for exercise, nutrition and lifestyle modifications Rami will continue to participate in intensive cardiac rehab for exercise, nutrition and lifestyle modifications               Core Components/Risk  Factors/Patient Goals at Discharge (Final Review):   Goals and Risk Factor Review - 05/09/22 0818       Core Components/Risk Factors/Patient Goals Review   Personal Goals Review Weight Management/Obesity;Hypertension    Review Tarrie is doing well with exercise at intensive cardiac rehab. Vital signs have been stable    Expected Outcomes Vernesha will continue to participate in intensive cardiac rehab for exercise, nutrition and lifestyle modifications             ITP Comments:  ITP Comments     Row Name 04/06/22 0832 04/11/22 1711 05/09/22 0817       ITP Comments Dr. Armanda Magic medical director. Introduction to pritikin education program/ intesive cardiac rehab. Initial orientation packet reviewed with patient. 30 Day ITP Review. Doralee started intensive cardiac rehab on 03/1822/ and did well with  exercise 30 Day ITP Comments. Meleny has good attendance and participation in intensive cardiac rehab              Comments: See ITP comments.Thayer Headings RN BSN

## 2022-05-11 ENCOUNTER — Encounter (HOSPITAL_COMMUNITY): Payer: Medicaid Other

## 2022-05-11 ENCOUNTER — Encounter (HOSPITAL_COMMUNITY)
Admission: RE | Admit: 2022-05-11 | Discharge: 2022-05-11 | Disposition: A | Discharge status: Home / Self Care | Payer: Medicaid Other | Source: Ambulatory Visit | Attending: Cardiovascular Disease | Admitting: Cardiovascular Disease

## 2022-05-11 DIAGNOSIS — Z9889 Other specified postprocedural states: Secondary | ICD-10-CM

## 2022-05-12 ENCOUNTER — Telehealth: Payer: Self-pay | Admitting: Cardiovascular Disease

## 2022-05-12 DIAGNOSIS — I5022 Chronic systolic (congestive) heart failure: Secondary | ICD-10-CM

## 2022-05-12 NOTE — Telephone Encounter (Signed)
Called patient . Patient states she no longer has insurance it will cost over $400 dollars to have it  reinstated. She states she is not working at present time.   She has been out of Entresto  since Tuesday 05/10/22.    RN  discuss signing up for [employer] patient assistance- Entresto Patient states she is willing to  sign up for patient assistance - She will come by office tomorrow th pick form and bring back once completed patient aware  will leave sample bottle for pick up ( if available)

## 2022-05-12 NOTE — Telephone Encounter (Signed)
Patient calling the office for samples of medication:   1.  What medication and dosage are you requesting samples for?  sacubitril-valsartan (ENTRESTO) 24-26 MG [[id number]]   2.  Are you currently out of this medication? 2 Tab daily ,   Pt stated that she can not afford her ins policy right now and would like to know if there is anyway to get some sample of this med until she can find a cheaper plan   Best number [phone]

## 2022-05-12 NOTE — Telephone Encounter (Signed)
Informed patient , someone will contact patient when samples are available. She verbalized understanding.

## 2022-05-13 ENCOUNTER — Encounter (HOSPITAL_COMMUNITY)
Admission: RE | Admit: 2022-05-13 | Discharge: 2022-05-13 | Disposition: A | Discharge status: Home / Self Care | Payer: Medicaid Other | Source: Ambulatory Visit | Attending: Cardiovascular Disease | Admitting: Cardiovascular Disease

## 2022-05-13 ENCOUNTER — Other Ambulatory Visit (HOSPITAL_COMMUNITY): Payer: Self-pay

## 2022-05-13 ENCOUNTER — Encounter (HOSPITAL_COMMUNITY): Payer: Medicaid Other

## 2022-05-13 DIAGNOSIS — Z9889 Other specified postprocedural states: Secondary | ICD-10-CM

## 2022-05-13 MED ORDER — ENTRESTO 24-26 MG PO TABS: 1.0000 | ORAL_TABLET | Freq: Two times a day (BID) | ORAL | 0 refills | Status: DC

## 2022-05-13 NOTE — Telephone Encounter (Signed)
Patient assistance application faxed to [employer] 

## 2022-05-13 NOTE — Telephone Encounter (Signed)
Dispensed Entresto 24-26mg  sample to patient

## 2022-05-16 ENCOUNTER — Encounter (HOSPITAL_COMMUNITY): Payer: Medicaid Other

## 2022-05-16 ENCOUNTER — Encounter (HOSPITAL_COMMUNITY)
Admission: RE | Admit: 2022-05-16 | Discharge: 2022-05-16 | Disposition: A | Discharge status: Home / Self Care | Payer: Medicaid Other | Source: Ambulatory Visit | Attending: Cardiovascular Disease | Admitting: Cardiovascular Disease

## 2022-05-16 DIAGNOSIS — Z9889 Other specified postprocedural states: Secondary | ICD-10-CM

## 2022-05-18 ENCOUNTER — Encounter (HOSPITAL_COMMUNITY)
Admission: RE | Admit: 2022-05-18 | Discharge: 2022-05-18 | Disposition: A | Discharge status: Home / Self Care | Payer: Medicaid Other | Source: Ambulatory Visit | Attending: Cardiovascular Disease | Admitting: Cardiovascular Disease

## 2022-05-18 ENCOUNTER — Encounter (HOSPITAL_COMMUNITY): Payer: Medicaid Other

## 2022-05-18 DIAGNOSIS — Z9889 Other specified postprocedural states: Secondary | ICD-10-CM

## 2022-05-19 ENCOUNTER — Telehealth: Payer: Self-pay | Admitting: Cardiovascular Disease

## 2022-05-19 NOTE — Telephone Encounter (Signed)
Pt c/o medication issue:  1. Name of Medication: sacubitril-valsartan (ENTRESTO) 24-26 MG   2. How are you currently taking this medication (dosage and times per day)? Take 1 tablet by mouth 2 (two) times daily.   3. Are you having a reaction (difficulty breathing--STAT)? No   4. What is your medication issue? [facility] is calling to get verification on the medication. Please call back

## 2022-05-19 NOTE — Telephone Encounter (Signed)
Pt c/o medication issue:   1. Name of Medication: sacubitril-valsartan (ENTRESTO) 24-26 MG    2. How are you currently taking this medication (dosage and times per day)? Take 1 tablet by mouth 2 (two) times daily.    3. Are you having a reaction (difficulty breathing--STAT)? No    4. What is your medication issue? [facility] is calling to get verification on the medication. Please call back    Pharmacist just needed to verify this prescription due to the signature being "stamped"  Verified prescription with pharmacist, she will fill this prescription.

## 2022-05-20 ENCOUNTER — Encounter (HOSPITAL_COMMUNITY): Payer: Medicaid Other

## 2022-05-20 ENCOUNTER — Encounter (HOSPITAL_COMMUNITY)
Admission: RE | Admit: 2022-05-20 | Discharge: 2022-05-20 | Disposition: A | Discharge status: Home / Self Care | Payer: Medicaid Other | Source: Ambulatory Visit | Attending: Cardiovascular Disease | Admitting: Cardiovascular Disease

## 2022-05-20 DIAGNOSIS — Z9889 Other specified postprocedural states: Secondary | ICD-10-CM | POA: Diagnosis not present

## 2022-05-23 ENCOUNTER — Encounter (HOSPITAL_COMMUNITY): Payer: Medicaid Other

## 2022-05-23 ENCOUNTER — Encounter (HOSPITAL_COMMUNITY)
Admission: RE | Admit: 2022-05-23 | Discharge: 2022-05-23 | Disposition: A | Discharge status: Home / Self Care | Payer: Medicaid Other | Source: Ambulatory Visit | Attending: Cardiovascular Disease | Admitting: Cardiovascular Disease

## 2022-05-23 DIAGNOSIS — Z9889 Other specified postprocedural states: Secondary | ICD-10-CM | POA: Diagnosis not present

## 2022-05-24 ENCOUNTER — Other Ambulatory Visit: Payer: Self-pay | Admitting: Physician Assistant

## 2022-05-25 ENCOUNTER — Encounter (HOSPITAL_COMMUNITY)
Admission: RE | Admit: 2022-05-25 | Discharge: 2022-05-25 | Disposition: A | Discharge status: Home / Self Care | Payer: Medicaid Other | Source: Ambulatory Visit | Attending: Cardiovascular Disease | Admitting: Cardiovascular Disease

## 2022-05-25 ENCOUNTER — Encounter (HOSPITAL_COMMUNITY): Payer: Medicaid Other

## 2022-05-25 DIAGNOSIS — Z9889 Other specified postprocedural states: Secondary | ICD-10-CM | POA: Insufficient documentation

## 2022-05-26 ENCOUNTER — Telehealth: Payer: Self-pay | Admitting: Cardiovascular Disease

## 2022-05-26 MED ORDER — APIXABAN 5 MG PO TABS: 5.0000 mg | ORAL_TABLET | Freq: Two times a day (BID) | ORAL | 1 refills | Status: DC

## 2022-05-26 NOTE — Telephone Encounter (Signed)
*  STAT* If patient is at the pharmacy, call can be transferred to refill team.   1. Which medications need to be refilled? (please list name of each medication and dose if known) apixaban (ELIQUIS) 5 MG TABS tablet   2. Which pharmacy/location (including street and city if local pharmacy) is medication to be sent to?  [id number] - [place] ([place]), [place] - [address]    3. Do they need a 30 day or 90 day supply? 90 day supply   Patient is almost out of medication.

## 2022-05-26 NOTE — Telephone Encounter (Signed)
Prescription refill request for Eliquis received. Indication: afib  Last office visit: 04/08/2022 Scr: 0.79, 04/08/2022 Age: 57 yo  Weight: 100.3 kg   Refill sent.

## 2022-05-27 ENCOUNTER — Encounter (HOSPITAL_COMMUNITY): Payer: Medicaid Other

## 2022-05-27 ENCOUNTER — Encounter (HOSPITAL_COMMUNITY)
Admission: RE | Admit: 2022-05-27 | Discharge: 2022-05-27 | Disposition: A | Discharge status: Home / Self Care | Payer: Medicaid Other | Source: Ambulatory Visit | Attending: Cardiovascular Disease | Admitting: Cardiovascular Disease

## 2022-05-27 DIAGNOSIS — Z9889 Other specified postprocedural states: Secondary | ICD-10-CM

## 2022-05-30 ENCOUNTER — Encounter (HOSPITAL_COMMUNITY): Payer: Medicaid Other

## 2022-05-30 ENCOUNTER — Encounter (HOSPITAL_COMMUNITY)
Admission: RE | Admit: 2022-05-30 | Discharge: 2022-05-30 | Disposition: A | Discharge status: Home / Self Care | Payer: Medicaid Other | Source: Ambulatory Visit | Attending: Cardiovascular Disease | Admitting: Cardiovascular Disease

## 2022-05-30 DIAGNOSIS — Z9889 Other specified postprocedural states: Secondary | ICD-10-CM | POA: Diagnosis not present

## 2022-06-01 ENCOUNTER — Encounter (HOSPITAL_COMMUNITY)
Admission: RE | Admit: 2022-06-01 | Discharge: 2022-06-01 | Disposition: A | Discharge status: Home / Self Care | Payer: Medicaid Other | Source: Ambulatory Visit | Attending: Cardiovascular Disease | Admitting: Cardiovascular Disease

## 2022-06-01 ENCOUNTER — Encounter (HOSPITAL_COMMUNITY): Payer: Medicaid Other

## 2022-06-01 VITALS — Ht 62.0 in | Wt 216.9 lb

## 2022-06-01 DIAGNOSIS — Z9889 Other specified postprocedural states: Secondary | ICD-10-CM

## 2022-06-01 NOTE — Progress Notes (Signed)
Cardiac Individual Treatment Plan  Patient Details  Name: Melissa Riley MRN: [id number] Date of Birth: [birth year] Referring Provider:   Flowsheet Row CARDIAC REHAB PHASE II ORIENTATION from 04/06/2022 in [facility]  Referring Provider Lennie Odor, MD       Initial Encounter Date:  Flowsheet Row CARDIAC REHAB PHASE II ORIENTATION from 04/06/2022 in [facility]  Date 04/06/22       Visit Diagnosis: S/P mitral valve repair  Patient's Home Medications on Admission:  Current Outpatient Medications:    apixaban (ELIQUIS) 5 MG TABS tablet, Take 1 tablet (5 mg total) by mouth 2 (two) times daily., Disp: 180 tablet, Rfl: 1   Ascorbic Acid (VITAMIN C PO), Take 1 tablet by mouth daily., Disp: , Rfl:    aspirin EC 81 MG tablet, Take 81 mg by mouth daily. Swallow whole., Disp: , Rfl:    Cholecalciferol (VITAMIN D3) 25 MCG (1000 UT) CHEW, Chew by mouth daily. 2 gummies daily, Disp: , Rfl:    ferrous sulfate 324 MG TBEC, Take 324 mg by mouth 2 (two) times daily., Disp: , Rfl:    hydrocortisone cream 1 %, Apply topically 3 (three) times daily as needed for itching., Disp: 28 g, Rfl: 0   levothyroxine (SYNTHROID) 25 MCG tablet, Take 25 mcg by mouth daily before breakfast., Disp: , Rfl:    metoprolol succinate (TOPROL XL) 25 MG 24 hr tablet, Take 1 tablet (25 mg total) by mouth daily., Disp: 30 tablet, Rfl: 5   sacubitril-valsartan (ENTRESTO) 24-26 MG, Take 1 tablet by mouth 2 (two) times daily., Disp: 60 tablet, Rfl: 3   sacubitril-valsartan (ENTRESTO) 24-26 MG, Take 1 tablet by mouth 2 (two) times daily., Disp: 28 tablet, Rfl: 0   spironolactone (ALDACTONE) 25 MG tablet, Take 0.5 tablets (12.5 mg total) by mouth daily., Disp: 45 tablet, Rfl: 3  Past Medical History: Past Medical History:  Diagnosis Date   Arthritis    Heart murmur    Hypertension    Hypothyroidism     Mitral regurgitation    Uterine fibroid     Tobacco Use: Social History   Tobacco Use  Smoking Status Never  Smokeless Tobacco Never    Labs: Review Flowsheet       Latest Ref Rng & Units 04/05/2021 02/14/2022 02/16/2022  Labs for ITP Cardiac and Pulmonary Rehab  Hemoglobin A1c 4.8 - 5.6 % - 5.2  -  PH, Arterial 7.35 - 7.45 7.414  7.5  7.398  7.367  7.270  7.383  7.356  7.367  7.417  7.386   PCO2 arterial 32 - 48 mmHg 30.0  34  28.6  29.5  42.2  32.9  39.6  39.5  34.1  34.1   Bicarbonate 20.0 - 28.0 mmol/L 20.8  21.0  19.2  26.5  17.6  17.0  19.8  19.6  22.2  22.7  41.1  21.9  20.5   TCO2 22 - 32 mmol/L 22  22  20   - 19  18  21  21  22  24  23  24  24  27   43  23  24  21  24    Acid-base deficit 0.0 - 2.0 mmol/L 3.0  3.0  4.0  - 6.0  7.0  7.0  5.0  3.0  2.0  2.0  4.0   O2 Saturation % 82  83  97  100  98  99  98  100  100  100  84  100  100     Capillary Blood Glucose: Lab Results  Component Value Date   GLUCAP 81 02/25/2022   GLUCAP 98 02/25/2022   GLUCAP 110 (H) 02/24/2022   GLUCAP 80 02/24/2022   GLUCAP 91 02/24/2022     Exercise Target Goals: Exercise Program Goal: Individual exercise prescription set using results from initial 6 min walk test and THRR while considering  patient's activity barriers and safety.   Exercise Prescription Goal: Initial exercise prescription builds to 30-45 minutes a day of aerobic activity, 2-3 days per week.  Home exercise guidelines will be given to patient during program as part of exercise prescription that the participant will acknowledge.  Activity Barriers & Risk Stratification:  Activity Barriers & Cardiac Risk Stratification - 04/06/22 1049       Activity Barriers & Cardiac Risk Stratification   Activity Barriers Arthritis;Deconditioning;Incisional Pain;Balance Concerns;History of Falls;Other (comment)    Comments sternal precautions    Cardiac Risk Stratification High   <5 METS on            6 Minute Walk:  6  Minute Walk     Row Name 04/06/22 1155 06/01/22 0919       6 Minute Walk   Phase Initial Discharge    Distance 1021 feet 1210 feet    Distance % Change -- 18.51 %    Distance Feet Change -- 189 ft    Walk Time 6 minutes 6 minutes    # of Rest Breaks 0 0    MPH 1.9 2.29    METS 2.56 3.06    RPE 11 10    Perceived Dyspnea  0 0    VO2 Peak 8.94 10.7    Symptoms No No    Resting HR 70 bpm 82 bpm    Resting BP 136/82 110/84    Resting Oxygen Saturation  98 % --    Exercise Oxygen Saturation  during 6 min walk 98 % --    Max Ex. HR 103 bpm 114 bpm    Max Ex. BP 140/86 138/80    2 Minute Post BP 126/80 --             Oxygen Initial Assessment:   Oxygen Re-Evaluation:   Oxygen Discharge (Final Oxygen Re-Evaluation):   Initial Exercise Prescription:  Initial Exercise Prescription - 04/06/22 1000       Date of Initial Exercise RX and Referring Provider   Date 04/06/22    Referring Provider Lennie Odor, MD    Expected Discharge Date 06/03/22      NuStep   Level 1    SPM 60    Minutes 15    METs 2      Track   Laps 10   big track   Minutes 15    METs 2      Prescription Details   Frequency (times per week) 3    Duration Progress to 30 minutes of continuous aerobic without signs/symptoms of physical distress      Intensity   THRR 40-80% of Max Heartrate 66-132    Ratings of Perceived Exertion 11-13    Perceived Dyspnea 0-4      Progression   Progression Continue progressive overload as per policy without signs/symptoms or physical distress.      Resistance Training   Training Prescription Yes    Weight 2    Reps 10-15  Perform Capillary Blood Glucose checks as needed.  Exercise Prescription Changes:   Exercise Prescription Changes     Row Name 04/11/22 0800 04/25/22 0937 04/27/22 0829 05/09/22 0800 05/25/22 0800     Response to Exercise   Blood Pressure (Admit) 132/80 102/82 120/84 136/88 114/74   Blood Pressure (Exercise)  130/90 140/90 156/92 168/80 145/82   Blood Pressure (Exit) 114/86 111/76 112/84 120/70 106/80   Heart Rate (Admit) 90 bpm 100 bpm 85 bpm 94 bpm 84 bpm   Heart Rate (Exercise) 117 bpm 138 bpm 123 bpm 118 bpm 113 bpm   Heart Rate (Exit) 99 bpm 103 bpm 94 bpm 99 bpm 93 bpm   Rating of Perceived Exertion (Exercise) 13 9.5 10 9 11    Perceived Dyspnea (Exercise) 0 0 0 0 0   Symptoms none chronic left knee pain 7/10 -- -- None   Comments Pt first day in CRP2 program Reviewed MET's, goals and home ExRx reviewed home ExRx Reviewed METs Reviewed METs/Goals   Duration Continue with 30 min of aerobic exercise without signs/symptoms of physical distress. Continue with 30 min of aerobic exercise without signs/symptoms of physical distress. Continue with 30 min of aerobic exercise without signs/symptoms of physical distress. Continue with 30 min of aerobic exercise without signs/symptoms of physical distress. Continue with 30 min of aerobic exercise without signs/symptoms of physical distress.   Intensity THRR unchanged THRR unchanged THRR unchanged THRR unchanged THRR unchanged     Progression   Progression Continue to progress workloads to maintain intensity without signs/symptoms of physical distress. Continue to progress workloads to maintain intensity without signs/symptoms of physical distress. Continue to progress workloads to maintain intensity without signs/symptoms of physical distress. Continue to progress workloads to maintain intensity without signs/symptoms of physical distress. Continue to progress workloads to maintain intensity without signs/symptoms of physical distress.   Average METs 2.09 2.42 2.3 2.3 2.3     Resistance Training   Training Prescription Yes Yes No Yes No   Weight 2 2 -- 2 --   Reps 10-15 10-15 -- 10-15 --   Time -- 10 Minutes -- 10 Minutes --     NuStep   Level 1 1 3 4 5    SPM 81 127 121 126 121   Minutes 15 15 15 15 15    METs 1.9 2.3 2.2 2.2 2.2     Track   Laps 10  12 11 11 11    Minutes 15 15 15 15 15    METs 2.28 2.53 2.4 2.4 2.4     Home Exercise Plan   Plans to continue exercise at -- -- Home (comment) -- --   Frequency -- -- Add 4 additional days to program exercise sessions. -- --   Initial Home Exercises Provided -- -- 04/27/22 -- --            Exercise Comments:   Exercise Comments     Row Name 04/11/22 [phone] 04/25/22 0943 04/27/22 0834 05/09/22 0841 05/25/22 0838   Exercise Comments Pt first day in CRP2 program. Pt tolerated exercise well with an average MET level of 2.09. Pt is learning her THRR, ExRx, and RPE scale. Overall pt is off to a great start. Will continue to monitor and progress workloads as tolerated without s/sx. Reviewed MET's and goals. Pt tolerated exercise well with an average MET level of 2.42. Pt feels good about her goals of gaining strength and stamina and living a healthier lifestyle. Pt continues to work on wt loss, but has  adopted many good habids from out RD and feels like she has the resources she needs to succeed. Overal pt is doing very well and is increasing MET's. Started talking about home ExRx, will continue to follow and review home ExRx soon reviewed Home ExRx. Pt tolerated exercise well with an average MET level of 2.3. Pt is already doing some physical therapy leg exercises twice daily, but encouraged adding in some chair fitness youtub classes for 1-2 days for some cardio. Reviewed METs with pt today. Pt is tolerating an avg MET level of 2.3 well. Discussed how to increase METs, pt feels ready to increase workload on the nustep. Pt is limited by knee pain during walking track but is happy with her pace. Will continue to monitor and progress workloads as tolerated without s/sx. Reviewed METs and Goals with pt, pt is working towards goals with great progress, making effort  to increase METs, will increase WL as tolerated w/o s/s.            Exercise Goals and Review:   Exercise Goals     Row Name  04/06/22 1050             Exercise Goals   Increase Physical Activity Yes       Intervention Provide advice, education, support and counseling about physical activity/exercise needs.;Develop an individualized exercise prescription for aerobic and resistive training based on initial evaluation findings, risk stratification, comorbidities and participant's personal goals.       Expected Outcomes Short Term: Attend rehab on a regular basis to increase amount of physical activity.;Long Term: Exercising regularly at least 3-5 days a week.;Long Term: Add in home exercise to make exercise part of routine and to increase amount of physical activity.       Increase Strength and Stamina Yes       Intervention Provide advice, education, support and counseling about physical activity/exercise needs.;Develop an individualized exercise prescription for aerobic and resistive training based on initial evaluation findings, risk stratification, comorbidities and participant's personal goals.       Expected Outcomes Short Term: Increase workloads from initial exercise prescription for resistance, speed, and METs.;Short Term: Perform resistance training exercises routinely during rehab and add in resistance training at home;Long Term: Improve cardiorespiratory fitness, muscular endurance and strength as measured by increased METs and functional capacity ( )       Able to understand and use rate of perceived exertion (RPE) scale Yes       Intervention Provide education and explanation on how to use RPE scale       Expected Outcomes Short Term: Able to use RPE daily in rehab to express subjective intensity level;Long Term:  Able to use RPE to guide intensity level when exercising independently       Knowledge and understanding of Target Heart Rate Range (THRR) Yes       Intervention Provide education and explanation of THRR including how the numbers were predicted and where they are located for reference        Expected Outcomes Short Term: Able to state/look up THRR;Long Term: Able to use THRR to govern intensity when exercising independently;Short Term: Able to use daily as guideline for intensity in rehab       Understanding of Exercise Prescription Yes       Intervention Provide education, explanation, and written materials on patient's individual exercise prescription       Expected Outcomes Short Term: Able to explain program exercise prescription;Long Term: Able to explain  home exercise prescription to exercise independently                Exercise Goals Re-Evaluation :  Exercise Goals Re-Evaluation     Row Name 04/11/22 0834 04/25/22 0940 04/27/22 0831 05/25/22 0829       Exercise Goal Re-Evaluation   Exercise Goals Review Increase Physical Activity;Understanding of Exercise Prescription;Increase Strength and Stamina;Knowledge and understanding of Target Heart Rate Range (THRR);Able to understand and use rate of perceived exertion (RPE) scale Increase Physical Activity;Understanding of Exercise Prescription;Increase Strength and Stamina;Knowledge and understanding of Target Heart Rate Range (THRR);Able to understand and use rate of perceived exertion (RPE) scale Increase Physical Activity;Understanding of Exercise Prescription;Increase Strength and Stamina;Knowledge and understanding of Target Heart Rate Range (THRR);Able to understand and use rate of perceived exertion (RPE) scale Increase Physical Activity;Understanding of Exercise Prescription;Increase Strength and Stamina;Knowledge and understanding of Target Heart Rate Range (THRR);Able to understand and use rate of perceived exertion (RPE) scale    Comments Pt first day in CRP2 program. Pt tolerated exercise well with an average MET level of 2.09. Pt is learning her THRR, ExRx, and RPE scale. Pt is off to a great start, really enjoys the nustep and said it makes her knees feel better. Reviewed MET's and goals. Pt tolerated exercise well  with an average MET level of 2.42. Pt feels good about her goals of gaining strength and stamina and living a healthier lifestyle. Pt continues to work on wt loss, but has adopted many good habids from out RD and feels like she has the resources she needs to succeed. Overal pt is doing very well and is increasing MET's reviewed Home ExRx. Pt tolerated exercise well with an average MET level of 2.3. Pt is already doing some physical therapy leg exercises twice daily, but encouraged adding in some chair fitness youtub classes for 1-2 days for some cardio. Reviewed Mets/Goals today, pt tolerating an avg of 2.3 Mets well w/o unusual s/s. Pt has increased level on NuStep from lvl 4-lvl 5, however pt is still limited by chronic knee pain on track. Pt still pursuing goal of weight loss, is currently walking everyday outside of program and plans to attend [employer] with mom to continue exercising. Pt is hoping that wt loss will help allevaite knee pain.    Expected Outcomes Will continue to monitor and progress workloads as tolerated without s/sx. Will continue to monitor and progress workloads as tolerated without s/sx. Will continue to monitor and progress workloads as tolerated without s/sx. Pt is porgressing well, will continue to monitor and progress             Discharge Exercise Prescription (Final Exercise Prescription Changes):  Exercise Prescription Changes - 05/25/22 0800       Response to Exercise   Blood Pressure (Admit) 114/74    Blood Pressure (Exercise) 145/82    Blood Pressure (Exit) 106/80    Heart Rate (Admit) 84 bpm    Heart Rate (Exercise) 113 bpm    Heart Rate (Exit) 93 bpm    Rating of Perceived Exertion (Exercise) 11    Perceived Dyspnea (Exercise) 0    Symptoms None    Comments Reviewed METs/Goals    Duration Continue with 30 min of aerobic exercise without signs/symptoms of physical distress.    Intensity THRR unchanged      Progression   Progression Continue to progress  workloads to maintain intensity without signs/symptoms of physical distress.    Average METs 2.3  Resistance Training   Training Prescription No      NuStep   Level 5    SPM 121    Minutes 15    METs 2.2      Track   Laps 11    Minutes 15    METs 2.4             Nutrition:  Target Goals: Understanding of nutrition guidelines, daily intake of sodium 1500mg , cholesterol 200mg , calories 30% from fat and 7% or less from saturated fats, daily to have 5 or more servings of fruits and vegetables.  Biometrics:  Pre Biometrics - 04/06/22 0831       Pre Biometrics   Waist Circumference 43 inches    Hip Circumference 49.5 inches    Waist to Hip Ratio 0.87 %    Triceps Skinfold 45 mm    % Body Fat 39.4 %    Grip Strength 24 kg    Flexibility 16 in    Single Leg Stand 3 seconds             Post Biometrics - 06/01/22 1610        Post  Biometrics   Height 5\' 2"  (1.575 m)    Weight 98.4 kg    Waist Circumference 38.5 inches    Hip Circumference 44 inches    Waist to Hip Ratio 0.88 %    BMI (Calculated) 39.67    Triceps Skinfold 44 mm    % Body Fat 48.6 %   Previous Pre was incorrect 49.6% BF   Grip Strength 27 kg    Flexibility 18.25 in    Single Leg Stand 7 seconds             Nutrition Therapy Plan and Nutrition Goals:  Nutrition Therapy & Goals - 05/13/22 0847       Nutrition Therapy   Diet Heart Healthy Diet      Personal Nutrition Goals   Nutrition Goal Patient to identify strategies for reducing cardiovascular risk by attending the [employer] education and nutrition series weekly.    Personal Goal #2 Patient to improve diet quality by using the plate method as a guide for meal planning to include lean protein/plant protein, fruits, vegetables, whole grains, nonfat dairy as part of a well-balanced diet.    Personal Goal #3 Patient to identify strategies for weight loss of 0.5-2.0# per week.    Personal Goal #4 Patient to limit sodium intake to  1500mg  per day    Comments Goals in action. Melissa Riley continues to attend some of the [employer] education and nutrition series. She has started making some changes including increased high fiber foods and decreased high fat foods. She continues mindfulness of her sodium intake. She is down 3.3# since starting with our program. Patient will benefit from participation in intensive cardiac rehab for nutrition, exercise, and lifestyle modification.      Intervention Plan   Intervention Prescribe, educate and counsel regarding individualized specific dietary modifications aiming towards targeted core components such as weight, hypertension, lipid management, diabetes, heart failure and other comorbidities.;Nutrition handout(s) given to patient.    Expected Outcomes Short Term Goal: Understand basic principles of dietary content, such as calories, fat, sodium, cholesterol and nutrients.;Long Term Goal: Adherence to prescribed nutrition plan.             Nutrition Assessments:  Nutrition Assessments - 04/11/22 0833       Rate Your Plate Scores   Pre Score 62  MEDIFICTS Score Key: ?70 Need to make dietary changes  40-70 Heart Healthy Diet ? 40 Therapeutic Level Cholesterol Diet   Flowsheet Row CARDIAC REHAB PHASE II EXERCISE from 04/11/2022 in [facility]  Picture Your Plate Total Score on Admission 62      Picture Your Plate Scores: <40 Unhealthy dietary pattern with much room for improvement. 41-50 Dietary pattern unlikely to meet recommendations for good health and room for improvement. 51-60 More healthful dietary pattern, with some room for improvement.  >60 Healthy dietary pattern, although there may be some specific behaviors that could be improved.    Nutrition Goals Re-Evaluation:  Nutrition Goals Re-Evaluation     Row Name 04/11/22 0821 04/13/22 1037 05/13/22 0847         Goals   Current Weight 220  lb 7.4 oz (100 kg) 222 lb 0.1 oz (100.7 kg) 221 lb 12.5 oz (100.6 kg)     Comment A1c WNL A1c WNL No new labs at this time; most recent labs A1c WNL, TSH WNL     Expected Outcome Patient has previously seen outside RD on 09/03/21, 12/03/21 related to morbid obesity. She is motivated to reduce sodium intake and increase fruit and vegetable intake. Patient will benefit from participation in intensive cardiac rehab for nutrition, exercise, and lifestyle modification. Melissa Riley will benefit from participation in intensive cardiac rehab for nutrition, exercise, and lifestyle modification. Goals in action. Melissa Riley continues to attend some of the [employer] education and nutrition series. She has started making some changes including increased high fiber foods and decreased high fat foods. She continues mindfulness of her sodium intake. She is down 3.3# since starting with our program. Patient will benefit from participation in intensive cardiac rehab for nutrition, exercise, and lifestyle modification.              Nutrition Goals Re-Evaluation:  Nutrition Goals Re-Evaluation     Row Name 04/11/22 0821 04/13/22 1037 05/13/22 0847         Goals   Current Weight 220 lb 7.4 oz (100 kg) 222 lb 0.1 oz (100.7 kg) 221 lb 12.5 oz (100.6 kg)     Comment A1c WNL A1c WNL No new labs at this time; most recent labs A1c WNL, TSH WNL     Expected Outcome Patient has previously seen outside RD on 09/03/21, 12/03/21 related to morbid obesity. She is motivated to reduce sodium intake and increase fruit and vegetable intake. Patient will benefit from participation in intensive cardiac rehab for nutrition, exercise, and lifestyle modification. Melissa Riley will benefit from participation in intensive cardiac rehab for nutrition, exercise, and lifestyle modification. Goals in action. Melissa Riley continues to attend some of the [employer] education and nutrition series. She has started making some changes including increased high fiber  foods and decreased high fat foods. She continues mindfulness of her sodium intake. She is down 3.3# since starting with our program. Patient will benefit from participation in intensive cardiac rehab for nutrition, exercise, and lifestyle modification.              Nutrition Goals Discharge (Final Nutrition Goals Re-Evaluation):  Nutrition Goals Re-Evaluation - 05/13/22 0847       Goals   Current Weight 221 lb 12.5 oz (100.6 kg)    Comment No new labs at this time; most recent labs A1c WNL, TSH WNL    Expected Outcome Goals in action. Melissa Riley continues to attend some of the [employer] education and nutrition  series. She has started making some changes including increased high fiber foods and decreased high fat foods. She continues mindfulness of her sodium intake. She is down 3.3# since starting with our program. Patient will benefit from participation in intensive cardiac rehab for nutrition, exercise, and lifestyle modification.             Psychosocial: Target Goals: Acknowledge presence or absence of significant depression and/or stress, maximize coping skills, provide positive support system. Participant is able to verbalize types and ability to use techniques and skills needed for reducing stress and depression.  Initial Review & Psychosocial Screening:  Initial Psych Review & Screening - 04/06/22 1051       Initial Review   Current issues with None Identified      Family Dynamics   Good Support System? Yes   Melissa Riley has her mother, sister, and children for support     Barriers   Psychosocial barriers to participate in program There are no identifiable barriers or psychosocial needs.      Screening Interventions   Interventions Encouraged to exercise;Provide feedback about the scores to participant    Expected Outcomes Short Term goal: Identification and review with participant of any Quality of Life or Depression concerns found by scoring the questionnaire.;Long Term  goal: The participant improves quality of Life and PHQ9 Scores as seen by post scores and/or verbalization of changes             Quality of Life Scores:  Quality of Life - 05/30/22 0931       Quality of Life   Select Quality of Life      Quality of Life Scores   Health/Function Post 18.6 %    Socioeconomic Post 17 %    Psych/Spiritual Post 17 %    Family Post 20.2 %    GLOBAL Post 18.14 %            Scores of 19 and below usually indicate a poorer quality of life in these areas.  A difference of  2-3 points is a clinically meaningful difference.  A difference of 2-3 points in the total score of the Quality of Life Index has been associated with significant improvement in overall quality of life, self-image, physical symptoms, and general health in studies assessing change in quality of life.  PHQ-9: Review Flowsheet       06/01/2022 04/06/2022 02/10/2022 02/04/2021  Depression screen PHQ 2/9  Decreased Interest 0 0 0 0  Down, Depressed, Hopeless 0 0 0 1  PHQ - 2 Score 0 0 0 1  Altered sleeping 0 0 0 1  Tired, decreased energy 0 0 0 1  Change in appetite 0 0 0 0  Feeling bad or failure about yourself  0 0 0 0  Trouble concentrating 0 0 0 0  Moving slowly or fidgety/restless 0 0 0 0  Suicidal thoughts 0 0 0 0  PHQ-9 Score 0 0 0 3  Difficult doing work/chores Not difficult at all - - Not difficult at all   Interpretation of Total Score  Total Score Depression Severity:  1-4 = Minimal depression, 5-9 = Mild depression, 10-14 = Moderate depression, 15-19 = Moderately severe depression, 20-27 = Severe depression   Psychosocial Evaluation and Intervention:   Psychosocial Re-Evaluation:  Psychosocial Re-Evaluation     Row Name 04/11/22 1711 05/09/22 0818 06/01/22 1521         Psychosocial Re-Evaluation   Current issues with None Identified None Identified None Identified  Interventions Encouraged to attend Cardiac Rehabilitation for the exercise Encouraged to  attend Cardiac Rehabilitation for the exercise Encouraged to attend Cardiac Rehabilitation for the exercise     Continue Psychosocial Services  No Follow up required No Follow up required No Follow up required              Psychosocial Discharge (Final Psychosocial Re-Evaluation):  Psychosocial Re-Evaluation - 06/01/22 1521       Psychosocial Re-Evaluation   Current issues with None Identified    Interventions Encouraged to attend Cardiac Rehabilitation for the exercise    Continue Psychosocial Services  No Follow up required             Vocational Rehabilitation: Provide vocational rehab assistance to qualifying candidates.   Vocational Rehab Evaluation & Intervention:  Vocational Rehab - 04/20/22 0903       Vocational Rehab Re-Evaulation   Comments Patient turned in completed forms on 04/20/22 and formes were faxed to Vocational rehab - [facility]             Education: Education Goals: Education classes will be provided on a weekly basis, covering required topics. Participant will state understanding/return demonstration of topics presented.    Education     Row Name 04/13/22 1000     Education   Cardiac Education Topics Pritikin   Secondary school teacher School   Educator Dietitian   Weekly Topic Personalizing Your Pritikin Plate   Instruction Review Code 1- Verbalizes Understanding   Class Start Time [phone]   Class Stop Time 0848   Class Time Calculation (min) 38 min    Row Name 04/15/22 1100     Education   Cardiac Education Topics Pritikin   Nurse, children's   Educator Exercise Physiologist   Select Psychosocial   Psychosocial Healthy Minds, Bodies, Hearts   Instruction Review Code 1- Verbalizes Understanding   Class Start Time [phone]   Class Stop Time 0853   Class Time Calculation (min) 36 min    Row Name 04/18/22 0900     Education   Cardiac Education Topics Pritikin   Firefighter Nutrition   Nutrition Workshop Label Reading   Instruction Review Code 1- Verbalizes Understanding   Class Start Time 0815   Class Stop Time 0905   Class Time Calculation (min) 50 min    Row Name 04/20/22 1100     Education   Cardiac Education Topics Pritikin   Orthoptist   Educator Dietitian   Weekly Topic [employer] Desserts   Instruction Review Code 1- Verbalizes Understanding   Class Start Time 0815   Class Stop Time 0856   Class Time Calculation (min) 41 min    Row Name 04/22/22 0900     Education   Cardiac Education Topics Pritikin   Select Core Videos     Core Videos   Educator Dietitian   Select Nutrition   Nutrition Other   Instruction Review Code 1- Verbalizes Understanding   Class Start Time 0815   Class Stop Time 0900   Class Time Calculation (min) 45 min    Row Name 04/25/22 0900     Education   Cardiac Education Topics Pritikin   Select Workshops     Workshops   Educator Exercise Physiologist   Select Exercise   Exercise Workshop Exercise Basics:  Building Your Action Plan   Instruction Review Code 1- Verbalizes Understanding   Class Start Time 0815   Class Stop Time 0903   Class Time Calculation (min) 48 min    Row Name 05/09/22 1600     Education   Cardiac Education Topics Pritikin   Select Workshops     Workshops   Educator Exercise Physiologist   Select Psychosocial   Psychosocial Workshop Focused Goals, Sustainable Changes   Instruction Review Code 1- Verbalizes Understanding   Class Start Time 0810   Class Stop Time 0855   Class Time Calculation (min) 45 min    Row Name 05/20/22 0800     Education   Cardiac Education Topics Pritikin   Select Core Videos     Core Videos   Educator Dietitian   Select Nutrition   Nutrition Dining Out - Part 1   Instruction Review Code 1- Verbalizes Understanding   Class Start Time 0815   Class Stop Time 0849   Class Time Calculation  (min) 34 min    Row Name 06/01/22 1000     Education   Cardiac Education Topics Pritikin   Customer service manager   Weekly Topic [employer]- Spotlight on the [facility] Zones   Instruction Review Code 1- Verbalizes Understanding   Class Start Time [phone]   Class Stop Time [phone]   Class Time Calculation (min) 32 min            Core Videos: Exercise    Move It!  Clinical staff conducted group or individual video education with verbal and written material and guidebook.  Patient learns the recommended Pritikin exercise program. Exercise with the goal of living a long, healthy life. Some of the health benefits of exercise include controlled diabetes, healthier blood pressure levels, improved cholesterol levels, improved heart and lung capacity, improved sleep, and better body composition. Everyone should speak with their doctor before starting or changing an exercise routine.  Biomechanical Limitations Clinical staff conducted group or individual video education with verbal and written material and guidebook.  Patient learns how biomechanical limitations can impact exercise and how we can mitigate and possibly overcome limitations to have an impactful and balanced exercise routine.  Body Composition Clinical staff conducted group or individual video education with verbal and written material and guidebook.  Patient learns that body composition (ratio of muscle mass to fat mass) is a key component to assessing overall fitness, rather than body weight alone. Increased fat mass, especially visceral belly fat, can put [place] at increased risk for metabolic syndrome, type 2 diabetes, heart disease, and even death. It is recommended to combine diet and exercise (cardiovascular and resistance training) to improve your body composition. Seek guidance from your physician and exercise physiologist before implementing an exercise routine.  Exercise Action  Plan Clinical staff conducted group or individual video education with verbal and written material and guidebook.  Patient learns the recommended strategies to achieve and enjoy long-term exercise adherence, including variety, self-motivation, self-efficacy, and positive decision making. Benefits of exercise include fitness, good health, weight management, more energy, better sleep, less stress, and overall well-being.  Medical   Heart Disease Risk Reduction Clinical staff conducted group or individual video education with verbal and written material and guidebook.  Patient learns our heart is our most vital organ as it circulates oxygen, nutrients, white blood cells, and hormones throughout the entire body, and carries waste away. Data supports a plant-based  eating plan like the Pritikin Program for its effectiveness in slowing progression of and reversing heart disease. The video provides a number of recommendations to address heart disease.   Metabolic Syndrome and Belly Fat  Clinical staff conducted group or individual video education with verbal and written material and guidebook.  Patient learns what metabolic syndrome is, how it leads to heart disease, and how one can reverse it and keep it from coming back. You have metabolic syndrome if you have 3 of the following 5 criteria: abdominal obesity, high blood pressure, high triglycerides, low HDL cholesterol, and high blood sugar.  Hypertension and Heart Disease Clinical staff conducted group or individual video education with verbal and written material and guidebook.  Patient learns that high blood pressure, or hypertension, is very common in the [place]. Hypertension is largely due to excessive salt intake, but other important risk factors include being overweight, physical inactivity, drinking too much alcohol, smoking, and not eating enough potassium from fruits and vegetables. High blood pressure is a leading risk factor for heart  attack, stroke, congestive heart failure, dementia, kidney failure, and premature death. Long-term effects of excessive salt intake include stiffening of the arteries and thickening of heart muscle and organ damage. Recommendations include ways to reduce hypertension and the risk of heart disease.  Diseases of Our Time - Focusing on Diabetes Clinical staff conducted group or individual video education with verbal and written material and guidebook.  Patient learns why the best way to stop diseases of our time is prevention, through food and other lifestyle changes. Medicine (such as prescription pills and surgeries) is often only a Band-Aid on the problem, not a long-term solution. Most common diseases of our time include obesity, type 2 diabetes, hypertension, heart disease, and cancer. The Pritikin Program is recommended and has been proven to help reduce, reverse, and/or prevent the damaging effects of metabolic syndrome.  Nutrition   Overview of the Pritikin Eating Plan  Clinical staff conducted group or individual video education with verbal and written material and guidebook.  Patient learns about the Pritikin Eating Plan for disease risk reduction. The Pritikin Eating Plan emphasizes a wide variety of unrefined, minimally-processed carbohydrates, like fruits, vegetables, whole grains, and legumes. Go, Caution, and Stop food choices are explained. Plant-based and lean animal proteins are emphasized. Rationale provided for low sodium intake for blood pressure control, low added sugars for blood sugar stabilization, and low added fats and oils for coronary artery disease risk reduction and weight management.  Calorie Density  Clinical staff conducted group or individual video education with verbal and written material and guidebook.  Patient learns about calorie density and how it impacts the Pritikin Eating Plan. Knowing the characteristics of the food you choose will help you decide whether those  foods will lead to weight gain or weight loss, and whether you want to consume more or less of them. Weight loss is usually a side effect of the Pritikin Eating Plan because of its focus on low calorie-dense foods.  Label Reading  Clinical staff conducted group or individual video education with verbal and written material and guidebook.  Patient learns about the Pritikin recommended label reading guidelines and corresponding recommendations regarding calorie density, added sugars, sodium content, and whole grains.  Dining Out - Part 1  Clinical staff conducted group or individual video education with verbal and written material and guidebook.  Patient learns that restaurant meals can be sabotaging because they can be so high in calories, fat, sodium,  and/or sugar. Patient learns recommended strategies on how to positively address this and avoid unhealthy pitfalls.  Facts on Fats  Clinical staff conducted group or individual video education with verbal and written material and guidebook.  Patient learns that lifestyle modifications can be just as effective, if not more so, as many medications for lowering your risk of heart disease. A Pritikin lifestyle can help to reduce your risk of inflammation and atherosclerosis (cholesterol build-up, or plaque, in the artery walls). Lifestyle interventions such as dietary choices and physical activity address the cause of atherosclerosis. A review of the types of fats and their impact on blood cholesterol levels, along with dietary recommendations to reduce fat intake is also included.  Nutrition Action Plan  Clinical staff conducted group or individual video education with verbal and written material and guidebook.  Patient learns how to incorporate Pritikin recommendations into their lifestyle. Recommendations include planning and keeping personal health goals in mind as an important part of their success.  Healthy Mind-Set    Healthy Minds, Bodies, Hearts   Clinical staff conducted group or individual video education with verbal and written material and guidebook.  Patient learns how to identify when they are stressed. Video will discuss the impact of that stress, as well as the many benefits of stress management. Patient will also be introduced to stress management techniques. The way we think, act, and feel has an impact on our hearts.  How Our Thoughts Can Heal Our Hearts  Clinical staff conducted group or individual video education with verbal and written material and guidebook.  Patient learns that negative thoughts can cause depression and anxiety. This can result in negative lifestyle behavior and serious health problems. Cognitive behavioral therapy is an effective method to help control our thoughts in order to change and improve our emotional outlook.  Additional Videos:  Exercise    Improving Performance  Clinical staff conducted group or individual video education with verbal and written material and guidebook.  Patient learns to use a non-linear approach by alternating intensity levels and lengths of time spent exercising to help burn more calories and lose more body fat. Cardiovascular exercise helps improve heart health, metabolism, hormonal balance, blood sugar control, and recovery from fatigue. Resistance training improves strength, endurance, balance, coordination, reaction time, metabolism, and muscle mass. Flexibility exercise improves circulation, posture, and balance. Seek guidance from your physician and exercise physiologist before implementing an exercise routine and learn your capabilities and proper form for all exercise.  Introduction to Yoga  Clinical staff conducted group or individual video education with verbal and written material and guidebook.  Patient learns about yoga, a discipline of the coming together of mind, breath, and body. The benefits of yoga include improved flexibility, improved range of motion, better  posture and core strength, increased lung function, weight loss, and positive self-image. Yoga's heart health benefits include lowered blood pressure, healthier heart rate, decreased cholesterol and triglyceride levels, improved immune function, and reduced stress. Seek guidance from your physician and exercise physiologist before implementing an exercise routine and learn your capabilities and proper form for all exercise.  Medical   Aging: Enhancing Your Quality of Life  Clinical staff conducted group or individual video education with verbal and written material and guidebook.  Patient learns key strategies and recommendations to stay in good physical health and enhance quality of life, such as prevention strategies, having an advocate, securing a Health Care Proxy and Power of Attorney, and keeping a list of medications and system for  tracking them. It also discusses how to avoid risk for bone loss.  Biology of Weight Control  Clinical staff conducted group or individual video education with verbal and written material and guidebook.  Patient learns that weight gain occurs because we consume more calories than we burn (eating more, moving less). Even if your body weight is normal, you may have higher ratios of fat compared to muscle mass. Too much body fat puts you at increased risk for cardiovascular disease, heart attack, stroke, type 2 diabetes, and obesity-related cancers. In addition to exercise, following the Pritikin Eating Plan can help reduce your risk.  Decoding Lab Results  Clinical staff conducted group or individual video education with verbal and written material and guidebook.  Patient learns that lab test reflects one measurement whose values change over time and are influenced by many factors, including medication, stress, sleep, exercise, food, hydration, pre-existing medical conditions, and more. It is recommended to use the knowledge from this video to become more involved with  your lab results and evaluate your numbers to speak with your doctor.   Diseases of Our Time - Overview  Clinical staff conducted group or individual video education with verbal and written material and guidebook.  Patient learns that according to the CDC, 50% to 70% of chronic diseases (such as obesity, type 2 diabetes, elevated lipids, hypertension, and heart disease) are avoidable through lifestyle improvements including healthier food choices, listening to satiety cues, and increased physical activity.  Sleep Disorders Clinical staff conducted group or individual video education with verbal and written material and guidebook.  Patient learns how good quality and duration of sleep are important to overall health and well-being. Patient also learns about sleep disorders and how they impact health along with recommendations to address them, including discussing with a physician.  Nutrition  Dining Out - Part 2 Clinical staff conducted group or individual video education with verbal and written material and guidebook.  Patient learns how to plan ahead and communicate in order to maximize their dining experience in a healthy and nutritious manner. Included are recommended food choices based on the type of restaurant the patient is visiting.   Fueling a Banker conducted group or individual video education with verbal and written material and guidebook.  There is a strong connection between our food choices and our health. Diseases like obesity and type 2 diabetes are very prevalent and are in large-part due to lifestyle choices. The Pritikin Eating Plan provides plenty of food and hunger-curbing satisfaction. It is easy to follow, affordable, and helps reduce health risks.  Menu Workshop  Clinical staff conducted group or individual video education with verbal and written material and guidebook.  Patient learns that restaurant meals can sabotage health goals because they are  often packed with calories, fat, sodium, and sugar. Recommendations include strategies to plan ahead and to communicate with the manager, chef, or server to help order a healthier meal.  Planning Your Eating Strategy  Clinical staff conducted group or individual video education with verbal and written material and guidebook.  Patient learns about the Pritikin Eating Plan and its benefit of reducing the risk of disease. The Pritikin Eating Plan does not focus on calories. Instead, it emphasizes high-quality, nutrient-rich foods. By knowing the characteristics of the foods, we choose, we can determine their calorie density and make informed decisions.  Targeting Your Nutrition Priorities  Clinical staff conducted group or individual video education with verbal and written material and guidebook.  Patient learns that lifestyle habits have a tremendous impact on disease risk and progression. This video provides eating and physical activity recommendations based on your personal health goals, such as reducing LDL cholesterol, losing weight, preventing or controlling type 2 diabetes, and reducing high blood pressure.  Vitamins and Minerals  Clinical staff conducted group or individual video education with verbal and written material and guidebook.  Patient learns different ways to obtain key vitamins and minerals, including through a recommended healthy diet. It is important to discuss all supplements you take with your doctor.   Healthy Mind-Set    Smoking Cessation  Clinical staff conducted group or individual video education with verbal and written material and guidebook.  Patient learns that cigarette smoking and tobacco addiction pose a serious health risk which affects millions of people. Stopping smoking will significantly reduce the risk of heart disease, lung disease, and many forms of cancer. Recommended strategies for quitting are covered, including working with your doctor to develop a  successful plan.  Culinary   Becoming a Set designer conducted group or individual video education with verbal and written material and guidebook.  Patient learns that cooking at home can be healthy, cost-effective, quick, and puts them in control. Keys to cooking healthy recipes will include looking at your recipe, assessing your equipment needs, planning ahead, making it simple, choosing cost-effective seasonal ingredients, and limiting the use of added fats, salts, and sugars.  Cooking - Breakfast and Snacks  Clinical staff conducted group or individual video education with verbal and written material and guidebook.  Patient learns how important breakfast is to satiety and nutrition through the entire day. Recommendations include key foods to eat during breakfast to help stabilize blood sugar levels and to prevent overeating at meals later in the day. Planning ahead is also a key component.  Cooking - Educational psychologist conducted group or individual video education with verbal and written material and guidebook.  Patient learns eating strategies to improve overall health, including an approach to cook more at home. Recommendations include thinking of animal protein as a side on your plate rather than center stage and focusing instead on lower calorie dense options like vegetables, fruits, whole grains, and plant-based proteins, such as beans. Making sauces in large quantities to freeze for later and leaving the skin on your vegetables are also recommended to maximize your experience.  Cooking - Healthy Salads and Dressing Clinical staff conducted group or individual video education with verbal and written material and guidebook.  Patient learns that vegetables, fruits, whole grains, and legumes are the foundations of the Pritikin Eating Plan. Recommendations include how to incorporate each of these in flavorful and healthy salads, and how to create homemade salad  dressings. Proper handling of ingredients is also covered. Cooking - Soups and [employer] - Soups and Desserts Clinical staff conducted group or individual video education with verbal and written material and guidebook.  Patient learns that Pritikin soups and desserts make for easy, nutritious, and delicious snacks and meal components that are low in sodium, fat, sugar, and calorie density, while high in vitamins, minerals, and filling fiber. Recommendations include simple and healthy ideas for soups and desserts.   Overview     The Pritikin Solution Program Overview Clinical staff conducted group or individual video education with verbal and written material and guidebook.  Patient learns that the results of the Pritikin Program have been documented in more than 100 articles published in  peer-reviewed journals, and the benefits include reducing risk factors for (and, in some cases, even reversing) high cholesterol, high blood pressure, type 2 diabetes, obesity, and more! An overview of the three key pillars of the Pritikin Program will be covered: eating well, doing regular exercise, and having a healthy mind-set.  WORKSHOPS  Exercise: Exercise Basics: Building Your Action Plan Clinical staff led group instruction and group discussion with PowerPoint presentation and patient guidebook. To enhance the learning environment the use of posters, models and videos may be added. At the conclusion of this workshop, patients will comprehend the difference between physical activity and exercise, as well as the benefits of incorporating both, into their routine. Patients will understand the FITT (Frequency, Intensity, Time, and Type) principle and how to use it to build an exercise action plan. In addition, safety concerns and other considerations for exercise and cardiac rehab will be addressed by the presenter. The purpose of this lesson is to promote a comprehensive and effective weekly exercise  routine in order to improve patients' overall level of fitness.   Managing Heart Disease: Your Path to a Healthier Heart Clinical staff led group instruction and group discussion with PowerPoint presentation and patient guidebook. To enhance the learning environment the use of posters, models and videos may be added.At the conclusion of this workshop, patients will understand the anatomy and physiology of the heart. Additionally, they will understand how Pritikin's three pillars impact the risk factors, the progression, and the management of heart disease.  The purpose of this lesson is to provide a high-level overview of the heart, heart disease, and how the Pritikin lifestyle positively impacts risk factors.  Exercise Biomechanics Clinical staff led group instruction and group discussion with PowerPoint presentation and patient guidebook. To enhance the learning environment the use of posters, models and videos may be added. Patients will learn how the structural parts of their bodies function and how these functions impact their daily activities, movement, and exercise. Patients will learn how to promote a neutral spine, learn how to manage pain, and identify ways to improve their physical movement in order to promote healthy living. The purpose of this lesson is to expose patients to common physical limitations that impact physical activity. Participants will learn practical ways to adapt and manage aches and pains, and to minimize their effect on regular exercise. Patients will learn how to maintain good posture while sitting, walking, and lifting.  Balance Training and Fall Prevention  Clinical staff led group instruction and group discussion with PowerPoint presentation and patient guidebook. To enhance the learning environment the use of posters, models and videos may be added. At the conclusion of this workshop, patients will understand the importance of their sensorimotor skills  (vision, proprioception, and the vestibular system) in maintaining their ability to balance as they age. Patients will apply a variety of balancing exercises that are appropriate for their current level of function. Patients will understand the common causes for poor balance, possible solutions to these problems, and ways to modify their physical environment in order to minimize their fall risk. The purpose of this lesson is to teach patients about the importance of maintaining balance as they age and ways to minimize their risk of falling.  WORKSHOPS   Nutrition:  Fueling a Ship broker led group instruction and group discussion with PowerPoint presentation and patient guidebook. To enhance the learning environment the use of posters, models and videos may be added. Patients will review the foundational principles of the  Pritikin Eating Plan and understand what constitutes a serving size in each of the food groups. Patients will also learn Pritikin-friendly foods that are better choices when away from home and review make-ahead meal and snack options. Calorie density will be reviewed and applied to three nutrition priorities: weight maintenance, weight loss, and weight gain. The purpose of this lesson is to reinforce (in a group setting) the key concepts around what patients are recommended to eat and how to apply these guidelines when away from home by planning and selecting Pritikin-friendly options. Patients will understand how calorie density may be adjusted for different weight management goals.  Mindful Eating  Clinical staff led group instruction and group discussion with PowerPoint presentation and patient guidebook. To enhance the learning environment the use of posters, models and videos may be added. Patients will briefly review the concepts of the Pritikin Eating Plan and the importance of low-calorie dense foods. The concept of mindful eating will be introduced as well as the  importance of paying attention to internal hunger signals. Triggers for non-hunger eating and techniques for dealing with triggers will be explored. The purpose of this lesson is to provide patients with the opportunity to review the basic principles of the Pritikin Eating Plan, discuss the value of eating mindfully and how to measure internal cues of hunger and fullness using the Hunger Scale. Patients will also discuss reasons for non-hunger eating and learn strategies to use for controlling emotional eating.  Targeting Your Nutrition Priorities Clinical staff led group instruction and group discussion with PowerPoint presentation and patient guidebook. To enhance the learning environment the use of posters, models and videos may be added. Patients will learn how to determine their genetic susceptibility to disease by reviewing their family history. Patients will gain insight into the importance of diet as part of an overall healthy lifestyle in mitigating the impact of genetics and other environmental insults. The purpose of this lesson is to provide patients with the opportunity to assess their personal nutrition priorities by looking at their family history, their own health history and current risk factors. Patients will also be able to discuss ways of prioritizing and modifying the Pritikin Eating Plan for their highest risk areas  Menu  Clinical staff led group instruction and group discussion with PowerPoint presentation and patient guidebook. To enhance the learning environment the use of posters, models and videos may be added. Using menus brought in from [employer], or printed from [employer], patients will apply the Pritikin dining out guidelines that were presented in the [employer] video. Patients will also be able to practice these guidelines in a variety of provided scenarios. The purpose of this lesson is to provide patients with the opportunity to practice  hands-on learning of the Pritikin Dining Out guidelines with actual menus and practice scenarios.  Label Reading Clinical staff led group instruction and group discussion with PowerPoint presentation and patient guidebook. To enhance the learning environment the use of posters, models and videos may be added. Patients will review and discuss the Pritikin label reading guidelines presented in Pritikin's Label Reading Educational series video. Using fool labels brought in from local grocery stores and markets, patients will apply the label reading guidelines and determine if the packaged food meet the Pritikin guidelines. The purpose of this lesson is to provide patients with the opportunity to review, discuss, and practice hands-on learning of the Pritikin Label Reading guidelines with actual packaged food labels. Cooking School  [employer]  Workshops are designed to teach patients ways to prepare quick, simple, and affordable recipes at home. The importance of nutrition's role in chronic disease risk reduction is reflected in its emphasis in the overall Pritikin program. By learning how to prepare essential core Pritikin Eating Plan recipes, patients will increase control over what they eat; be able to customize the flavor of foods without the use of added salt, sugar, or fat; and improve the quality of the food they consume. By learning a set of core recipes which are easily assembled, quickly prepared, and affordable, patients are more likely to prepare more healthy foods at home. These workshops focus on convenient breakfasts, simple entres, side dishes, and desserts which can be prepared with minimal effort and are consistent with nutrition recommendations for cardiovascular risk reduction. Cooking [employer] are taught by a Armed forces logistics/support/administrative officer (RD) who has been trained by the [employer]. The chef or RD has a clear understanding of the importance of minimizing -  if not completely eliminating - added fat, sugar, and sodium in recipes. Throughout the series of Cooking School Workshop sessions, patients will learn about healthy ingredients and efficient methods of cooking to build confidence in their capability to prepare    Cooking School weekly topics:  Adding Flavor- Sodium-Free  Fast and Healthy Breakfasts  Powerhouse Plant-Based Proteins  Satisfying Salads and Dressings  Simple Sides and Sauces  International Cuisine-Spotlight on the [employer] Zones  Delicious Desserts  Savory Soups  [employer] - Meals in a Astronomer Appetizers and Snacks  Comforting Weekend Breakfasts  One-Pot Wonders   Fast Evening Meals  Landscape architect Your Pritikin Plate  WORKSHOPS   Healthy Mindset (Psychosocial):  Focused Goals, Sustainable Changes Clinical staff led group instruction and group discussion with PowerPoint presentation and patient guidebook. To enhance the learning environment the use of posters, models and videos may be added. Patients will be able to apply effective goal setting strategies to establish at least one personal goal, and then take consistent, meaningful action toward that goal. They will learn to identify common barriers to achieving personal goals and develop strategies to overcome them. Patients will also gain an understanding of how our mind-set can impact our ability to achieve goals and the importance of cultivating a positive and growth-oriented mind-set. The purpose of this lesson is to provide patients with a deeper understanding of how to set and achieve personal goals, as well as the tools and strategies needed to overcome common obstacles which may arise along the way.  From Head to Heart: The Power of a Healthy Outlook  Clinical staff led group instruction and group discussion with PowerPoint presentation and patient guidebook. To enhance the learning environment the use of posters, models and videos may be  added. Patients will be able to recognize and describe the impact of emotions and mood on physical health. They will discover the importance of self-care and explore self-care practices which may work for them. Patients will also learn how to utilize the 4 C's to cultivate a healthier outlook and better manage stress and challenges. The purpose of this lesson is to demonstrate to patients how a healthy outlook is an essential part of maintaining good health, especially as they continue their cardiac rehab journey.  Healthy Sleep for a Healthy Heart Clinical staff led group instruction and group discussion with PowerPoint presentation and patient guidebook. To enhance the learning environment the use of posters, models and videos may be added.  At the conclusion of this workshop, patients will be able to demonstrate knowledge of the importance of sleep to overall health, well-being, and quality of life. They will understand the symptoms of, and treatments for, common sleep disorders. Patients will also be able to identify daytime and nighttime behaviors which impact sleep, and they will be able to apply these tools to help manage sleep-related challenges. The purpose of this lesson is to provide patients with a general overview of sleep and outline the importance of quality sleep. Patients will learn about a few of the most common sleep disorders. Patients will also be introduced to the concept of "sleep hygiene," and discover ways to self-manage certain sleeping problems through simple daily behavior changes. Finally, the workshop will motivate patients by clarifying the links between quality sleep and their goals of heart-healthy living.   Recognizing and Reducing Stress Clinical staff led group instruction and group discussion with PowerPoint presentation and patient guidebook. To enhance the learning environment the use of posters, models and videos may be added. At the conclusion of this workshop, patients  will be able to understand the types of stress reactions, differentiate between acute and chronic stress, and recognize the impact that chronic stress has on their health. They will also be able to apply different coping mechanisms, such as reframing negative self-talk. Patients will have the opportunity to practice a variety of stress management techniques, such as deep abdominal breathing, progressive muscle relaxation, and/or guided imagery.  The purpose of this lesson is to educate patients on the role of stress in their lives and to provide healthy techniques for coping with it.  Learning Barriers/Preferences:  Learning Barriers/Preferences - 04/06/22 1158       Learning Barriers/Preferences   Learning Barriers Sight   wears glasses   Learning Preferences Audio;Computer/Internet;Group Instruction;Individual Instruction;Skilled Demonstration;Pictoral;Verbal Instruction;Video;Written Material             Education Topics:  Knowledge Questionnaire Score:  Knowledge Questionnaire Score - 05/30/22 0931       Knowledge Questionnaire Score   Post Score 19/24             Core Components/Risk Factors/Patient Goals at Admission:  Personal Goals and Risk Factors at Admission - 04/06/22 1152       Core Components/Risk Factors/Patient Goals on Admission    Weight Management Yes;Obesity;Weight Loss    Intervention Weight Management: Provide education and appropriate resources to help participant work on and attain dietary goals.;Weight Management: Develop a combined nutrition and exercise program designed to reach desired caloric intake, while maintaining appropriate intake of nutrient and fiber, sodium and fats, and appropriate energy expenditure required for the weight goal.;Weight Management/Obesity: Establish reasonable short term and long term weight goals.;Obesity: Provide education and appropriate resources to help participant work on and attain dietary goals.    Admit Weight 225  lb (102.1 kg)    Expected Outcomes Short Term: Continue to assess and modify interventions until short term weight is achieved;Long Term: Adherence to nutrition and physical activity/exercise program aimed toward attainment of established weight goal;Weight Loss: Understanding of general recommendations for a balanced deficit meal plan, which promotes 1-2 lb weight loss per week and includes a negative energy balance of [phone] kcal/d;Understanding recommendations for meals to include 15-35% energy as protein, 25-35% energy from fat, 35-60% energy from carbohydrates, less than 200mg  of dietary cholesterol, 20-35 gm of total fiber daily;Understanding of distribution of calorie intake throughout the day with the consumption of 4-5 meals/snacks    Hypertension Yes  Intervention Provide education on lifestyle modifcations including regular physical activity/exercise, weight management, moderate sodium restriction and increased consumption of fresh fruit, vegetables, and low fat dairy, alcohol moderation, and smoking cessation.;Monitor prescription use compliance.    Expected Outcomes Short Term: Continued assessment and intervention until BP is < 140/23mm HG in hypertensive participants. < 130/26mm HG in hypertensive participants with diabetes, heart failure or chronic kidney disease.;Long Term: Maintenance of blood pressure at goal levels.             Core Components/Risk Factors/Patient Goals Review:   Goals and Risk Factor Review     Row Name 04/11/22 1713 05/09/22 0818 06/01/22 1522         Core Components/Risk Factors/Patient Goals Review   Personal Goals Review Weight Management/Obesity;Hypertension Weight Management/Obesity;Hypertension Weight Management/Obesity;Hypertension     Review Melissa Riley started intensive cardiac rehab on 04/11/22. Melissa Riley did well with exercise for her fitness level. Will continue to monitor bP Melissa Riley is doing well with exercise at intensive cardiac rehab. Vital  signs have been stable Melissa Riley is doing well with exercise at intensive cardiac rehab. Vital signs have been stable. Melissa Riley has lost 3.6 kg since starting the program     Expected Outcomes Melissa Riley will continue to participate in intensive cardiac rehab for exercise, nutrition and lifestyle modifications Melissa Riley will continue to participate in intensive cardiac rehab for exercise, nutrition and lifestyle modifications Melissa Riley will continue to participate in intensive cardiac rehab for exercise, nutrition and lifestyle modifications              Core Components/Risk Factors/Patient Goals at Discharge (Final Review):   Goals and Risk Factor Review - 06/01/22 1522       Core Components/Risk Factors/Patient Goals Review   Personal Goals Review Weight Management/Obesity;Hypertension    Review Melissa Riley is doing well with exercise at intensive cardiac rehab. Vital signs have been stable. Melissa Riley has lost 3.6 kg since starting the program    Expected Outcomes Melissa Riley will continue to participate in intensive cardiac rehab for exercise, nutrition and lifestyle modifications             ITP Comments:  ITP Comments     Row Name 04/06/22 1478 04/11/22 1711 05/09/22 0817 06/01/22 1520     ITP Comments Dr. Armanda Magic medical director. Introduction to pritikin education program/ intesive cardiac rehab. Initial orientation packet reviewed with patient. 30 Day ITP Review. Melissa Riley started intensive cardiac rehab on 03/1822/ and did well with exercise 30 Day ITP Comments. Melissa Riley has good attendance and participation in intensive cardiac rehab 30 Day ITP Comments. Melissa Riley has good attendance and participation in intensive cardiac rehab. Melissa Riley will complete intensive cardiac rehab on 06/03/22             Comments: See ITP comments.Thayer Headings RN BSN

## 2022-06-03 ENCOUNTER — Encounter (HOSPITAL_COMMUNITY): Payer: Medicaid Other

## 2022-06-03 ENCOUNTER — Encounter (HOSPITAL_COMMUNITY)
Admission: RE | Admit: 2022-06-03 | Discharge: 2022-06-03 | Disposition: A | Discharge status: Home / Self Care | Payer: Medicaid Other | Source: Ambulatory Visit | Attending: Cardiovascular Disease | Admitting: Cardiovascular Disease

## 2022-06-03 DIAGNOSIS — Z9889 Other specified postprocedural states: Secondary | ICD-10-CM

## 2022-06-06 NOTE — Progress Notes (Signed)
Discharge Progress Report  Patient Details  Name: Melissa Riley MRN: [id number] Date of Birth: [birth year] Referring Provider:   Flowsheet Row CARDIAC REHAB PHASE II ORIENTATION from 04/06/2022 in [facility]  Referring Provider Lennie Odor, MD        Number of Visits: 35  Reason for Discharge:  Patient reached a stable level of exercise. Patient independent in their exercise. Patient has met program and personal goals.  Smoking History:  Social History   Tobacco Use  Smoking Status Never  Smokeless Tobacco Never    Diagnosis:  S/P mitral valve repair  ADL UCSD:   Initial Exercise Prescription:  Initial Exercise Prescription - 04/06/22 1000       Date of Initial Exercise RX and Referring Provider   Date 04/06/22    Referring Provider Lennie Odor, MD    Expected Discharge Date 06/03/22      NuStep   Level 1    SPM 60    Minutes 15    METs 2      Track   Laps 10   big track   Minutes 15    METs 2      Prescription Details   Frequency (times per week) 3    Duration Progress to 30 minutes of continuous aerobic without signs/symptoms of physical distress      Intensity   THRR 40-80% of Max Heartrate 66-132    Ratings of Perceived Exertion 11-13    Perceived Dyspnea 0-4      Progression   Progression Continue progressive overload as per policy without signs/symptoms or physical distress.      Resistance Training   Training Prescription Yes    Weight 2    Reps 10-15             Discharge Exercise Prescription (Final Exercise Prescription Changes):  Exercise Prescription Changes - 06/03/22 0830       Response to Exercise   Blood Pressure (Admit) 120/60    Blood Pressure (Exercise) 148/96    Blood Pressure (Exit) 138/86    Heart Rate (Admit) 80 bpm    Heart Rate (Exercise) 120 bpm    Heart Rate (Exit) 89 bpm    Rating of Perceived Exertion (Exercise) 10.5    Perceived  Dyspnea (Exercise) 0    Symptoms None    Comments Pt graduated the CRP2 program    Duration Continue with 30 min of aerobic exercise without signs/symptoms of physical distress.    Intensity THRR unchanged      Progression   Progression Continue to progress workloads to maintain intensity without signs/symptoms of physical distress.    Average METs 3.18      Resistance Training   Training Prescription Yes    Weight 2    Reps 10-15    Time 10 Minutes      NuStep   Level 5    SPM 137    Minutes 15    METs 3.7      Track   Laps 13    Minutes 15    METs 2.66      Home Exercise Plan   Plans to continue exercise at Home (comment)    Frequency Add 4 additional days to program exercise sessions.    Initial Home Exercises Provided 04/27/22             Functional Capacity:  6 Minute Walk     Row Name 04/06/22  1155 06/01/22 0919       6 Minute Walk   Phase Initial Discharge    Distance 1021 feet 1210 feet    Distance % Change -- 18.51 %    Distance Feet Change -- 189 ft    Walk Time 6 minutes 6 minutes    # of Rest Breaks 0 0    MPH 1.9 2.29    METS 2.56 3.06    RPE 11 10    Perceived Dyspnea  0 0    VO2 Peak 8.94 10.7    Symptoms No No    Resting HR 70 bpm 82 bpm    Resting BP 136/82 110/84    Resting Oxygen Saturation  98 % --    Exercise Oxygen Saturation  during 6 min walk 98 % --    Max Ex. HR 103 bpm 114 bpm    Max Ex. BP 140/86 138/80    2 Minute Post BP 126/80 --             Psychological, QOL, Others - Outcomes: PHQ 2/9:    06/01/2022    8:28 AM 04/06/2022   10:49 AM 02/10/2022    8:45 AM 02/04/2021    3:35 PM  Depression screen PHQ 2/9  Decreased Interest 0 0 0 0  Down, Depressed, Hopeless 0 0 0 1  PHQ - 2 Score 0 0 0 1  Altered sleeping 0 0 0 1  Tired, decreased energy 0 0 0 1  Change in appetite 0 0 0 0  Feeling bad or failure about yourself  0 0 0 0  Trouble concentrating 0 0 0 0  Moving slowly or fidgety/restless 0 0 0 0  Suicidal  thoughts 0 0 0 0  PHQ-9 Score 0 0 0 3  Difficult doing work/chores Not difficult at all   Not difficult at all    Quality of Life:  Quality of Life - 05/30/22 0931       Quality of Life   Select Quality of Life      Quality of Life Scores   Health/Function Post 18.6 %    Socioeconomic Post 17 %    Psych/Spiritual Post 17 %    Family Post 20.2 %    GLOBAL Post 18.14 %             Personal Goals: Goals established at orientation with interventions provided to work toward goal.  Personal Goals and Risk Factors at Admission - 04/06/22 1152       Core Components/Risk Factors/Patient Goals on Admission    Weight Management Yes;Obesity;Weight Loss    Intervention Weight Management: Provide education and appropriate resources to help participant work on and attain dietary goals.;Weight Management: Develop a combined nutrition and exercise program designed to reach desired caloric intake, while maintaining appropriate intake of nutrient and fiber, sodium and fats, and appropriate energy expenditure required for the weight goal.;Weight Management/Obesity: Establish reasonable short term and long term weight goals.;Obesity: Provide education and appropriate resources to help participant work on and attain dietary goals.    Admit Weight 225 lb (102.1 kg)    Expected Outcomes Short Term: Continue to assess and modify interventions until short term weight is achieved;Long Term: Adherence to nutrition and physical activity/exercise program aimed toward attainment of established weight goal;Weight Loss: Understanding of general recommendations for a balanced deficit meal plan, which promotes 1-2 lb weight loss per week and includes a negative energy balance of [phone] kcal/d;Understanding recommendations for meals to  include 15-35% energy as protein, 25-35% energy from fat, 35-60% energy from carbohydrates, less than 200mg  of dietary cholesterol, 20-35 gm of total fiber daily;Understanding of  distribution of calorie intake throughout the day with the consumption of 4-5 meals/snacks    Hypertension Yes    Intervention Provide education on lifestyle modifcations including regular physical activity/exercise, weight management, moderate sodium restriction and increased consumption of fresh fruit, vegetables, and low fat dairy, alcohol moderation, and smoking cessation.;Monitor prescription use compliance.    Expected Outcomes Short Term: Continued assessment and intervention until BP is < 140/61mm HG in hypertensive participants. < 130/48mm HG in hypertensive participants with diabetes, heart failure or chronic kidney disease.;Long Term: Maintenance of blood pressure at goal levels.              Personal Goals Discharge:  Goals and Risk Factor Review     Row Name 04/11/22 1713 05/09/22 0818 06/01/22 1522         Core Components/Risk Factors/Patient Goals Review   Personal Goals Review Weight Management/Obesity;Hypertension Weight Management/Obesity;Hypertension Weight Management/Obesity;Hypertension     Review Melissa Riley started intensive cardiac rehab on 04/11/22. Melissa Riley did well with exercise for her fitness level. Will continue to monitor bP Melissa Riley is doing well with exercise at intensive cardiac rehab. Vital signs have been stable Melissa Riley is doing well with exercise at intensive cardiac rehab. Vital signs have been stable. Melissa Riley has lost 3.6 kg since starting the program     Expected Outcomes Melissa Riley will continue to participate in intensive cardiac rehab for exercise, nutrition and lifestyle modifications Melissa Riley will continue to participate in intensive cardiac rehab for exercise, nutrition and lifestyle modifications Melissa Riley will continue to participate in intensive cardiac rehab for exercise, nutrition and lifestyle modifications              Exercise Goals and Review:  Exercise Goals     Row Name 04/06/22 1050             Exercise Goals   Increase  Physical Activity Yes       Intervention Provide advice, education, support and counseling about physical activity/exercise needs.;Develop an individualized exercise prescription for aerobic and resistive training based on initial evaluation findings, risk stratification, comorbidities and participant's personal goals.       Expected Outcomes Short Term: Attend rehab on a regular basis to increase amount of physical activity.;Long Term: Exercising regularly at least 3-5 days a week.;Long Term: Add in home exercise to make exercise part of routine and to increase amount of physical activity.       Increase Strength and Stamina Yes       Intervention Provide advice, education, support and counseling about physical activity/exercise needs.;Develop an individualized exercise prescription for aerobic and resistive training based on initial evaluation findings, risk stratification, comorbidities and participant's personal goals.       Expected Outcomes Short Term: Increase workloads from initial exercise prescription for resistance, speed, and METs.;Short Term: Perform resistance training exercises routinely during rehab and add in resistance training at home;Long Term: Improve cardiorespiratory fitness, muscular endurance and strength as measured by increased METs and functional capacity ( )       Able to understand and use rate of perceived exertion (RPE) scale Yes       Intervention Provide education and explanation on how to use RPE scale       Expected Outcomes Short Term: Able to use RPE daily in rehab to express subjective intensity level;Long Term:  Able to use RPE to  guide intensity level when exercising independently       Knowledge and understanding of Target Heart Rate Range (THRR) Yes       Intervention Provide education and explanation of THRR including how the numbers were predicted and where they are located for reference       Expected Outcomes Short Term: Able to state/look up THRR;Long  Term: Able to use THRR to govern intensity when exercising independently;Short Term: Able to use daily as guideline for intensity in rehab       Understanding of Exercise Prescription Yes       Intervention Provide education, explanation, and written materials on patient's individual exercise prescription       Expected Outcomes Short Term: Able to explain program exercise prescription;Long Term: Able to explain home exercise prescription to exercise independently                Exercise Goals Re-Evaluation:  Exercise Goals Re-Evaluation     Row Name 04/11/22 0834 04/25/22 0940 04/27/22 0831 05/25/22 0829 06/03/22 1337     Exercise Goal Re-Evaluation   Exercise Goals Review Increase Physical Activity;Understanding of Exercise Prescription;Increase Strength and Stamina;Knowledge and understanding of Target Heart Rate Range (THRR);Able to understand and use rate of perceived exertion (RPE) scale Increase Physical Activity;Understanding of Exercise Prescription;Increase Strength and Stamina;Knowledge and understanding of Target Heart Rate Range (THRR);Able to understand and use rate of perceived exertion (RPE) scale Increase Physical Activity;Understanding of Exercise Prescription;Increase Strength and Stamina;Knowledge and understanding of Target Heart Rate Range (THRR);Able to understand and use rate of perceived exertion (RPE) scale Increase Physical Activity;Understanding of Exercise Prescription;Increase Strength and Stamina;Knowledge and understanding of Target Heart Rate Range (THRR);Able to understand and use rate of perceived exertion (RPE) scale Increase Physical Activity;Understanding of Exercise Prescription;Increase Strength and Stamina;Knowledge and understanding of Target Heart Rate Range (THRR);Able to understand and use rate of perceived exertion (RPE) scale   Comments Pt first day in CRP2 program. Pt tolerated exercise well with an average MET level of 2.09. Pt is learning her THRR,  ExRx, and RPE scale. Pt is off to a great start, really enjoys the nustep and said it makes her knees feel better. Reviewed MET's and goals. Pt tolerated exercise well with an average MET level of 2.42. Pt feels good about her goals of gaining strength and stamina and living a healthier lifestyle. Pt continues to work on wt loss, but has adopted many good habids from out RD and feels like she has the resources she needs to succeed. Overal pt is doing very well and is increasing MET's reviewed Home ExRx. Pt tolerated exercise well with an average MET level of 2.3. Pt is already doing some physical therapy leg exercises twice daily, but encouraged adding in some chair fitness youtub classes for 1-2 days for some cardio. Reviewed Mets/Goals today, pt tolerating an avg of 2.3 Mets well w/o unusual s/s. Pt has increased level on NuStep from lvl 4-lvl 5, however pt is still limited by chronic knee pain on track. Pt still pursuing goal of weight loss, is currently walking everyday outside of program and plans to attend [employer] with mom to continue exercising. Pt is hoping that wt loss will help allevaite knee pain. Pt graduated the [employer] [facility] today. Pt tolerated exercise well with an average MET level of 3.18. Pt will continue to exercise by doing his PT exercises, chair fitness and shes thinking about joining the [facility] as well. She plans to exercise 5-6 days a week  for for 30-45 mins per session   Expected Outcomes Will continue to monitor and progress workloads as tolerated without s/sx. Will continue to monitor and progress workloads as tolerated without s/sx. Will continue to monitor and progress workloads as tolerated without s/sx. Pt is porgressing well, will continue to monitor and progress Pt will continue to exercise on your own and gain strength            Nutrition & Weight - Outcomes:  Pre Biometrics - 04/06/22 0831       Pre Biometrics   Waist Circumference 43 inches    Hip Circumference  49.5 inches    Waist to Hip Ratio 0.87 %    Triceps Skinfold 45 mm    % Body Fat 39.4 %    Grip Strength 24 kg    Flexibility 16 in    Single Leg Stand 3 seconds             Post Biometrics - 06/01/22 4540        Post  Biometrics   Height 5\' 2"  (1.575 m)    Weight 98.4 kg    Waist Circumference 38.5 inches    Hip Circumference 44 inches    Waist to Hip Ratio 0.88 %    BMI (Calculated) 39.67    Triceps Skinfold 44 mm    % Body Fat 48.6 %   Previous Pre was incorrect 49.6% BF   Grip Strength 27 kg    Flexibility 18.25 in    Single Leg Stand 7 seconds             Nutrition:  Nutrition Therapy & Goals - 05/13/22 0847       Nutrition Therapy   Diet Heart Healthy Diet      Personal Nutrition Goals   Nutrition Goal Patient to identify strategies for reducing cardiovascular risk by attending the [employer] education and nutrition series weekly.    Personal Goal #2 Patient to improve diet quality by using the plate method as a guide for meal planning to include lean protein/plant protein, fruits, vegetables, whole grains, nonfat dairy as part of a well-balanced diet.    Personal Goal #3 Patient to identify strategies for weight loss of 0.5-2.0# per week.    Personal Goal #4 Patient to limit sodium intake to 1500mg  per day    Comments Goals in action. Melissa Riley continues to attend some of the [employer] education and nutrition series. She has started making some changes including increased high fiber foods and decreased high fat foods. She continues mindfulness of her sodium intake. She is down 3.3# since starting with our program. Patient will benefit from participation in intensive cardiac rehab for nutrition, exercise, and lifestyle modification.      Intervention Plan   Intervention Prescribe, educate and counsel regarding individualized specific dietary modifications aiming towards targeted core components such as weight, hypertension, lipid management, diabetes, heart  failure and other comorbidities.;Nutrition handout(s) given to patient.    Expected Outcomes Short Term Goal: Understand basic principles of dietary content, such as calories, fat, sodium, cholesterol and nutrients.;Long Term Goal: Adherence to prescribed nutrition plan.             Nutrition Discharge:  Nutrition Assessments - 06/06/22 0948       Rate Your Plate Scores   Post Score 61             Education Questionnaire Score:  Knowledge Questionnaire Score - 05/30/22 0931       Knowledge  Questionnaire Score   Post Score 19/24             Goals reviewed with patient; copy given to patient. Patient graduated from cardiac rehab program on 06-03-22 with completion of 35 exercise and education sessions. Pt maintained good attendance and progressed nicely during her participation in rehab as evidenced by increased MET level.   Medication list reconciled. Repeat  PHQ9 score= 0 .  Pt has made significant lifestyle changes and should be commended for her success. Melissa Riley achieved her goals during cardiac rehab.

## 2022-06-15 ENCOUNTER — Other Ambulatory Visit: Payer: Self-pay | Admitting: Family Medicine

## 2022-06-15 DIAGNOSIS — Z1231 Encounter for screening mammogram for malignant neoplasm of breast: Secondary | ICD-10-CM

## 2022-07-17 NOTE — Progress Notes (Signed)
Cardiology Office Note:   Date:  07/20/2022  NAME:  Melissa Riley    MRN: [id number] DOB:  22-Nov-1965   PCP:  Dot Been, FNP  Cardiologist:  Reatha Harps, MD  Electrophysiologist:  None   Referring MD: Dot Been, FNP   Chief Complaint  Patient presents with   Follow-up         History of Present Illness:   Melissa Riley is a 57 y.o. female with a hx of severe MR s/p repair, pAF, HTN, CHF who presents for follow-up.   She reports she is doing well.  Blood pressure is well-controlled.  She is tolerating Entresto well.  Serum creatinine through primary care physician 0.78 with a potassium of 4.5.  Total cholesterol 164, HDL 51, LDL 90, TG 130.  She reports no chest pains or trouble breathing.  She does have some irritation at her surgical site.  He reports some irritation.  She does have some keloids.  Seems to be controlled with steroid cream.  No major bleeding on Eliquis.  CV exam normal.  No murmur.  Overall doing well.  We discussed further titrating her medical therapy with [place].  She is okay to do this.  Problem List Severe Mitral Regurgitation  -34 mm annuloplasty ring 02/16/2022 2. Systolic HF -EF 40-45% 3. Paroxysmal Afib -repeated episodes after MVR -CHADSVASC=3 (female, HTN, CHF) -2% burden on zio  4. HTN  Past Medical History: Past Medical History:  Diagnosis Date   Arthritis    Heart murmur    Hypertension    Hypothyroidism    Mitral regurgitation    Uterine fibroid     Past Surgical History: Past Surgical History:  Procedure Laterality Date   BREAST BIOPSY Left    " Patient states over 20 years ago"   MITRAL VALVE REPAIR N/A 02/16/2022   Procedure: MITRAL VALVE REPAIR (MVR) USING 34 MM EDWARDS PHYSIO II ANNULOPLASTY RING;  Surgeon: Lyn Hollingshead, MD;  Location: [facility] OR;  Service: Open Heart Surgery;  Laterality: N/A;   RIGHT/LEFT HEART CATH AND CORONARY ANGIOGRAPHY N/A 04/05/2021   Procedure: RIGHT/LEFT HEART CATH AND CORONARY  ANGIOGRAPHY;  Surgeon: Corky Crafts, MD;  Location: [facility] INVASIVE CV LAB;  Service: Cardiovascular;  Laterality: N/A;   TEE WITHOUT CARDIOVERSION N/A 02/10/2017   Procedure: TRANSESOPHAGEAL ECHOCARDIOGRAM (TEE);  Surgeon: Yates Decamp, MD;  Location: [facility] ENDOSCOPY;  Service: Cardiovascular;  Laterality: N/A;   TEE WITHOUT CARDIOVERSION N/A 04/05/2021   Procedure: TRANSESOPHAGEAL ECHOCARDIOGRAM (TEE);  Surgeon: Little Ishikawa, MD;  Location: [facility] ENDOSCOPY;  Service: Cardiovascular;  Laterality: N/A;   TEE WITHOUT CARDIOVERSION N/A 02/16/2022   Procedure: TRANSESOPHAGEAL ECHOCARDIOGRAM (TEE);  Surgeon: Lyn Hollingshead, MD;  Location: [facility] OR;  Service: Open Heart Surgery;  Laterality: N/A;   TUBAL LIGATION     WISDOM TOOTH EXTRACTION Bilateral 2004    Current Medications: Current Meds  Medication Sig   apixaban (ELIQUIS) 5 MG TABS tablet Take 1 tablet (5 mg total) by mouth 2 (two) times daily.   Ascorbic Acid (VITAMIN C PO) Take 1 tablet by mouth daily.   aspirin EC 81 MG tablet Take 81 mg by mouth daily. Swallow whole.   Cholecalciferol (VITAMIN D3) 25 MCG (1000 UT) CHEW Chew by mouth daily. 2 gummies daily   dapagliflozin propanediol (FARXIGA) 10 MG TABS tablet Take 1 tablet (10 mg total) by mouth daily before breakfast.   dapagliflozin propanediol (FARXIGA) 10 MG TABS tablet Take 1 tablet (10 mg total)  by mouth daily before breakfast.   ferrous sulfate 324 MG TBEC Take 324 mg by mouth 2 (two) times daily.   levothyroxine (SYNTHROID) 25 MCG tablet Take 25 mcg by mouth daily before breakfast.   metoprolol succinate (TOPROL XL) 25 MG 24 hr tablet Take 1 tablet (25 mg total) by mouth daily.   sacubitril-valsartan (ENTRESTO) 24-26 MG Take 1 tablet by mouth 2 (two) times daily.   sacubitril-valsartan (ENTRESTO) 24-26 MG Take 1 tablet by mouth 2 (two) times daily.   spironolactone (ALDACTONE) 25 MG tablet Take 0.5 tablets (12.5 mg total) by mouth daily.     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  Tobacco Use   Smoking status: Never   Smokeless tobacco: Never  Vaping Use   Vaping Use: Not on file  Substance and Sexual Activity   Alcohol use: No   Drug use: No   Sexual activity: Not Currently    Birth control/protection: Surgical  Other Topics Concern   Not on file  Social History Narrative   Not on file   Social Determinants of Health   Financial Resource Strain: Not on file  Food Insecurity: No Food Insecurity (02/17/2022)   Hunger Vital Sign    Worried About Running Out of Food in the Last Year: Never true    Ran Out of Food in the Last Year: Never true  Transportation Needs: No Transportation Needs (02/17/2022)   PRAPARE - Administrator, Civil Service (Medical): No    Lack of Transportation (Non-Medical): No  Physical Activity: Not on file  Stress: Not on file  Social Connections: Not on file     Family History: The patient's family history includes Breast cancer in her cousin and maternal aunt; Heart disease in her maternal aunt and mother; Hypertension in her mother.  ROS:   All other ROS reviewed and negative. Pertinent positives noted in the HPI.     EKGs/Labs/Other Studies Reviewed:   The following studies were personally reviewed by me today:  EKG:  EKG is not ordered today.        Recent Labs: 02/14/2022: ALT 16 02/24/2022: Magnesium 2.2 04/08/2022: BUN 11; Creatinine, Ser 0.79; Hemoglobin 12.3; Platelets 269; Potassium 3.5; Sodium 140; TSH 2.160   Recent Lipid Panel No results found for: "CHOL", "TRIG", "HDL", "CHOLHDL", "VLDL", "LDLCALC", "LDLDIRECT"  Physical Exam:   VS:  BP 114/78 (BP Location: Left Arm, Patient Position: Sitting, Cuff  Size: Large)   Pulse 81   Ht 5' 2.5" (1.588 m)   Wt 214 lb 9.6 oz (97.3 kg)   LMP  (LMP Unknown)   SpO2 97%   BMI 38.63 kg/m    Wt Readings from Last 3 Encounters:  07/20/22 214 lb 9.6 oz (97.3 kg)  06/01/22 216 lb 14.9 oz (98.4 kg)  04/08/22 221 lb 3.2 oz (100.3 kg)    General: Well nourished, well developed, in no acute distress Head: Atraumatic, normal size  Eyes: PEERLA, EOMI  Neck: Supple, no JVD Endocrine: No thryomegaly Cardiac: Normal S1, S2; RRR; no murmurs, rubs, or gallops Lungs: Clear to auscultation bilaterally, no wheezing, rhonchi or rales  Abd: Soft, nontender, no hepatomegaly  Ext: No  edema, pulses 2+ Musculoskeletal: No deformities, BUE and BLE strength normal and equal Skin: Warm and dry, no rashes   Neuro: Alert and oriented to person, place, time, and situation, CNII-XII grossly intact, no focal deficits  Psych: Normal mood and affect   ASSESSMENT:   Melissa Riley is a 57 y.o. female who presents for the following: 1. Chronic systolic heart failure (HCC)   2. S/P MVR (mitral valve repair)   3. Nonrheumatic mitral valve regurgitation   4. Paroxysmal atrial fibrillation (HCC)     PLAN:   1. Chronic systolic heart failure (HCC) -Ejection fraction 40-45% after mitral valve surgery.  Suspect this was all related to volume overload from severe mitral valve regurgitation.  Left heart catheterization was normal.  We will further titrate therapy with Farxiga 10 mg daily.  She will continue Entresto 24-26 mg twice daily.  She is on metoprolol succinate 25 mg daily.  She is on Aldactone 12.5 mg daily.  Serum creatinine potassium are within limits.  She will see me back in 3 months with a repeat echo before that appointment.  2. S/P MVR (mitral valve repair) 3. Nonrheumatic mitral valve regurgitation -Status post mitral valve repair.  She understands needs antibiotics before dental work.  She is on aspirin.  Stable repair on recent echo.  4. Paroxysmal atrial  fibrillation (HCC) -Low burden.  No A-fib.  She is on Eliquis due to a CHA2DS2-VASc score of 3 (female, hypertension, CHF).  Continue metoprolol.      Disposition: Return in about 3 months (around 10/20/2022).  Medication Adjustments/Labs and Tests Ordered: Current medicines are reviewed at length with the patient today.  Concerns regarding medicines are outlined above.  Orders Placed This Encounter  Procedures   ECHOCARDIOGRAM COMPLETE   Meds ordered this encounter  Medications   dapagliflozin propanediol (FARXIGA) 10 MG TABS tablet    Sig: Take 1 tablet (10 mg total) by mouth daily before breakfast.    Dispense:  30 tablet    Refill:  4   dapagliflozin propanediol (FARXIGA) 10 MG TABS tablet    Sig: Take 1 tablet (10 mg total) by mouth daily before breakfast.    Dispense:  14 tablet    Refill:  0    Order Specific Question:   Lot Number?    Answer:   [id number]    Order Specific Question:   Expiration Date?    Answer:   03/23/2025   Patient Instructions  Medication Instructions:  START FARXIGA 10MG  DAILY *If you need a refill on your cardiac medications before your next appointment, please call your pharmacy*  Testing/Procedures: Echocardiogram - Your physician has requested that you have an echocardiogram. Echocardiography is a painless test that uses sound waves to create images of your heart. It provides your doctor with information about the size and shape of your heart and how well your heart's chambers and valves are working. This procedure takes approximately one hour. There are no restrictions for this procedure.    Follow-Up: At [facility], you and your health needs are our priority.  As part of our continuing mission to provide you with exceptional heart care, we have created designated Provider Care Teams.  These Care Teams include your primary Cardiologist (physician) and Advanced Practice Providers (APPs -  Physician Assistants and Nurse Practitioners) who all  work together to provide you with the care you need, when you need it.  Your next appointment:   3 month(s)  Provider:   Gerri Spore  Cleophus Molt, MD     Other Instructions     Time Spent with Patient: I have spent a total of 25 minutes with patient reviewing hospital notes, telemetry, EKGs, labs and examining the patient as well as establishing an assessment and plan that was discussed with the patient.  > 50% of time was spent in direct patient care.  Signed, Lenna Gilford. Flora Lipps, MD, [facility]  [facility]  [address], [address] [place], [place] [address] [phone]  07/20/2022 8:27 AM

## 2022-07-20 ENCOUNTER — Ambulatory Visit: Payer: Medicaid Other | Attending: Cardiovascular Disease | Admitting: Cardiovascular Disease

## 2022-07-20 ENCOUNTER — Encounter: Payer: Self-pay | Admitting: Cardiovascular Disease

## 2022-07-20 ENCOUNTER — Telehealth: Payer: Self-pay

## 2022-07-20 VITALS — BP 114/78 | HR 81 | Ht 62.5 in | Wt 214.6 lb

## 2022-07-20 DIAGNOSIS — I480 Paroxysmal atrial fibrillation: Secondary | ICD-10-CM

## 2022-07-20 DIAGNOSIS — I340 Nonrheumatic mitral (valve) insufficiency: Secondary | ICD-10-CM | POA: Diagnosis not present

## 2022-07-20 DIAGNOSIS — I5022 Chronic systolic (congestive) heart failure: Secondary | ICD-10-CM | POA: Diagnosis not present

## 2022-07-20 DIAGNOSIS — Z9889 Other specified postprocedural states: Secondary | ICD-10-CM | POA: Diagnosis not present

## 2022-07-20 MED ORDER — DAPAGLIFLOZIN PROPANEDIOL 10 MG PO TABS: 10.0000 mg | ORAL_TABLET | Freq: Every day | ORAL | 0 refills | Status: DC

## 2022-07-20 MED ORDER — DAPAGLIFLOZIN PROPANEDIOL 10 MG PO TABS: 10.0000 mg | ORAL_TABLET | Freq: Every day | ORAL | 4 refills | Status: DC

## 2022-07-20 NOTE — Telephone Encounter (Signed)
Called and spoke with pt, she is asking what she needs to do to be "cleared" to have a dental procedure. Informed pt that dentist will have to fax a cardiac clearance request describing what he is going to do and what he needs from [place]. Verbalized understanding. Fax number [phone]

## 2022-07-20 NOTE — Patient Instructions (Signed)
Medication Instructions:  START FARXIGA 10MG  DAILY *If you need a refill on your cardiac medications before your next appointment, please call your pharmacy*  Testing/Procedures: Echocardiogram - Your physician has requested that you have an echocardiogram. Echocardiography is a painless test that uses sound waves to create images of your heart. It provides your doctor with information about the size and shape of your heart and how well your heart's chambers and valves are working. This procedure takes approximately one hour. There are no restrictions for this procedure.    Follow-Up: At [facility], you and your health needs are our priority.  As part of our continuing mission to provide you with exceptional heart care, we have created designated Provider Care Teams.  These Care Teams include your primary Cardiologist (physician) and Advanced Practice Providers (APPs -  Physician Assistants and Nurse Practitioners) who all work together to provide you with the care you need, when you need it.  Your next appointment:   3 month(s)  Provider:   Reatha Harps, MD     Other Instructions

## 2022-07-21 ENCOUNTER — Telehealth: Payer: Self-pay | Admitting: Cardiovascular Disease

## 2022-07-21 ENCOUNTER — Telehealth: Payer: Self-pay

## 2022-07-21 ENCOUNTER — Other Ambulatory Visit (HOSPITAL_COMMUNITY): Payer: Self-pay

## 2022-07-21 NOTE — Telephone Encounter (Signed)
Pharmacy Patient Advocate Encounter  Prior Authorization for FARXIGA 10 MG  has been approved.    Effective dates: 07/07/22 through 07/21/23  Haze Rushing, CPhT Pharmacy Patient Advocate Specialist Direct Number: [phone] Fax: [phone]

## 2022-07-21 NOTE — Telephone Encounter (Signed)
Pharmacy Patient Advocate Encounter   Received notification from [facility] MEDICAID that prior authorization for FARXIGA 10 MG  is needed.    PA submitted on 07/21/22 Key [id number] Status is pending  Haze Rushing, CPhT Pharmacy Patient Advocate Specialist Direct Number: [phone] Fax: [phone]

## 2022-07-21 NOTE — Telephone Encounter (Signed)
Pt is requesting a callback regarding a PA being needed for one of he medications and she's unsure of which one so would like some help from a nurse. Please advise

## 2022-07-21 NOTE — Telephone Encounter (Signed)
Patient need prior auth for farxiga

## 2022-07-21 NOTE — Telephone Encounter (Signed)
PA initiated, please see separate encounter for updates on determination. (I will route you back in once a decision has been made)  Maiah Sinning, CPhT Pharmacy Patient Advocate Specialist Direct Number: [phone] Fax: [phone]  

## 2022-07-21 NOTE — Telephone Encounter (Signed)
Patient is aware prior auth for farxiga has been approved

## 2022-07-27 ENCOUNTER — Ambulatory Visit (HOSPITAL_COMMUNITY): Payer: Medicaid Other

## 2022-08-03 ENCOUNTER — Telehealth: Payer: Self-pay | Admitting: Cardiovascular Disease

## 2022-08-03 NOTE — Telephone Encounter (Signed)
I did not need this encounter. °

## 2022-08-03 NOTE — Telephone Encounter (Signed)
New Message °

## 2022-08-15 ENCOUNTER — Other Ambulatory Visit: Payer: Self-pay | Admitting: Cardiovascular Disease

## 2022-08-15 ENCOUNTER — Telehealth: Payer: Self-pay | Admitting: Cardiovascular Disease

## 2022-08-15 DIAGNOSIS — L910 Hypertrophic scar: Secondary | ICD-10-CM

## 2022-08-15 MED ORDER — ENTRESTO 24-26 MG PO TABS: 1.0000 | ORAL_TABLET | Freq: Two times a day (BID) | ORAL | 3 refills | Status: DC

## 2022-08-15 NOTE — Telephone Encounter (Signed)
*  STAT* If patient is at the pharmacy, call can be transferred to refill team.   1. Which medications need to be refilled? (please list name of each medication and dose if known) sacubitril-valsartan (ENTRESTO) 24-26 MG     2. Would you like to learn more about the convenience, safety, & potential cost savings by using the [facility]? No    3. Are you open to using the [facility] (Type [facility]. No  ).   4. Which pharmacy/location (including street and city if local pharmacy) is medication to be sent to?[facility] - [place] ([place] - [address]   5. Do they need a 30 day or 90 day supply? 90

## 2022-08-15 NOTE — Telephone Encounter (Signed)
Spoke to the patient, she had mitral valve repair on 1/24 during her last OV on 6/26 pt stated she forgot to mention she is still experiencing bumps around the incision sites. Pt stated she was advised by Dr. Flora Lipps if her symptoms do not improve he can send a referral to a dermatologist . Pt denies any redness or irritation on or near the bumps. Will forward to MD for advise.

## 2022-08-15 NOTE — Telephone Encounter (Signed)
Patient called to see if Dr. Flora Lipps could send her if a referral to dermotologist due to bumps she been experiencing from heart procedure.

## 2022-08-15 NOTE — Telephone Encounter (Signed)
RX now sent to preferred pharmacy.  

## 2022-08-18 NOTE — Telephone Encounter (Signed)
Spoke to patient - she aware referral has been ma de to dermatology

## 2022-08-18 NOTE — Telephone Encounter (Signed)
Referral to dermatology for keloids.  Gerri Spore T. Flora Lipps, MD, [facility]  [facility] [address] [place], [place] [address] [phone] 2:43 PM

## 2022-10-24 ENCOUNTER — Ambulatory Visit (HOSPITAL_COMMUNITY): Payer: Medicaid Other | Attending: Cardiovascular Disease

## 2022-10-24 DIAGNOSIS — I5022 Chronic systolic (congestive) heart failure: Secondary | ICD-10-CM

## 2022-10-24 LAB — ECHOCARDIOGRAM COMPLETE
Area-P 1/2: 1.95 cm2
Est EF: 55
S' Lateral: 2.8 cm

## 2022-11-04 ENCOUNTER — Ambulatory Visit
Admission: RE | Admit: 2022-11-04 | Discharge: 2022-11-04 | Disposition: A | Discharge status: Home / Self Care | Payer: Medicaid Other | Source: Ambulatory Visit | Attending: Family Medicine | Admitting: Family Medicine

## 2022-11-04 DIAGNOSIS — Z1231 Encounter for screening mammogram for malignant neoplasm of breast: Secondary | ICD-10-CM

## 2022-11-07 ENCOUNTER — Telehealth: Payer: Self-pay | Admitting: Cardiovascular Disease

## 2022-11-07 NOTE — Telephone Encounter (Signed)
*  STAT* If patient is at the pharmacy, call can be transferred to refill team.   1. Which medications need to be refilled? (please list name of each medication and dose if known) spironolactone (ALDACTONE) 25 MG tablet    2. Would you like to learn more about the convenience, safety, & potential cost savings by using the [facility]? No     3. Are you open to using the [facility] ([facility]. No  ).   4. Which pharmacy/location (including street and city if local pharmacy) is medication to be sent to? spironolactone (ALDACTONE) 25 MG tablet    5. Do they need a 30 day or 90 day supply? 90

## 2022-11-07 NOTE — Telephone Encounter (Signed)
Pt following up on the medication refill. Please advise

## 2022-11-08 ENCOUNTER — Other Ambulatory Visit: Payer: Self-pay

## 2022-11-08 MED ORDER — SPIRONOLACTONE 25 MG PO TABS: 12.5000 mg | ORAL_TABLET | Freq: Every day | ORAL | 0 refills | Status: DC

## 2022-11-08 NOTE — Telephone Encounter (Signed)
Pt is calling back for refill being sent to [facility] - [place] ([place] - [address] . Please advise.

## 2022-11-08 NOTE — Telephone Encounter (Signed)
She would like for someone to call when its sent because she is out of it and she is waiting at the pharmacy

## 2022-11-09 NOTE — Progress Notes (Unsigned)
Cardiology Office Note:  .   Date:  11/10/2022  ID:  Melissa Riley, DOB [birth year], MRN [id number] PCP: Dot Been, FNP  [facility] Providers Cardiologist:  Reatha Harps, MD    History of Present Illness: .   Melissa Riley is a 57 y.o. female with below history who presents for follow-up.   Discussed the use of AI scribe software for clinical note transcription with the patient, who gave verbal consent to proceed.  History of Present Illness   Melissa Riley, a 56 year old female with a history of severe mitral valve regurgitation status post repair in January 2024, systolic heart failure with recovery of ejection fraction, paroxysmal atrial fibrillation, and hypertension, presents for a routine follow-up. She reports no symptoms related to her heart conditions. Her recent monitor shows a 2% afib burden. Her ejection fraction, which was down to 40% after mitral valve repair, has improved to 55%.  She mentions a change in her blood pressure medication by her primary care doctor, who advised her to take a whole pill of spironolactone due to concerns about her blood pressure. However, she has not been taking her blood pressure medication for a couple of days due to the need for a new prescription for the increased dose. She has been taking her other medications, including Entresto, metoprolol succinate, and Farxiga, as prescribed.  She also reports irritation from keloids, which she has been managing with silicone wrap. She has been trying to schedule an appointment with a dermatologist but has been unable to do so due to long wait times.          Problem List Severe Mitral Regurgitation  -34 mm annuloplasty ring 02/16/2022 2. Systolic HF -EF 40-45% 03/29/2022 -EF 55% 10/24/2022 3. Paroxysmal Afib -repeated episodes after MVR -CHADSVASC=3 (female, HTN, CHF) -2% burden on zio  4. HTN    ROS: All other ROS reviewed and negative. Pertinent positives noted in the HPI.      Studies Reviewed: Marland Kitchen       Physical Exam:   VS:  BP 128/82 (BP Location: Right Arm, Patient Position: Sitting, Cuff Size: Large)   Pulse 83   Ht 5' 2.5" (1.588 m)   Wt 218 lb 9.6 oz (99.2 kg)   LMP  (LMP Unknown)   SpO2 98%   BMI 39.35 kg/m    Wt Readings from Last 3 Encounters:  11/10/22 218 lb 9.6 oz (99.2 kg)  07/20/22 214 lb 9.6 oz (97.3 kg)  06/01/22 216 lb 14.9 oz (98.4 kg)    GEN: Well nourished, well developed in no acute distress NECK: No JVD; No carotid bruits CARDIAC: RRR, no murmurs, rubs, gallops RESPIRATORY:  Clear to auscultation without rales, wheezing or rhonchi  ABDOMEN: Soft, non-tender, non-distended EXTREMITIES:  No edema; No deformity  ASSESSMENT AND PLAN: .   Assessment and Plan    Mitral Valve Regurgitation Status post repair in January 2024. No current symptoms. -Continue Aspirin 81mg  daily. -Reminded patient to take antibiotics before dental work.  Systolic Heart Failure Ejection fraction has improved to 55% from 40% post mitral valve repair. -Continue Entresto 24/26mg  BID, Metoprolol succinate 25mg  daily, Farxiga 10mg  daily. -increase aldactone to 25 mg qd   Paroxysmal Atrial Fibrillation 2% burden, no current symptoms. -Continue Metoprolol for rhythm control. -Continue Eliquis for stroke prophylaxis.  Hypertension Well controlled, BP 128/82. -Increase Aldactone to 25mg  daily.  Follow-up Plans -See PA in 6 months, and return for follow-up with me in 1 year. -Encouraged patient  to seek dermatology consultation for keloids and to get on a cancellation list.              Follow-up: Return in about 6 months (around 05/11/2023).  Time Spent with Patient: I have spent a total of 25 minutes with patient reviewing hospital notes, telemetry, EKGs, labs and examining the patient as well as establishing an assessment and plan that was discussed with the patient.  > 50% of time was spent in direct patient care.  Signed, Lenna Gilford. Flora Lipps, MD,  [facility]  [facility]  [address] [place], [place] [address] [phone]  1:14 PM

## 2022-11-10 ENCOUNTER — Ambulatory Visit: Payer: Medicaid Other | Attending: Cardiovascular Disease | Admitting: Cardiovascular Disease

## 2022-11-10 ENCOUNTER — Encounter: Payer: Self-pay | Admitting: Cardiovascular Disease

## 2022-11-10 VITALS — BP 128/82 | HR 83 | Ht 62.5 in | Wt 218.6 lb

## 2022-11-10 DIAGNOSIS — I480 Paroxysmal atrial fibrillation: Secondary | ICD-10-CM | POA: Diagnosis not present

## 2022-11-10 DIAGNOSIS — Z9889 Other specified postprocedural states: Secondary | ICD-10-CM

## 2022-11-10 DIAGNOSIS — I5022 Chronic systolic (congestive) heart failure: Secondary | ICD-10-CM

## 2022-11-10 DIAGNOSIS — I10 Essential (primary) hypertension: Secondary | ICD-10-CM | POA: Diagnosis not present

## 2022-11-10 MED ORDER — SPIRONOLACTONE 25 MG PO TABS: 25.0000 mg | ORAL_TABLET | Freq: Every day | ORAL | 3 refills | Status: DC

## 2022-11-10 NOTE — Patient Instructions (Addendum)
Medication Instructions:  - Start spironolactone (ALDACTONE) 25MG , once daily   *If you need a refill on your cardiac medications before your next appointment, please call your pharmacy*   Lab Work:    If you have labs (blood work) drawn today and your tests are completely normal, you will receive your results only by: MyChart Message (if you have MyChart) OR A paper copy in the mail If you have any lab test that is abnormal or we need to change your treatment, we will call you to review the results.   Testing/Procedures:    Follow-Up: At [facility], you and your health needs are our priority.  As part of our continuing mission to provide you with exceptional heart care, we have created designated Provider Care Teams.  These Care Teams include your primary Cardiologist (physician) and Advanced Practice Providers (APPs -  Physician Assistants and Nurse Practitioners) who all work together to provide you with the care you need, when you need it.  We recommend signing up for the patient portal called "MyChart".  Sign up information is provided on this After Visit Summary.  MyChart is used to connect with patients for Virtual Visits (Telemedicine).  Patients are able to view lab/test results, encounter notes, upcoming appointments, etc.  Non-urgent messages can be sent to your provider as well.   To learn more about what you can do with MyChart, go to [email or web address].    Your next appointment:   6 month(s)  The format for your next appointment:   In Person  Provider:   Edd Fabian, FNP, Micah Flesher, PA-C, Marjie Skiff, PA-C, Robet Leu, PA-C, Juanda Crumble, PA-C, Joni Reining, DNP, ANP, Azalee Course, PA-C, Bernadene Person, NP, or Reather Littler, NP    Then, Reatha Harps, MD will plan to see you again in 1 year.    Other Instructions

## 2022-11-12 ENCOUNTER — Other Ambulatory Visit: Payer: Self-pay | Admitting: Cardiovascular Disease

## 2022-11-14 NOTE — Telephone Encounter (Signed)
Prescription refill request for Eliquis received. Indication:afib Last office visit:10/24 Scr:0.78  6/24 Age: 57 Weight:99.2  kg  Prescription refilled

## 2022-11-30 ENCOUNTER — Emergency Department (HOSPITAL_COMMUNITY): Payer: Medicaid Other

## 2022-11-30 ENCOUNTER — Encounter (HOSPITAL_COMMUNITY): Payer: Self-pay | Admitting: *Deleted

## 2022-11-30 ENCOUNTER — Ambulatory Visit (HOSPITAL_COMMUNITY)
Admission: EM | Admit: 2022-11-30 | Discharge: 2022-11-30 | Disposition: A | Discharge status: Home / Self Care | Payer: Medicaid Other | Attending: Internal Medicine | Admitting: Internal Medicine

## 2022-11-30 ENCOUNTER — Encounter (HOSPITAL_COMMUNITY): Payer: Self-pay | Admitting: Emergency Medicine

## 2022-11-30 ENCOUNTER — Other Ambulatory Visit: Payer: Self-pay

## 2022-11-30 ENCOUNTER — Emergency Department (HOSPITAL_COMMUNITY)
Admission: EM | Admit: 2022-11-30 | Discharge: 2022-11-30 | Disposition: A | Discharge status: Home / Self Care | Payer: Medicaid Other | Attending: Emergency Medicine | Admitting: Emergency Medicine

## 2022-11-30 DIAGNOSIS — Z7901 Long term (current) use of anticoagulants: Secondary | ICD-10-CM | POA: Insufficient documentation

## 2022-11-30 DIAGNOSIS — R000 Tachycardia, unspecified: Secondary | ICD-10-CM

## 2022-11-30 DIAGNOSIS — Z9889 Other specified postprocedural states: Secondary | ICD-10-CM

## 2022-11-30 DIAGNOSIS — R002 Palpitations: Secondary | ICD-10-CM | POA: Diagnosis present

## 2022-11-30 DIAGNOSIS — Z7982 Long term (current) use of aspirin: Secondary | ICD-10-CM | POA: Diagnosis not present

## 2022-11-30 DIAGNOSIS — I4892 Unspecified atrial flutter: Secondary | ICD-10-CM | POA: Insufficient documentation

## 2022-11-30 DIAGNOSIS — Z8679 Personal history of other diseases of the circulatory system: Secondary | ICD-10-CM

## 2022-11-30 HISTORY — DX: Unspecified atrial fibrillation: I48.91

## 2022-11-30 LAB — BASIC METABOLIC PANEL
Anion gap: 11 (ref 5–15)
BUN: 16 mg/dL (ref 6–20)
CO2: 23 mmol/L (ref 22–32)
Calcium: 9.4 mg/dL (ref 8.9–10.3)
Chloride: 105 mmol/L (ref 98–111)
Creatinine, Ser: 0.87 mg/dL (ref 0.44–1.00)
GFR, Estimated: >60 mL/min (ref >60)
Glucose, Bld: 103 mg/dL — ABNORMAL HIGH (ref 70–99)
Potassium: 4.8 mmol/L (ref 3.5–5.1)
Sodium: 139 mmol/L (ref 135–145)

## 2022-11-30 LAB — CBC
HCT: 50.6 % — ABNORMAL HIGH (ref 36.0–46.0)
Hemoglobin: 16.6 g/dL — ABNORMAL HIGH (ref 12.0–15.0)
MCH: 31 pg (ref 26.0–34.0)
MCHC: 32.8 g/dL (ref 30.0–36.0)
MCV: 94.6 fL (ref 80.0–100.0)
Platelets: 265 10*3/uL (ref 150–400)
RBC: 5.35 MIL/uL — ABNORMAL HIGH (ref 3.87–5.11)
RDW: 13.4 % (ref 11.5–15.5)
WBC: 7.6 10*3/uL (ref 4.0–10.5)
nRBC: 0 % (ref 0.0–0.2)

## 2022-11-30 LAB — MAGNESIUM: Magnesium: 2.4 mg/dL (ref 1.7–2.4)

## 2022-11-30 MED ORDER — SODIUM CHLORIDE 0.9 % IV BOLUS
1000.0000 mL | Freq: Once | INTRAVENOUS | Status: AC
  Administered 2022-11-30: 1000 mL via INTRAVENOUS

## 2022-11-30 MED ORDER — PROPOFOL 10 MG/ML IV BOLUS: 0.5000 mg/kg | Freq: Once | INTRAVENOUS | Status: AC

## 2022-11-30 MED ORDER — PROPOFOL 10 MG/ML IV BOLUS
INTRAVENOUS | Status: AC
  Administered 2022-11-30: 48.8 mg via INTRAVENOUS
  Filled 2022-11-30: qty 20

## 2022-11-30 MED ORDER — METOPROLOL TARTRATE 5 MG/5ML IV SOLN
5.0000 mg | Freq: Once | INTRAVENOUS | Status: AC
  Administered 2022-11-30: 5 mg via INTRAVENOUS
  Filled 2022-11-30: qty 5

## 2022-11-30 NOTE — ED Provider Notes (Signed)
I was called into triage to evaluate patient by nursing staff.  Reports that she woke up this morning and soon after waking up developed palpitations prompting evaluation.  EKG was obtained that showed sinus tachycardia with ventricular rate of 130 bpm; compared to 12/15/2021 tracing nonspecific ST changes with new Q wave noted in lead III.  She does have a history of valve replacement earlier this year and is followed by cardiology.  She denies any recent illness or additional symptoms including chest pain, lightheadedness, shortness of breath.  She reports compliance with her medication including taking metoprolol earlier today.  Given her cardiovascular history I did recommend she go to the emergency room for further evaluation and management given our limited resources to which she expressed understanding.  She was stable at the time of discharge.   Jeani Hawking, PA-C 11/30/22 1052

## 2022-11-30 NOTE — ED Triage Notes (Signed)
Pt reports she feels like her heart is racing but no CP. Pt had an open valve replacement Jan 2024. Pt was already awake when the heart racing started.

## 2022-11-30 NOTE — ED Notes (Signed)
ER,PA at bed side to eval PT.

## 2022-11-30 NOTE — Discharge Instructions (Signed)
Continue your home medications as prescribed.  You will be referred to the A-fib clinic, they should call you to schedule an appointment.  If you develop recurrent palpitations or if you develop chest pain, shortness of breath, dizziness, lightheadedness, or any other new/concerning symptoms then return to the ER or call 911.

## 2022-11-30 NOTE — ED Notes (Signed)
Pt AAOx4. Pt is on room air. Pt's vitals stable. Pt passed PO challenge. Pt's family driving pt home post DC. Pt in no acute distress.

## 2022-11-30 NOTE — ED Notes (Signed)
RT contacted for conscious sedation, RT en route to bedside at this time.

## 2022-11-30 NOTE — ED Triage Notes (Signed)
Pt. Stated, I started having my heart racing around 700 this morning. Denies any other symptoms.. I take a bunch of medications for A-Fib. Which was dx Jan. 24, 2024

## 2022-11-30 NOTE — ED Provider Notes (Signed)
[place] EMERGENCY DEPARTMENT AT [facility] Provider Note   CSN: [id number] Arrival date & time: 11/30/22  1052     History  Chief Complaint  Patient presents with   Palpitations    Melissa Riley is a 57 y.o. female.  HPI 57 year old female with a history of a mitral valve repair in January 2024 as well as A-fib who is currently taking Eliquis presents with palpitations.  Started around 7 or 8 AM this morning.  She felt her heart rate going fast but denies any chest pain or shortness of breath.  No dizziness or lightheadedness.  She actually does not feel the palpitations like she did before.  She has been compliant with her meds and most recently took her Eliquis this morning.  Recently had her blood pressure dose increased but otherwise has been on stable medications.  Home Medications Prior to Admission medications   Medication Sig Start Date End Date Taking? Authorizing Provider  apixaban (ELIQUIS) 5 MG TABS tablet Take 1 tablet by mouth twice daily 11/14/22  Yes O'Neal, Ronnald Ramp, MD  Ascorbic Acid (VITAMIN C PO) Take 1 tablet by mouth daily.   Yes [provider]  aspirin EC 81 MG tablet Take 81 mg by mouth daily. Swallow whole.   Yes [provider]  Cholecalciferol (VITAMIN D3) 25 MCG (1000 UT) CHEW Chew by mouth daily. 2 gummies daily   Yes [provider]  dapagliflozin propanediol (FARXIGA) 10 MG TABS tablet Take 1 tablet (10 mg total) by mouth daily before breakfast. 07/20/22  Yes O'Neal, Ronnald Ramp, MD  ferrous sulfate 324 MG TBEC Take 324 mg by mouth 2 (two) times daily.   Yes [provider]  levothyroxine (SYNTHROID) 25 MCG tablet Take 25 mcg by mouth daily before breakfast.   Yes [provider]  metoprolol succinate (TOPROL XL) 25 MG 24 hr tablet Take 1 tablet (25 mg total) by mouth daily. 04/08/22 04/08/23 Yes O'Neal, Ronnald Ramp, MD  Multiple Vitamins-Minerals (MULTIVITAMIN WOMEN PO) Take by mouth  daily.   Yes [provider]  sacubitril-valsartan (ENTRESTO) 24-26 MG Take 1 tablet by mouth 2 (two) times daily. 08/15/22  Yes O'Neal, Ronnald Ramp, MD  spironolactone (ALDACTONE) 25 MG tablet Take 1 tablet (25 mg total) by mouth daily. 11/10/22  Yes O'Neal, Ronnald Ramp, MD      Allergies    Amoxil [amoxicillin]    Review of Systems   Review of Systems  Constitutional:  Negative for fever.  Respiratory:  Negative for cough and shortness of breath.   Cardiovascular:  Positive for palpitations. Negative for chest pain.  Gastrointestinal:  Negative for abdominal pain, diarrhea and vomiting.  Neurological:  Negative for dizziness and light-headedness.    Physical Exam Updated Vital Signs BP 112/72   Pulse 79   Temp 98.1 F (36.7 C) (Oral)   Resp 18   Wt 97.5 kg   LMP  (LMP Unknown)   SpO2 100%   BMI 38.70 kg/m  Physical Exam Vitals and nursing note reviewed.  Constitutional:      General: She is not in acute distress.    Appearance: She is well-developed. She is obese. She is not ill-appearing or diaphoretic.  HENT:     Head: Normocephalic and atraumatic.  Cardiovascular:     Rate and Rhythm: Regular rhythm. Tachycardia present.     Heart sounds: Normal heart sounds.  Pulmonary:     Effort: Pulmonary effort is normal.     Breath sounds:  Normal breath sounds. No wheezing or rales.  Abdominal:     General: There is no distension.  Skin:    General: Skin is warm and dry.  Neurological:     Mental Status: She is alert.     ED Results / Procedures / Treatments   Labs (all labs ordered are listed, but only abnormal results are displayed) Labs Reviewed  BASIC METABOLIC PANEL - Abnormal; Notable for the following components:      Result Value   Glucose, Bld 103 (*)    All other components within normal limits  CBC - Abnormal; Notable for the following components:   RBC 5.35 (*)    Hemoglobin 16.6 (*)    HCT 50.6 (*)    All other components within normal  limits  MAGNESIUM    EKG EKG Interpretation Date/Time:  Wednesday November 30 2022 12:59:14 EST Ventricular Rate:  125 PR Interval:  106 QRS Duration:  123 QT Interval:  347 QTC Calculation: 501 R Axis:   18  Text Interpretation: Atrial flutter with predominant 2:1 AV block Nonspecific intraventricular conduction delay Inferior infarct, old Confirmed by Pricilla Loveless [phone]) on 11/30/2022 1:01:37 PM   EKG Interpretation Date/Time:  Wednesday November 30 2022 15:14:18 EST Ventricular Rate:  77 PR Interval:  152 QRS Duration:  100 QT Interval:  397 QTC Calculation: 450 R Axis:   42  Text Interpretation: Normal sinus rhythm aflutter no longer present Confirmed by Pricilla Loveless [phone]) on 11/30/2022 3:25:17 PM        Radiology DG Chest 1 View  Result Date: 11/30/2022 CLINICAL DATA:  5107 Atrial fibrillation (HCC) 5107.  Palpitations. EXAM: CHEST  1 VIEW COMPARISON:  03/10/2022. FINDINGS: Bilateral lung fields are clear. Bilateral costophrenic angles are clear. Stable cardio-mediastinal silhouette. Prosthetic mitral valve again seen. No acute osseous abnormalities. The soft tissues are within normal limits. IMPRESSION: *No active disease. Electronically Signed   By: Jules Schick M.D.   On: 11/30/2022 12:50    Procedures .Sedation  Date/Time: 11/30/2022 3:16 PM  Performed by: Pricilla Loveless, MD Authorized by: Pricilla Loveless, MD   Consent:    Consent obtained:  Verbal and written   Consent given by:  Patient Universal protocol:    Immediately prior to procedure, a time out was called: yes     Patient identity confirmed:  Verbally with patient Pre-sedation assessment:    Time since last food or drink:  8 hours   NPO status caution: urgency dictates proceeding with non-ideal NPO status     ASA classification: class 2 - patient with mild systemic disease     Mallampati score:  I - soft palate, uvula, fauces, pillars visible   Pre-sedation assessments completed and  reviewed: airway patency, cardiovascular function, hydration status, mental status, nausea/vomiting, pain level, respiratory function and temperature     Pre-sedation assessment completed:  11/30/2022 1:30 PM Immediate pre-procedure details:    Reassessment: Patient reassessed immediately prior to procedure     Reviewed: vital signs, relevant labs/tests and NPO status     Verified: bag valve mask available, emergency equipment available, intubation equipment available, IV patency confirmed, oxygen available and suction available   Procedure details (see MAR for exact dosages):    Preoxygenation:  Nasal cannula   Sedation:  Propofol   Intended level of sedation: deep   Analgesia:  None   Intra-procedure monitoring:  Blood pressure monitoring, cardiac monitor, continuous pulse oximetry, continuous capnometry, frequent LOC assessments and frequent vital sign checks   Intra-procedure  events: none     Total Provider sedation time (minutes):  10 Post-procedure details:    Post-sedation assessment completed:  11/30/2022 3:27 PM   Attendance: Constant attendance by certified staff until patient recovered     Recovery: Patient returned to pre-procedure baseline     Patient is stable for discharge or admission: yes     Procedure completion:  Tolerated well, no immediate complications .Cardioversion  Date/Time: 11/30/2022 3:17 PM  Performed by: Pricilla Loveless, MD Authorized by: Pricilla Loveless, MD   Consent:    Consent obtained:  Verbal and written   Consent given by:  Patient   Risks discussed:  Cutaneous burn, death, induced arrhythmia and pain Pre-procedure details:    Cardioversion basis:  Emergent   Rhythm:  Atrial flutter   Electrode placement:  Anterior-posterior Patient sedated: Yes. Refer to sedation procedure documentation for details of sedation.  Attempt one:    Cardioversion mode:  Synchronous   Waveform:  Biphasic   Shock (Joules):  200   Shock outcome:  Conversion to normal  sinus rhythm Post-procedure details:    Patient status:  Awake   Patient tolerance of procedure:  Tolerated well, no immediate complications     Medications Ordered in ED Medications  metoprolol tartrate (LOPRESSOR) injection 5 mg (5 mg Intravenous Given 11/30/22 1228)  sodium chloride 0.9 % bolus 1,000 mL (1,000 mLs Intravenous New Bag/Given 11/30/22 1500)  propofol (DIPRIVAN) 10 mg/mL bolus/IV push 48.8 mg (48.8 mg Intravenous Given 11/30/22 1509)    ED Course/ Medical Decision Making/ A&P                                 Medical Decision Making Amount and/or Complexity of Data Reviewed Labs: ordered.    Details: Unremarkable electrolytes Radiology: ordered and independent interpretation performed.    Details: No CHF ECG/medicine tests: ordered and independent interpretation performed.    Details: Atrial flutter Post-cardioversion shows sinus rhythm  Risk Prescription drug management.   Patient presents with palpitations consistent with atrial flutter.  Previously had A-fib for her valve replacement several months ago.  Does not seem like she has had any since.  She is not having any chest pain or anginal symptoms.  She was a little leery of trying cardioversion so I did try metoprolol which temporarily slowed her heart rate down but then she went right back into a similar heart rate of around 125-130.  She ultimately agreed to cardioversion.  This went well without any obvious adverse effects.  She has tolerated this well, is now drinking without difficulty, and has converted to sinus rhythm.  Will have her continue her Eliquis and other meds and follow-up in the A-fib clinic.  Discharged home with return precautions.  CHA2DS2/VAS Stroke Risk Points  Current as of about an hour ago     3 >= 2 Points: High Risk  1 to 1.99 Points: Medium Risk  0 Points: Low Risk    Last Change:       Details    This score determines the patient's risk of having a stroke if the  patient has  atrial fibrillation.       Points Metrics  1 Has Congestive Heart Failure:  Yes    Current as of about an hour ago  0 Has Vascular Disease:  No    Current as of about an hour ago  1 Has Hypertension:  Yes    Current  as of about an hour ago  0 Age:  31    Current as of about an hour ago  0 Has Diabetes:  No    Current as of about an hour ago  0 Had Stroke:  No  Had TIA:  No  Had Thromboembolism:  No    Current as of about an hour ago  1 Female:  Yes    Current as of about an hour ago                  Final Clinical Impression(s) / ED Diagnoses Final diagnoses:  Atrial flutter, paroxysmal [facility])    Rx / DC Orders ED Discharge Orders          Ordered    Amb Referral to AFIB Clinic        11/30/22 1527              Pricilla Loveless, MD 11/30/22 1555

## 2022-11-30 NOTE — Sedation Documentation (Signed)
Synchronized cardioversion at 200 J by Dr. Criss Alvine

## 2022-12-05 ENCOUNTER — Other Ambulatory Visit: Payer: Self-pay | Admitting: Cardiovascular Disease

## 2022-12-06 ENCOUNTER — Encounter (HOSPITAL_COMMUNITY): Payer: Self-pay | Admitting: Physician Assistant

## 2022-12-06 ENCOUNTER — Ambulatory Visit (HOSPITAL_COMMUNITY)
Admission: RE | Admit: 2022-12-06 | Discharge: 2022-12-06 | Disposition: A | Discharge status: Home / Self Care | Payer: Medicaid Other | Source: Ambulatory Visit | Attending: Physician Assistant | Admitting: Physician Assistant

## 2022-12-06 VITALS — BP 134/90 | HR 80 | Ht 62.5 in | Wt 218.2 lb

## 2022-12-06 DIAGNOSIS — I5022 Chronic systolic (congestive) heart failure: Secondary | ICD-10-CM | POA: Diagnosis not present

## 2022-12-06 DIAGNOSIS — Z7901 Long term (current) use of anticoagulants: Secondary | ICD-10-CM | POA: Insufficient documentation

## 2022-12-06 DIAGNOSIS — I480 Paroxysmal atrial fibrillation: Secondary | ICD-10-CM | POA: Insufficient documentation

## 2022-12-06 DIAGNOSIS — I4892 Unspecified atrial flutter: Secondary | ICD-10-CM | POA: Insufficient documentation

## 2022-12-06 DIAGNOSIS — I110 Hypertensive heart disease with heart failure: Secondary | ICD-10-CM | POA: Insufficient documentation

## 2022-12-06 DIAGNOSIS — I340 Nonrheumatic mitral (valve) insufficiency: Secondary | ICD-10-CM | POA: Insufficient documentation

## 2022-12-06 DIAGNOSIS — D6869 Other thrombophilia: Secondary | ICD-10-CM | POA: Diagnosis not present

## 2022-12-06 DIAGNOSIS — R0683 Snoring: Secondary | ICD-10-CM | POA: Insufficient documentation

## 2022-12-06 MED ORDER — METOPROLOL SUCCINATE ER 50 MG PO TB24: 50.0000 mg | ORAL_TABLET | Freq: Every day | ORAL | 5 refills | Status: DC

## 2022-12-06 NOTE — Patient Instructions (Signed)
Increase metoprolol to 50 mg daily.

## 2022-12-06 NOTE — Progress Notes (Signed)
Primary Care Physician: Dot Been, FNP Primary Cardiologist: Reatha Harps, MD Electrophysiologist: None  Referring Physician: ED   Melissa Riley is a 57 y.o. 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  The patient was initially diagnosed with atrial fibrillation postoperatively following her mitral valve repair. She was initially on amiodarone but this was discontinued. She wore a cardiac monitor which showed 2% afib burden. Patient is on Eliquis for a CHADS2VASC score of 3.  On follow up today, patient presented to the ED 11/30/22 with tachypalpitations and was found to be in atrial flutter with 2:1 block. She underwent DCCV at that time. She states that she was stressed with the results of the presidential election but there were no other triggers that she could identify. She does admit to snoring and witnessed apnea. No bleeding issues on anticoagulation.    Today, she denies symptoms of palpitations, chest pain, shortness of breath, orthopnea, PND, lower extremity edema, dizziness, presyncope, syncope, bleeding, or neurologic sequela. The patient is tolerating medications without difficulties and is otherwise without complaint today.    Atrial Fibrillation Risk Factors:  she does have symptoms or diagnosis of sleep apnea. she does not have a history of rheumatic fever. she does not have a history of alcohol use. The patient does have a history of early familial atrial fibrillation or other arrhythmias. Mother has afib.  Atrial Fibrillation Management history:  Previous antiarrhythmic drugs: amiodarone  Previous cardioversions: 11/30/22 Previous ablations: none Anticoagulation history: Eliquis  ROS- All systems are reviewed and negative except as per the HPI above.  Past Medical History:  Diagnosis Date   Arthritis    Atrial  fibrillation (HCC)    Heart murmur    Hypertension    Hypothyroidism    Mitral regurgitation    Uterine fibroid     Current Outpatient Medications  Medication Sig Dispense Refill   apixaban (ELIQUIS) 5 MG TABS tablet Take 1 tablet by mouth twice daily 180 tablet 1   Ascorbic Acid (VITAMIN C PO) Take 1 tablet by mouth daily.     aspirin EC 81 MG tablet Take 81 mg by mouth daily. Swallow whole.     Cholecalciferol (VITAMIN D3) 25 MCG (1000 UT) CHEW Chew by mouth daily. 2 gummies daily     dapagliflozin propanediol (FARXIGA) 10 MG TABS tablet Take 1 tablet (10 mg total) by mouth daily before breakfast. 30 tablet 4   ferrous sulfate 324 MG TBEC Take 324 mg by mouth 2 (two) times daily.     levothyroxine (SYNTHROID) 25 MCG tablet Take 25 mcg by mouth daily before breakfast.     metoprolol succinate (TOPROL XL) 25 MG 24 hr tablet Take 1 tablet (25 mg total) by mouth daily. 30 tablet 5   Multiple Vitamins-Minerals (MULTIVITAMIN WOMEN PO) Take by mouth daily.     sacubitril-valsartan (ENTRESTO) 24-26 MG Take 1 tablet by mouth 2 (two) times daily. 120 tablet 3   spironolactone (ALDACTONE) 25 MG tablet Take 1 tablet (25 mg total) by mouth daily. 90 tablet 3   No current facility-administered medications for this encounter.    Physical Exam: BP (!) 134/90   Pulse 80   Ht 5' 2.5" (1.588 m)   Wt 99 kg   LMP  (LMP Unknown)   BMI 39.27 kg/m   GEN: Well nourished, well developed in no acute distress  NECK: No JVD; No carotid bruits CARDIAC: Regular rate and rhythm, no murmurs, rubs, gallops RESPIRATORY:  Clear to auscultation without rales, wheezing or rhonchi  ABDOMEN: Soft, non-tender, non-distended EXTREMITIES:  No edema; No deformity   Wt Readings from Last 3 Encounters:  12/06/22 99 kg  11/30/22 97.5 kg  11/10/22 99.2 kg     EKG today demonstrates  SR vs ectopic rhythm Vent. rate 80 BPM PR interval 132 ms QRS duration 96 ms QT/QTcB 398/459 ms  Echo 10/24/22 demonstrated   1.  Left ventricular ejection fraction, by estimation, is 55%. The left  ventricle has normal function. The left ventricle has no regional wall  motion abnormalities. Left ventricular diastolic parameters are consistent  with Grade I diastolic dysfunction (impaired relaxation).   2. Right ventricular systolic function is mildly reduced. The right  ventricular size is normal. There is normal pulmonary artery systolic  pressure. The estimated right ventricular systolic pressure is 22.2 mmHg.   3. Left atrial size was mildly dilated.   4. Right atrial size was mildly dilated.   5. Status post mitral valve repair. Probably moderate mitral valve  regurgitation, the mitral regurgitation was not fully visualized. Mild  mitral stenosis. The mean mitral valve gradient is 3.0 mmHg.   6. The aortic valve is tricuspid. Aortic valve regurgitation is not  visualized. No aortic stenosis is present.   7. The inferior vena cava is normal in size with greater than 50%  respiratory variability, suggesting right atrial pressure of 3 mmHg.    CHA2DS2-VASc Score = 3  The patient's score is based upon: CHF History: 1 HTN History: 1 Diabetes History: 0 Stroke History: 0 Vascular Disease History: 0 Age Score: 0 Gender Score: 1       ASSESSMENT AND PLAN: Paroxysmal Atrial Fibrillation (ICD10:  I48.0) The patient's CHA2DS2-VASc score is 3, indicating a 3.2% annual risk of stroke.   S/p DCCV 11/30/22 Patient in SR today. We discussed rhythm control options today. Patient would like to avoid further surgery at this time. Will increase Toprol to 50 mg daily. If she continues to have symptomatic episodes, will consider AAD. Continue Eliquis 5 mg BID  Secondary Hypercoagulable State (ICD10:  D68.69) The patient is at significant risk for stroke/thromboembolism based upon her CHA2DS2-VASc Score of 3.  Continue Apixaban (Eliquis).   HTN Stable, increase BB as above  VHD S/p mitral valve repair  01/2022  HFimpEF EF normalized after valve repair GDMT per primary cardiology team Fluid status appears stable  Suspected OSA  Patient has symptoms of snoring and witnessed apnea.  The importance of adequate treatment of sleep apnea was discussed today in order to improve our ability to maintain sinus rhythm long term. Will refer for sleep study.     Follow up in the AF clinic in 3 months.        Jorja Loa PA-C [facility] [address] [place], [place] [address] [phone]

## 2022-12-07 MED ORDER — DAPAGLIFLOZIN PROPANEDIOL 10 MG PO TABS: 10.0000 mg | ORAL_TABLET | Freq: Every day | ORAL | 10 refills | Status: DC

## 2022-12-08 ENCOUNTER — Other Ambulatory Visit (HOSPITAL_COMMUNITY): Payer: Self-pay | Admitting: *Deleted

## 2022-12-08 DIAGNOSIS — I480 Paroxysmal atrial fibrillation: Secondary | ICD-10-CM

## 2023-02-16 ENCOUNTER — Encounter: Payer: Self-pay | Admitting: Dermatology

## 2023-02-16 ENCOUNTER — Ambulatory Visit: Payer: Medicaid Other | Admitting: Dermatology

## 2023-02-16 VITALS — BP 147/101

## 2023-02-16 DIAGNOSIS — L910 Hypertrophic scar: Secondary | ICD-10-CM

## 2023-02-16 MED ORDER — TACROLIMUS 0.1 % EX OINT: TOPICAL_OINTMENT | Freq: Two times a day (BID) | CUTANEOUS | 0 refills | Status: DC

## 2023-02-16 MED ORDER — TRIAMCINOLONE ACETONIDE 40 MG/ML IJ SUSP
40.0000 mg | Freq: Once | INTRAMUSCULAR | Status: AC
  Administered 2023-02-16: 40 mg via INTRAMUSCULAR

## 2023-02-16 NOTE — Patient Instructions (Addendum)
Hello Melissa Riley,  Thank you for visiting my office today. Your dedication to addressing the concerns with your surgical scar and improving your health is greatly appreciated. Here is a summary of our treatment plan from today's consultation:  Kenalog Injections:   Treatment Initiated: We started with Kenalog 40, one CC, injected into the keloids today.   Frequency: We will continue these injections every eight weeks.   Goal: Continue until the keloids are flat and symptoms are resolved.  Topical Tacrolimus:   Application: Apply tacrolimus ointment twice daily on the keloids.  Scar Treatment Sample:   Usage: Use the provided sample to help soften the keloids.  Follow-Up Appointments:   Next Appointment: Scheduled for 6 days from today to assess progress.   Frequency: Further follow-ups will be every couple of weeks.   Purpose: To adjust treatment as necessary.  Prescriptions:   Tacrolimus: Your prescription has been sent to your pharmacy.  Please adhere to the treatment plan as we discussed. If you have any questions or concerns before our next meeting, do not hesitate to contact our office.  Best regards,  Dr. [facility]   Important Information  Due to recent changes in healthcare laws, you may see results of your pathology and/or laboratory studies on MyChart before the doctors have had a chance to review them. We understand that in some cases there may be results that are confusing or concerning to you. Please understand that not all results are received at the same time and often the doctors may need to interpret multiple results in order to provide you with the best plan of care or course of treatment. Therefore, we ask that you please give [place] 2 business days to thoroughly review all your results before contacting the office for clarification. Should we see a critical lab result, you will be contacted sooner.   If You Need Anything After Your Visit  If you  have any questions or concerns for your doctor, please call our main line at [phone] If no one answers, please leave a voicemail as directed and we will return your call as soon as possible. Messages left after 4 pm will be answered the following business day.   You may also send [place] a message via MyChart. We typically respond to MyChart messages within 1-2 business days.  For prescription refills, please ask your pharmacy to contact our office. Our fax number is [phone].  If you have an urgent issue when the clinic is closed that cannot wait until the next business day, you can page your doctor at the number below.    Please note that while we do our best to be available for urgent issues outside of office hours, we are not available 24/7.   If you have an urgent issue and are unable to reach [place], you may choose to seek medical care at your doctor's office, retail clinic, urgent care center, or emergency room.  If you have a medical emergency, please immediately call 911 or go to the emergency department. In the event of inclement weather, please call our main line at [phone] for an update on the status of any delays or closures.  Dermatology Medication Tips: Please keep the boxes that topical medications come in in order to help keep track of the instructions about where and how to use these. Pharmacies typically print the medication instructions only on the boxes and not directly on the medication tubes.   If your medication is too expensive, please  contact our office at [phone] or send [place] a message through MyChart.   We are unable to tell what your co-pay for medications will be in advance as this is different depending on your insurance coverage. However, we may be able to find a substitute medication at lower cost or fill out paperwork to get insurance to cover a needed medication.   If a prior authorization is required to get your medication covered by your insurance  company, please allow [place] 1-2 business days to complete this process.  Drug prices often vary depending on where the prescription is filled and some pharmacies may offer cheaper prices.  The website www.goodrx.com contains coupons for medications through different pharmacies. The prices here do not account for what the cost may be with help from insurance (it may be cheaper with your insurance), but the website can give you the price if you did not use any insurance.  - You can print the associated coupon and take it with your prescription to the pharmacy.  - You may also stop by our office during regular business hours and pick up a GoodRx coupon card.  - If you need your prescription sent electronically to a different pharmacy, notify our office through [facility] or by phone at [phone]

## 2023-02-16 NOTE — Progress Notes (Signed)
   New Patient Visit   Subjective  Melissa Riley is a 58 y.o. female who presents for the following: New Pt - Keloids  Patient states she has keloids located at the chest and abdomen that she would like to have examined. Patient reports the areas have been there for 1 year post heart valve surgery. She reports the areas are bothersome (painful & itchy).Patient rates irritation 7 out of 10. She states that the areas have not spread. Patient reports she has previously been treated for these areas by PCP but was referred to dermatology for treatment. Patient denies Hx of bx. Patient denies family history of skin cancer(s).   The following portions of the chart were reviewed this encounter and updated as appropriate: medications, allergies, medical history  Review of Systems:  No other skin or systemic complaints except as noted in HPI or Assessment and Plan.  Objective  Well appearing patient in no apparent distress; mood and affect are within normal limits.   A focused examination was performed of the following areas: chest & abdomen   Relevant exam findings are noted in the Assessment and Plan.       Keloid Scars Post Cardiac Valve Surgery Assessment: Patient presents with keloid scars on the chest area, developed a few months post cardiac valve surgery performed one year ago. Scars are active, tender, itchy, and sometimes painful. Previous treatment with a topical medication (possibly triamcinolone) was ineffective.  Plan:   Administer Kenalog 40 (1 cc) injections into keloid scars.   Prescribe tacrolimus for topical application.   Provide scar treatment sample for topical use.   Schedule follow-up in 2 weeks.   Plan for repeated injections every 8 weeks until scars are flat and asymptomatic.  Marland Kitchen  Chest - Medial [facility]), abdomen Procedure Note Intralesional Injection  Location: mid chest & abdomen  Informed Consent: Discussed risks (infection, pain, bleeding, bruising,  thinning of the skin, loss of skin pigment, lack of resolution, and recurrence of lesion) and benefits of the procedure, as well as the alternatives. Informed consent was obtained. Preparation: The area was prepared a standard fashion.  Anesthesia: n/a  Procedure Details: An intralesional injection was performed with Kenalog 40 mg/cc. 1.0 cc in total were injected. NDC #: 1610-9604-54 Exp: 05/2024  Total number of injections: 16  Plan: The patient was instructed on post-op care. Recommend OTC analgesia as needed for pain.   Assessment & Plan   KELOID Exam shows Firm pink/brown dermal papule(s)/plaque(s) located at the mid-chest and abdomen.   Treatment Plan: - Rx Tacrolimus 0.1% - to apply to affected areas BID.     KELOID (2) Chest - Medial [facility]), abdomen Intralesional injection - Chest - Medial [facility]), abdomen .qk  tacrolimus (PROTOPIC) 0.1 % ointment - Chest - Medial [facility]), abdomen Apply topically 2 (two) times daily. Related Medications triamcinolone acetonide (KENALOG-40) injection 40 mg   Return in about 6 weeks (around 03/30/2023) for keloid injections.    Documentation: I have reviewed the above documentation for accuracy and completeness, and I agree with the above.   I, Shirron Marcha Solders, CMA, am acting as scribe for [employer], DO.   Langston Reusing, DO

## 2023-02-21 ENCOUNTER — Telehealth: Payer: Self-pay

## 2023-02-21 NOTE — Telephone Encounter (Signed)
PA initiated CMM Key: [id number]

## 2023-02-21 NOTE — Telephone Encounter (Signed)
Approved.  This drug has been approved. Approved quantity: 100 units per 30 day(s). The drug has been approved from 02/07/2023 to 02/21/2024.

## 2023-03-08 ENCOUNTER — Encounter (HOSPITAL_COMMUNITY): Payer: Self-pay | Admitting: Physician Assistant

## 2023-03-08 ENCOUNTER — Ambulatory Visit (HOSPITAL_COMMUNITY)
Admission: RE | Admit: 2023-03-08 | Discharge: 2023-03-08 | Disposition: A | Discharge status: Home / Self Care | Payer: Medicaid Other | Source: Ambulatory Visit | Attending: Physician Assistant | Admitting: Physician Assistant

## 2023-03-08 VITALS — BP 114/92 | HR 107 | Ht 62.5 in | Wt 226.8 lb

## 2023-03-08 DIAGNOSIS — I110 Hypertensive heart disease with heart failure: Secondary | ICD-10-CM | POA: Diagnosis present

## 2023-03-08 DIAGNOSIS — I340 Nonrheumatic mitral (valve) insufficiency: Secondary | ICD-10-CM | POA: Diagnosis present

## 2023-03-08 DIAGNOSIS — I5022 Chronic systolic (congestive) heart failure: Secondary | ICD-10-CM | POA: Insufficient documentation

## 2023-03-08 DIAGNOSIS — Z7901 Long term (current) use of anticoagulants: Secondary | ICD-10-CM | POA: Diagnosis not present

## 2023-03-08 DIAGNOSIS — Z9889 Other specified postprocedural states: Secondary | ICD-10-CM | POA: Insufficient documentation

## 2023-03-08 DIAGNOSIS — I4892 Unspecified atrial flutter: Secondary | ICD-10-CM | POA: Insufficient documentation

## 2023-03-08 DIAGNOSIS — D6869 Other thrombophilia: Secondary | ICD-10-CM | POA: Diagnosis not present

## 2023-03-08 DIAGNOSIS — I480 Paroxysmal atrial fibrillation: Secondary | ICD-10-CM | POA: Diagnosis not present

## 2023-03-08 MED ORDER — FLECAINIDE ACETATE 50 MG PO TABS: 50.0000 mg | ORAL_TABLET | Freq: Two times a day (BID) | ORAL | 3 refills | Status: DC

## 2023-03-08 NOTE — Progress Notes (Signed)
Primary Care Physician: Dot Been, FNP Primary Cardiologist: Reatha Harps, MD Electrophysiologist: None  Referring Physician: ED   Melissa Riley is a 58 y.o. female with a history of severe MR s/p repair 01/2022, HFrEF, HTN, atrial flutter, atrial fibrillation who presents for follow up in the [facility].  The patient was initially diagnosed with atrial fibrillation postoperatively following her mitral valve repair. She was initially on amiodarone but this was discontinued. She wore a cardiac monitor which showed 2% afib burden. Patient is on Eliquis for a CHADS2VASC score of 3. Patient presented to the ED 11/30/22 with tachypalpitations and was found to be in atrial flutter with 2:1 block. She underwent DCCV at that time.  Patient returns for follow up for atrial fibrillation. She is in afib today with symptoms of palpitations. She noticed the palpitations after being ill with influenza about two weeks ago. No bleeding issues on anticoagulation.   Today, he denies symptoms of chest pain, shortness of breath, orthopnea, PND, lower extremity edema, dizziness, presyncope, syncope, bleeding, or neurologic sequela. The patient is tolerating medications without difficulties and is otherwise without complaint today.    Atrial Fibrillation Risk Factors:  she does have symptoms or diagnosis of sleep apnea. she does not have a history of rheumatic fever. she does not have a history of alcohol use. The patient does have a history of early familial atrial fibrillation or other arrhythmias. Mother has afib.  Atrial Fibrillation Management history:  Previous antiarrhythmic drugs: amiodarone  Previous cardioversions: 11/30/22 Previous ablations: none Anticoagulation history: Eliquis  ROS- All systems are reviewed and negative except as per the HPI above.  Past Medical History:  Diagnosis Date   Arthritis    Atrial fibrillation (HCC)    Heart murmur     Hypertension    Hypothyroidism    Mitral regurgitation    Uterine fibroid     Current Outpatient Medications  Medication Sig Dispense Refill   apixaban (ELIQUIS) 5 MG TABS tablet Take 1 tablet by mouth twice daily 180 tablet 1   Ascorbic Acid (VITAMIN C PO) Take 1 tablet by mouth daily.     aspirin EC 81 MG tablet Take 81 mg by mouth daily. Swallow whole.     Cholecalciferol (VITAMIN D3) 25 MCG (1000 UT) CHEW Chew by mouth daily. 2 gummies daily     dapagliflozin propanediol (FARXIGA) 10 MG TABS tablet Take 1 tablet (10 mg total) by mouth daily before breakfast. 30 tablet 10   ferrous sulfate 324 MG TBEC Take 324 mg by mouth 2 (two) times daily.     levothyroxine (SYNTHROID) 25 MCG tablet Take 25 mcg by mouth daily before breakfast.     metoprolol succinate (TOPROL XL) 50 MG 24 hr tablet Take 1 tablet (50 mg total) by mouth daily. 30 tablet 5   Multiple Vitamins-Minerals (MULTIVITAMIN WOMEN PO) Take by mouth daily.     sacubitril-valsartan (ENTRESTO) 24-26 MG Take 1 tablet by mouth 2 (two) times daily. 120 tablet 3   spironolactone (ALDACTONE) 25 MG tablet Take 1 tablet (25 mg total) by mouth daily. 90 tablet 3   tacrolimus (PROTOPIC) 0.1 % ointment Apply topically 2 (two) times daily. 100 g 0   No current facility-administered medications for this encounter.    Physical Exam: BP (!) 114/92   Pulse (!) 107   Ht 5' 2.5" (1.588 m)   Wt 102.9 kg   LMP  (LMP Unknown)   BMI 40.82 kg/m  GEN: Well nourished, well developed in no acute distress CARDIAC: Irregularly irregular rate and rhythm, no murmurs, rubs, gallops RESPIRATORY:  Clear to auscultation without rales, wheezing or rhonchi  ABDOMEN: Soft, non-tender, non-distended EXTREMITIES:  No edema; No deformity    Wt Readings from Last 3 Encounters:  03/08/23 102.9 kg  12/06/22 99 kg  11/30/22 97.5 kg     EKG today demonstrates  Atypical atrial flutter with variable block Vent. rate 107 BPM PR interval * ms QRS duration  92 ms QT/QTcB 348/464 ms   Echo 10/24/22 demonstrated   1. Left ventricular ejection fraction, by estimation, is 55%. The left  ventricle has normal function. The left ventricle has no regional wall  motion abnormalities. Left ventricular diastolic parameters are consistent  with Grade I diastolic dysfunction (impaired relaxation).   2. Right ventricular systolic function is mildly reduced. The right  ventricular size is normal. There is normal pulmonary artery systolic  pressure. The estimated right ventricular systolic pressure is 22.2 mmHg.   3. Left atrial size was mildly dilated.   4. Right atrial size was mildly dilated.   5. Status post mitral valve repair. Probably moderate mitral valve  regurgitation, the mitral regurgitation was not fully visualized. Mild  mitral stenosis. The mean mitral valve gradient is 3.0 mmHg.   6. The aortic valve is tricuspid. Aortic valve regurgitation is not  visualized. No aortic stenosis is present.   7. The inferior vena cava is normal in size with greater than 50%  respiratory variability, suggesting right atrial pressure of 3 mmHg.    CHA2DS2-VASc Score = 3  The patient's score is based upon: CHF History: 1 HTN History: 1 Diabetes History: 0 Stroke History: 0 Vascular Disease History: 0 Age Score: 0 Gender Score: 1       ASSESSMENT AND PLAN: Paroxysmal Atrial Fibrillation/atrial flutter The patient's CHA2DS2-VASc score is 3, indicating a 3.2% annual risk of stroke.   Patient in atrial flutter today, suspect episode triggered by influenza infection. We discussed rhythm control options today. Will start flecainide 50 mg BID. Her echo showed normalization of EF and LHC in 2023 showed no CAD. If she does not chemically convert, will increase dose to 100 mg BID and arrange for DCCV.  Continue Toprol 50 mg daily Continue Eliquis 5 mg BID  Secondary Hypercoagulable State (ICD10:  D68.69) The patient is at significant risk for  stroke/thromboembolism based upon her CHA2DS2-VASc Score of 3.  Continue Apixaban (Eliquis). No bleeding issues.   HTN Stable on current regimen  VHD S/p mitral valve repair 01/2022  HFrecEF EF normalized after valve repair GDMT per primary cardiology team Fluid status appears stable  Suspected OSA  Referred for sleep study at last visit Will reach out to sleep medicine team to get her scheduled.     Follow up in the AF clinic next week for ECG.       Jorja Loa PA-C Afib Clinic [facility] [address] [place], [place] [address] [phone]

## 2023-03-08 NOTE — Patient Instructions (Signed)
Start Flecainide 50mg  twice a day

## 2023-03-15 ENCOUNTER — Ambulatory Visit (HOSPITAL_COMMUNITY)
Admission: RE | Admit: 2023-03-15 | Discharge: 2023-03-15 | Disposition: A | Discharge status: Home / Self Care | Payer: Medicaid Other | Source: Ambulatory Visit | Attending: Physician Assistant | Admitting: Physician Assistant

## 2023-03-15 DIAGNOSIS — Z79899 Other long term (current) drug therapy: Secondary | ICD-10-CM | POA: Insufficient documentation

## 2023-03-15 DIAGNOSIS — I480 Paroxysmal atrial fibrillation: Secondary | ICD-10-CM | POA: Diagnosis not present

## 2023-03-15 DIAGNOSIS — Z5181 Encounter for therapeutic drug level monitoring: Secondary | ICD-10-CM | POA: Diagnosis not present

## 2023-03-15 DIAGNOSIS — D6869 Other thrombophilia: Secondary | ICD-10-CM | POA: Diagnosis present

## 2023-03-15 MED ORDER — FLECAINIDE ACETATE 50 MG PO TABS: 100.0000 mg | ORAL_TABLET | Freq: Two times a day (BID) | ORAL | Status: DC

## 2023-03-15 NOTE — Progress Notes (Signed)
Patient returns for ECG after starting flecainide. ECG shows:  Atypical atrial flutter with 2:1 block Vent. rate 90 BPM PR interval * ms QRS duration 102 ms QT/QTcB 374/457 ms  Patient feels she was in SR until this AM. Will increase dose to 100 mg BID and recheck ECG next week.

## 2023-03-15 NOTE — Patient Instructions (Signed)
Increase flecainide 100mg  twice a day

## 2023-03-22 ENCOUNTER — Ambulatory Visit (HOSPITAL_COMMUNITY)
Admission: RE | Admit: 2023-03-22 | Discharge: 2023-03-22 | Disposition: A | Discharge status: Home / Self Care | Payer: Medicaid Other | Source: Ambulatory Visit | Attending: Physician Assistant | Admitting: Physician Assistant

## 2023-03-22 VITALS — HR 84

## 2023-03-22 DIAGNOSIS — D6869 Other thrombophilia: Secondary | ICD-10-CM | POA: Diagnosis not present

## 2023-03-22 DIAGNOSIS — I4819 Other persistent atrial fibrillation: Secondary | ICD-10-CM

## 2023-03-22 DIAGNOSIS — Z79899 Other long term (current) drug therapy: Secondary | ICD-10-CM | POA: Diagnosis present

## 2023-03-22 DIAGNOSIS — I480 Paroxysmal atrial fibrillation: Secondary | ICD-10-CM | POA: Diagnosis not present

## 2023-03-22 DIAGNOSIS — Z5181 Encounter for therapeutic drug level monitoring: Secondary | ICD-10-CM | POA: Diagnosis present

## 2023-03-22 LAB — BASIC METABOLIC PANEL
Anion gap: 8 (ref 5–15)
BUN: 13 mg/dL (ref 6–20)
CO2: 25 mmol/L (ref 22–32)
Calcium: 8.9 mg/dL (ref 8.9–10.3)
Chloride: 105 mmol/L (ref 98–111)
Creatinine, Ser: 0.92 mg/dL (ref 0.44–1.00)
GFR, Estimated: >60 mL/min (ref >60)
Glucose, Bld: 95 mg/dL (ref 70–99)
Potassium: 4.6 mmol/L (ref 3.5–5.1)
Sodium: 138 mmol/L (ref 135–145)

## 2023-03-22 LAB — CBC
HCT: 46.3 % — ABNORMAL HIGH (ref 36.0–46.0)
Hemoglobin: 14.9 g/dL (ref 12.0–15.0)
MCH: 31.2 pg (ref 26.0–34.0)
MCHC: 32.2 g/dL (ref 30.0–36.0)
MCV: 97.1 fL (ref 80.0–100.0)
Platelets: 235 10*3/uL (ref 150–400)
RBC: 4.77 MIL/uL (ref 3.87–5.11)
RDW: 14.7 % (ref 11.5–15.5)
WBC: 6.1 10*3/uL (ref 4.0–10.5)
nRBC: 0 % (ref 0.0–0.2)

## 2023-03-22 MED ORDER — FLECAINIDE ACETATE 100 MG PO TABS: 100.0000 mg | ORAL_TABLET | Freq: Two times a day (BID) | ORAL | 6 refills | Status: DC

## 2023-03-22 NOTE — Patient Instructions (Addendum)
 Cardioversion scheduled for: Wednesday, March 5th   - Arrive at the [facility] and go to admitting at 8:00AM   - Do not eat or drink anything after midnight the night prior to your procedure.   - Take all your morning medication (except diabetic medications) with a sip of water prior to arrival.  - You will not be able to drive home after your procedure.    - Do NOT miss any doses of your blood thinner - if you should miss a dose please notify our office immediately.   - If you feel as if you go back into normal rhythm prior to scheduled cardioversion, please notify our office immediately.   If your procedure is canceled in the cardioversion suite you will be charged a cancellation fee.     Hold below medications 72 hours prior to scheduled procedure/anesthesia. Restart medication on the following day after scheduled procedure/anesthesia  Dapagliflozin Marcelline Deist)    For those patients who have a scheduled procedure/anesthesia on the same day of the week as their dose, hold the medication on the day of surgery.  They can take their scheduled dose the week before.  **Patients on the above medications scheduled for elective procedures that have not held the medication for the appropriate amount of time are at risk of cancellation or change in the anesthetic plan.

## 2023-03-22 NOTE — H&P (View-Only) (Signed)
 Patient returns for ECG after increasing flecainide. ECG shows:  Afib Vent. rate 84 BPM PR interval * ms QRS duration 112 ms QT/QTcB 402/475 ms  Patient remains in afib despite increasing flecainide. After discussing the risks and benefits, will arrange for DCCV. Check bmet/cbc today. Follow up in the AF clinic post DCCV.   Informed Consent   Shared Decision Making/Informed Consent The risks (stroke, cardiac arrhythmias rarely resulting in the need for a temporary or permanent pacemaker, skin irritation or burns and complications associated with conscious sedation including aspiration, arrhythmia, respiratory failure and death), benefits (restoration of normal sinus rhythm) and alternatives of a direct current cardioversion were explained in detail to Melissa Riley and she agrees to proceed.

## 2023-03-22 NOTE — Progress Notes (Addendum)
 Patient returns for ECG after increasing flecainide. ECG shows:  Afib Vent. rate 84 BPM PR interval * ms QRS duration 112 ms QT/QTcB 402/475 ms  Patient remains in afib despite increasing flecainide. After discussing the risks and benefits, will arrange for DCCV. Check bmet/cbc today. Follow up in the AF clinic post DCCV.   Informed Consent   Shared Decision Making/Informed Consent The risks (stroke, cardiac arrhythmias rarely resulting in the need for a temporary or permanent pacemaker, skin irritation or burns and complications associated with conscious sedation including aspiration, arrhythmia, respiratory failure and death), benefits (restoration of normal sinus rhythm) and alternatives of a direct current cardioversion were explained in detail to Melissa Riley and she agrees to proceed.

## 2023-03-28 ENCOUNTER — Encounter: Payer: Self-pay | Admitting: Dermatology

## 2023-03-28 ENCOUNTER — Ambulatory Visit (INDEPENDENT_AMBULATORY_CARE_PROVIDER_SITE_OTHER): Payer: Medicaid Other | Admitting: Dermatology

## 2023-03-28 DIAGNOSIS — L910 Hypertrophic scar: Secondary | ICD-10-CM | POA: Diagnosis not present

## 2023-03-28 MED ORDER — TACROLIMUS 0.1 % EX OINT: TOPICAL_OINTMENT | Freq: Two times a day (BID) | CUTANEOUS | 1 refills | Status: DC

## 2023-03-28 MED ORDER — TRIAMCINOLONE ACETONIDE 40 MG/ML IJ SUSP
40.0000 mg | Freq: Once | INTRAMUSCULAR | Status: AC
  Administered 2023-03-28: 40 mg via INTRAMUSCULAR

## 2023-03-28 NOTE — Progress Notes (Signed)
 Called patient with pre-procedure instructions for tomorrow.   Patient informed of:   Time to arrive for procedure 0800 Remain NPO past midnight.  Must have a ride home and a responsible adult to remain with them for 24 hours post procedure.  Confirmed blood thinner Eliquis Confirmed no breaks in taking blood thinner for 3+ weeks prior to procedure. Confirmed patient stopped all GLP-1s and GLP-2s for at least one week before procedure. Farxiga last dose 03/26/23

## 2023-03-28 NOTE — Patient Instructions (Addendum)
 Hello Mr. Melissa Riley,  Thank you for visiting [place] today.   Here is a summary of the key instructions and next steps from today's consultation:  Biopsy Procedures: today we performed a biopsy of 2 suspicious lesions on your face   A 4mm pink-brown papule on the left malar cheek, to investigate the possibility of pigmented Basal Cell Carcinoma.   A 4mm pedunculated round papule on the right lower eyelid, to distinguish between a skin tag and a nevus.    Cyst on you thigh Ultrasound: An order will be placed for an ultrasound to evaluate a 6cm x 5cm cystic lesion on your right lateral thigh. This will help confirm it's a cyst and assess any blood supply, crucial for planning any surgical intervention.  Surgical Consultation: Based on the ultrasound findings, a consultation with Dr. Pasi will be arranged to discuss the potential surgical removal of the cyst.  Ultrasound Location: The outpatient imaging at the [facility] location of [place] will be where you'll need to go for your ultrasound.  Post-Procedure Care: Following the biopsies, it's important to:   Keep the areas clean using soap and water.   Apply Aquaphor for healing.   Cover with a band-aid according to your preference.  Follow-Up: The results from the biopsies will be communicated through MyChart. Should there be any findings that necessitate further treatment, we will reach out to you directly.  Please make sure to schedule your ultrasound promptly and inform us of any changes in your condition. Should you have any questions or concerns, please feel free to contact our office.  Warm regards,  Dr. Langston Reusing [facility]at[facility]  Important Information  Due to recent changes in healthcare laws, you may see results of your pathology and/or laboratory studies on MyChart before the doctors have had a chance to review them. We understand that in some cases there may be results that are confusing or concerning to you. Please understand  that not all results are received at the same time and often the doctors may need to interpret multiple results in order to provide you with the best plan of care or course of treatment. Therefore, we ask that you please give [place] 2 business days to thoroughly review all your results before contacting the office for clarification. Should we see a critical lab result, you will be contacted sooner.   If You Need Anything After Your Visit  If you have any questions or concerns for your doctor, please call our main line at [phone] If no one answers, please leave a voicemail as directed and we will return your call as soon as possible. Messages left after 4 pm will be answered the following business day.   You may also send [place] a message via MyChart. We typically respond to MyChart messages within 1-2 business days.  For prescription refills, please ask your pharmacy to contact our office. Our fax number is [phone].  If you have an urgent issue when the clinic is closed that cannot wait until the next business day, you can page your doctor at the number below.    Please note that while we do our best to be available for urgent issues outside of office hours, we are not available 24/7.   If you have an urgent issue and are unable to reach [place], you may choose to seek medical care at your doctor's office, retail clinic, urgent care center, or emergency room.  If you have a medical emergency, please immediately call 911 or  go to the emergency department. In the event of inclement weather, please call our main line at [phone] for an update on the status of any delays or closures.  Dermatology Medication Tips: Please keep the boxes that topical medications come in in order to help keep track of the instructions about where and how to use these. Pharmacies typically print the medication instructions only on the boxes and not directly on the medication tubes.   If your medication is too  expensive, please contact our office at [phone] or send [place] a message through MyChart.   We are unable to tell what your co-pay for medications will be in advance as this is different depending on your insurance coverage. However, we may be able to find a substitute medication at lower cost or fill out paperwork to get insurance to cover a needed medication.   If a prior authorization is required to get your medication covered by your insurance company, please allow [place] 1-2 business days to complete this process.  Drug prices often vary depending on where the prescription is filled and some pharmacies may offer cheaper prices.  The website www.goodrx.com contains coupons for medications through different pharmacies. The prices here do not account for what the cost may be with help from insurance (it may be cheaper with your insurance), but the website can give you the price if you did not use any insurance.  - You can print the associated coupon and take it with your prescription to the pharmacy.  - You may also stop by our office during regular business hours and pick up a GoodRx coupon card.  - If you need your prescription sent electronically to a different pharmacy, notify our office through [facility] or by phone at [phone]

## 2023-03-28 NOTE — Progress Notes (Signed)
   Follow-Up Visit   Subjective  Melissa Riley is a 58 y.o. female who presents for the following: keloid  Patient present today for follow up visit. Patient was last evaluated on 02/16/23. At this visit patient was prescribed Tacrolimus. Patient reports sxs are better but she mentioned that she is in need of refills. Patient denies medication changes.  The following portions of the chart were reviewed this encounter and updated as appropriate: medications, allergies, medical history  Review of Systems:  No other skin or systemic complaints except as noted in HPI or Assessment and Plan.  Objective  Well appearing patient in no apparent distress; mood and affect are within normal limits.   A focused examination was performed of the following areas: chest and abdomen   Relevant exam findings are noted in the Assessment and Plan.       Chest - Medial (Center) (10), abdomen (3) Procedure Note Intralesional Injection  Location: mid chest & abdomen  Informed Consent: Discussed risks (infection, pain, bleeding, bruising, thinning of the skin, loss of skin pigment, lack of resolution, and recurrence of lesion) and benefits of the procedure, as well as the alternatives. Informed consent was obtained. Preparation: The area was prepared a standard fashion.  Anesthesia: n/a  Procedure Details: An intralesional injection was performed with Kenalog 40 mg/cc. 1.0 cc in total were injected. NDC #: 0981-1914-78 Exp: 05/2024  Total number of injections: 13  Plan: The patient was instructed on post-op care. Recommend OTC analgesia as needed for pain.   Assessment & Plan    KELOID (13) Chest - Medial (Center) (10), abdomen (3) tacrolimus (PROTOPIC) 0.1 % ointment - Chest - Medial (Center) (10), abdomen (3) Apply topically 2 (two) times daily. Related Medications triamcinolone acetonide (KENALOG-40) injection 40 mg   Return in about 6 weeks (around 05/09/2023) for  keloid.    Documentation: I have reviewed the above documentation for accuracy and completeness, and I agree with the ab  I, Elder Love, CMA, am acting as scribe for Langston Reusing, DO.   Langston Reusing, DO

## 2023-03-29 ENCOUNTER — Encounter (HOSPITAL_COMMUNITY): Payer: Self-pay | Admitting: Cardiology

## 2023-03-29 ENCOUNTER — Ambulatory Visit (HOSPITAL_COMMUNITY)
Admission: RE | Admit: 2023-03-29 | Discharge: 2023-03-29 | Disposition: A | Discharge status: Home / Self Care | Payer: Medicaid Other | Attending: Cardiology | Admitting: Cardiology

## 2023-03-29 ENCOUNTER — Other Ambulatory Visit: Payer: Self-pay

## 2023-03-29 ENCOUNTER — Ambulatory Visit (HOSPITAL_COMMUNITY): Admitting: Anesthesiology

## 2023-03-29 ENCOUNTER — Encounter (HOSPITAL_COMMUNITY)
Admission: RE | Disposition: A | Discharge status: Home / Self Care | Payer: Self-pay | Source: Home / Self Care | Attending: Cardiology

## 2023-03-29 DIAGNOSIS — I10 Essential (primary) hypertension: Secondary | ICD-10-CM

## 2023-03-29 DIAGNOSIS — I4891 Unspecified atrial fibrillation: Secondary | ICD-10-CM

## 2023-03-29 DIAGNOSIS — E039 Hypothyroidism, unspecified: Secondary | ICD-10-CM

## 2023-03-29 HISTORY — PX: CARDIOVERSION: EP1203

## 2023-03-29 SURGERY — CARDIOVERSION (CATH LAB)
Anesthesia: General

## 2023-03-29 MED ORDER — PROPOFOL 10 MG/ML IV BOLUS
INTRAVENOUS | Status: DC | PRN
Start: 2023-03-29 — End: 2023-03-29
  Administered 2023-03-29: 70 mg via INTRAVENOUS

## 2023-03-29 MED ORDER — LIDOCAINE 2% (20 MG/ML) 5 ML SYRINGE
INTRAMUSCULAR | Status: DC | PRN
  Administered 2023-03-29: 60 mg via INTRAVENOUS

## 2023-03-29 SURGICAL SUPPLY — 1 items: PAD DEFIB RADIO PHYSIO CONN (PAD) ×2 IMPLANT

## 2023-03-29 NOTE — Interval H&P Note (Signed)
 History and Physical Interval Note:  03/29/2023 8:10 AM  Melissa Riley  has presented today for surgery, with the diagnosis of AFIB.  The various methods of treatment have been discussed with the patient and family. After consideration of risks, benefits and other options for treatment, the patient has consented to  Procedure(s): CARDIOVERSION (N/A) as a surgical intervention.  The patient's history has been reviewed, patient examined, no change in status, stable for surgery.  I have reviewed the patient's chart and labs.  Questions were answered to the patient's satisfaction.     [name]

## 2023-03-29 NOTE — Transfer of Care (Signed)
 Immediate Anesthesia Transfer of Care Note  Patient: Melissa Riley  Procedure(s) Performed: CARDIOVERSION  Patient Location: Cath Lab  Anesthesia Type:MAC  Level of Consciousness: awake, alert , oriented, and patient cooperative  Airway & Oxygen Therapy: Patient Spontanous Breathing and Patient connected to face mask oxygen  Post-op Assessment: Report given to RN, Post -op Vital signs reviewed and stable, Patient moving all extremities, Patient moving all extremities X 4, and Patient able to stick tongue midline  Post vital signs: Reviewed and stable  Last Vitals:  Vitals Value Taken Time  BP    Temp 36.6 C 03/29/23 0854  Pulse 63 03/29/23 0855  Resp 23 03/29/23 0855  SpO2 100 % 03/29/23 0855  Vitals shown include unfiled device data.  Last Pain:  Vitals:   03/29/23 0854  TempSrc: Temporal  PainSc: 0-No pain         Complications: No notable events documented.

## 2023-03-29 NOTE — CV Procedure (Signed)
   Electrical Cardioversion Procedure Note Melissa Riley [id number] [birth year]  Procedure: Electrical Cardioversion Indications:  Atrial Fibrillation  Time Out: Verified patient identification, verified procedure,medications/allergies/relevent history reviewed, required imaging and test results available.  Performed  Procedure Details  The patient signed informed consent.   The patient was NPO past midnight. Has had therapeutic anticoagulation with ELiquis greater than 3 weeks. The patient denies any interruption of anticoagulation.  Anesthesia was administered by Dr. Jossie Ng.  Adequate airway was maintained throughout and vital followed per protocol.  He was cardioverted x 1 with 200J of biphasic synchronized energy.  He converted to NSR.  There were no apparent complications.  The patient tolerated the procedure well and had normal neuro status and respiratory status post procedure with vitals stable as recorded elsewhere.     IMPRESSION:  Successful cardioversion of atrial fibrillation   Follow up:  We will arrange follow up with primary cardiologist.  He will continue on current medical therapy.  The patient advised to continue anticoagulation.  Melissa Riley 03/29/2023, 8:54 AM

## 2023-03-29 NOTE — Anesthesia Preprocedure Evaluation (Signed)
 Anesthesia Evaluation  Patient identified by MRN, date of birth, ID band Patient awake    Reviewed: Allergy & Precautions, NPO status , Patient's Chart, lab work & pertinent test results  Airway Mallampati: II  TM Distance: >3 FB Neck ROM: Full    Dental no notable dental hx.    Pulmonary neg pulmonary ROS   Pulmonary exam normal        Cardiovascular hypertension, Pt. on medications and Pt. on home beta blockers + dysrhythmias Atrial Fibrillation + Valvular Problems/Murmurs MR  Rhythm:Regular Rate:Normal     Neuro/Psych negative neurological ROS  negative psych ROS   GI/Hepatic negative GI ROS, Neg liver ROS,,,  Endo/Other    Renal/GU negative Renal ROS  negative genitourinary   Musculoskeletal  (+) Arthritis , Osteoarthritis,    Abdominal Normal abdominal exam  (+)   Peds  Hematology Lab Results      Component                Value               Date                      WBC                      6.1                 03/22/2023                HGB                      14.9                03/22/2023                HCT                      46.3 (H)            03/22/2023                MCV                      97.1                03/22/2023                PLT                      235                 03/22/2023              Anesthesia Other Findings   Reproductive/Obstetrics                             Anesthesia Physical Anesthesia Plan  ASA: 3  Anesthesia Plan: General   Post-op Pain Management:    Induction: Intravenous  PONV Risk Score and Plan: 3 and Treatment may vary due to age or medical condition  Airway Management Planned: Mask  Additional Equipment: None  Intra-op Plan:   Post-operative Plan:   Informed Consent: I have reviewed the patients History and Physical, chart, labs and discussed the procedure including the risks, benefits and alternatives for the proposed  anesthesia with the patient or authorized representative who has  indicated his/her understanding and acceptance.     Dental advisory given  Plan Discussed with: CRNA  Anesthesia Plan Comments:        Anesthesia Quick Evaluation

## 2023-03-29 NOTE — Anesthesia Postprocedure Evaluation (Signed)
 Anesthesia Post Note  Patient: Melissa Riley  Procedure(s) Performed: CARDIOVERSION     Patient location during evaluation: PACU Anesthesia Type: General Level of consciousness: awake and alert Pain management: pain level controlled Vital Signs Assessment: post-procedure vital signs reviewed and stable Respiratory status: spontaneous breathing, nonlabored ventilation, respiratory function stable and patient connected to nasal cannula oxygen Cardiovascular status: blood pressure returned to baseline and stable Postop Assessment: no apparent nausea or vomiting Anesthetic complications: no   No notable events documented.  Last Vitals:  Vitals:   03/29/23 0757 03/29/23 0854  BP: (!) 134/95   Pulse: 85 64  Resp: 17   Temp: (!) 36.4 C 36.6 C  SpO2: 97%     Last Pain:  Vitals:   03/29/23 0854  TempSrc: Temporal  PainSc: 0-No pain                 Earl Lites P Keyetta Hollingworth

## 2023-03-31 ENCOUNTER — Other Ambulatory Visit: Payer: Self-pay | Admitting: Cardiovascular Disease

## 2023-03-31 DIAGNOSIS — I4819 Other persistent atrial fibrillation: Secondary | ICD-10-CM

## 2023-03-31 NOTE — Telephone Encounter (Signed)
 Prescription refill request for Eliquis received. Indication: Afib   Last office visit: 03/08/23 Charlean Merl)  Scr: 0.92 (03/22/23)  Age: 58 Weight: 102.5kg  Appropriate dose. Refill sent.

## 2023-04-12 ENCOUNTER — Ambulatory Visit (HOSPITAL_COMMUNITY)
Admission: RE | Admit: 2023-04-12 | Discharge: 2023-04-12 | Disposition: A | Discharge status: Home / Self Care | Source: Ambulatory Visit | Attending: Physician Assistant | Admitting: Physician Assistant

## 2023-04-12 ENCOUNTER — Encounter (HOSPITAL_COMMUNITY): Payer: Self-pay | Admitting: Physician Assistant

## 2023-04-12 ENCOUNTER — Ambulatory Visit (HOSPITAL_COMMUNITY): Payer: Medicaid Other | Admitting: Physician Assistant

## 2023-04-12 VITALS — BP 132/80 | HR 52 | Ht 62.5 in | Wt 223.4 lb

## 2023-04-12 DIAGNOSIS — Z79899 Other long term (current) drug therapy: Secondary | ICD-10-CM | POA: Insufficient documentation

## 2023-04-12 DIAGNOSIS — Z5181 Encounter for therapeutic drug level monitoring: Secondary | ICD-10-CM | POA: Insufficient documentation

## 2023-04-12 DIAGNOSIS — I4519 Other right bundle-branch block: Secondary | ICD-10-CM | POA: Diagnosis not present

## 2023-04-12 DIAGNOSIS — I5020 Unspecified systolic (congestive) heart failure: Secondary | ICD-10-CM | POA: Insufficient documentation

## 2023-04-12 DIAGNOSIS — I110 Hypertensive heart disease with heart failure: Secondary | ICD-10-CM | POA: Diagnosis not present

## 2023-04-12 DIAGNOSIS — D6869 Other thrombophilia: Secondary | ICD-10-CM | POA: Insufficient documentation

## 2023-04-12 DIAGNOSIS — R001 Bradycardia, unspecified: Secondary | ICD-10-CM | POA: Insufficient documentation

## 2023-04-12 DIAGNOSIS — I480 Paroxysmal atrial fibrillation: Secondary | ICD-10-CM | POA: Insufficient documentation

## 2023-04-12 DIAGNOSIS — Z7901 Long term (current) use of anticoagulants: Secondary | ICD-10-CM | POA: Insufficient documentation

## 2023-04-12 DIAGNOSIS — I4819 Other persistent atrial fibrillation: Secondary | ICD-10-CM

## 2023-04-12 DIAGNOSIS — I4892 Unspecified atrial flutter: Secondary | ICD-10-CM | POA: Diagnosis not present

## 2023-04-12 MED ORDER — METOPROLOL SUCCINATE ER 50 MG PO TB24: 25.0000 mg | ORAL_TABLET | Freq: Every day | ORAL | Status: DC

## 2023-04-12 NOTE — Progress Notes (Signed)
 Primary Care Physician: Dot Been, FNP Primary Cardiologist: Reatha Harps, MD Electrophysiologist: None  Referring Physician: ED   Melissa Riley is a 58 y.o. female with a history of severe MR s/p repair 01/2022, HFrEF, HTN, atrial flutter, atrial fibrillation who presents for follow up in the [facility].  The patient was initially diagnosed with atrial fibrillation postoperatively following her mitral valve repair. She was initially on amiodarone but this was discontinued. She wore a cardiac monitor which showed 2% afib burden. Patient is on Eliquis for a CHADS2VASC score of 3. Patient presented to the ED 11/30/22 with tachypalpitations and was found to be in atrial flutter with 2:1 block. She underwent DCCV at that time.  Patient was back in afib at her follow up on 03/08/23. She was started on flecainide and had repeat DCCV on 03/29/23.   Patient returns for follow up for atrial fibrillation and flecainide monitoring. She is in SR today and feeling more energetic. No bleeding issues on anticoagulation.   Today, he denies symptoms of palpitations, chest pain, shortness of breath, orthopnea, PND, lower extremity edema, dizziness, presyncope, syncope, bleeding, or neurologic sequela. The patient is tolerating medications without difficulties and is otherwise without complaint today.    Atrial Fibrillation Risk Factors:  she does have symptoms or diagnosis of sleep apnea. she does not have a history of rheumatic fever. she does not have a history of alcohol use. The patient does have a history of early familial atrial fibrillation or other arrhythmias. Mother has afib.  Atrial Fibrillation Management history:  Previous antiarrhythmic drugs: amiodarone, flecainide  Previous cardioversions: 11/30/22, 03/29/23 Previous ablations: none Anticoagulation history: Eliquis  ROS- All systems are reviewed and negative except as per the HPI above.  Past Medical  History:  Diagnosis Date   Arthritis    Atrial fibrillation (HCC)    Heart murmur    Hypertension    Hypothyroidism    Mitral regurgitation    Uterine fibroid     Current Outpatient Medications  Medication Sig Dispense Refill   apixaban (ELIQUIS) 5 MG TABS tablet Take 1 tablet by mouth twice daily 180 tablet 1   Ascorbic Acid (VITAMIN C PO) Take 1 tablet by mouth daily.     aspirin EC 81 MG tablet Take 81 mg by mouth daily. Swallow whole.     Cholecalciferol (VITAMIN D3) 25 MCG (1000 UT) CHEW Chew 2 tablets by mouth daily.     dapagliflozin propanediol (FARXIGA) 10 MG TABS tablet Take 1 tablet (10 mg total) by mouth daily before breakfast. 30 tablet 10   ferrous sulfate 324 MG TBEC Take 324 mg by mouth 2 (two) times daily.     flecainide (TAMBOCOR) 100 MG tablet Take 1 tablet (100 mg total) by mouth 2 (two) times daily. 60 tablet 6   levothyroxine (SYNTHROID) 25 MCG tablet Take 25 mcg by mouth daily before breakfast.     metoprolol succinate (TOPROL XL) 50 MG 24 hr tablet Take 1 tablet (50 mg total) by mouth daily. 30 tablet 5   Multiple Vitamins-Minerals (MULTIVITAMIN WOMEN PO) Take 1 tablet by mouth daily.     sacubitril-valsartan (ENTRESTO) 24-26 MG Take 1 tablet by mouth twice daily 180 tablet 2   spironolactone (ALDACTONE) 25 MG tablet Take 1 tablet (25 mg total) by mouth daily. 90 tablet 3   tacrolimus (PROTOPIC) 0.1 % ointment Apply topically 2 (two) times daily. 100 g 1   No current facility-administered medications for this  encounter.    Physical Exam: BP 132/80   Pulse (!) 52   Ht 5' 2.5" (1.588 m)   Wt 101.3 kg   LMP  (LMP Unknown)   BMI 40.21 kg/m   GEN: Well nourished, well developed in no acute distress CARDIAC: Regular rate and rhythm, no murmurs, rubs, gallops RESPIRATORY:  Clear to auscultation without rales, wheezing or rhonchi  ABDOMEN: Soft, non-tender, non-distended EXTREMITIES:  No edema; No deformity    Wt Readings from Last 3 Encounters:   04/12/23 101.3 kg  03/29/23 102.5 kg  03/08/23 102.9 kg     EKG today demonstrates  SB, NST Vent. rate 52 BPM PR interval 170 ms QRS duration 100 ms QT/QTcB 458/425 ms   Echo 10/24/22 demonstrated   1. Left ventricular ejection fraction, by estimation, is 55%. The left  ventricle has normal function. The left ventricle has no regional wall  motion abnormalities. Left ventricular diastolic parameters are consistent  with Grade I diastolic dysfunction (impaired relaxation).   2. Right ventricular systolic function is mildly reduced. The right  ventricular size is normal. There is normal pulmonary artery systolic  pressure. The estimated right ventricular systolic pressure is 22.2 mmHg.   3. Left atrial size was mildly dilated.   4. Right atrial size was mildly dilated.   5. Status post mitral valve repair. Probably moderate mitral valve  regurgitation, the mitral regurgitation was not fully visualized. Mild  mitral stenosis. The mean mitral valve gradient is 3.0 mmHg.   6. The aortic valve is tricuspid. Aortic valve regurgitation is not  visualized. No aortic stenosis is present.   7. The inferior vena cava is normal in size with greater than 50%  respiratory variability, suggesting right atrial pressure of 3 mmHg.    CHA2DS2-VASc Score = 3  The patient's score is based upon: CHF History: 1 HTN History: 1 Diabetes History: 0 Stroke History: 0 Vascular Disease History: 0 Age Score: 0 Gender Score: 1       ASSESSMENT AND PLAN: Paroxysmal Atrial Fibrillation/atrial flutter The patient's CHA2DS2-VASc score is 3, indicating a 3.2% annual risk of stroke.   S/p DCCV 03/29/23 Patient appears to be maintaining SR Continue flecainide 100 mg BID Will decrease Toprol to 25 mg daily given bradycardia.  Continue Eliquis 5 mg BID  Secondary Hypercoagulable State (ICD10:  D68.69) The patient is at significant risk for stroke/thromboembolism based upon her CHA2DS2-VASc Score of 3.   Continue Apixaban (Eliquis). No bleeding issues.   High Risk Medication Monitoring (ICD 10: Z79.899) Intervals on ECG acceptable for flecainide monitoring. Will order TST to evaluate for exercise induced arrhythmias or QRS widening with flecainide .  HTN Stable on current regimen  VHD S/p mitral valve repair 01/2022  HFrecEF EF normalized after valve repair GDMT per primary cardiology team Fluid status appears stable today  Suspected OSA  Referred for sleep study at previous visit.     Follow up in the AF clinic in 6 months.    Informed Consent   Shared Decision Making/Informed Consent The risks [chest pain, shortness of breath, cardiac arrhythmias, dizziness, blood pressure fluctuations, myocardial infarction, stroke/transient ischemic attack, and life-threatening complications (estimated to be 1 in 10,000)], benefits (risk stratification, diagnosing coronary artery disease, treatment guidance) and alternatives of an exercise tolerance test were discussed in detail with Ms. Lemley and she agrees to proceed.        Jorja Loa PA-C [facility] [address] [place], [place] [address] [phone]

## 2023-05-09 ENCOUNTER — Ambulatory Visit (INDEPENDENT_AMBULATORY_CARE_PROVIDER_SITE_OTHER): Admitting: Dermatology

## 2023-05-09 ENCOUNTER — Encounter: Payer: Self-pay | Admitting: Dermatology

## 2023-05-09 VITALS — BP 158/103

## 2023-05-09 DIAGNOSIS — L910 Hypertrophic scar: Secondary | ICD-10-CM | POA: Diagnosis not present

## 2023-05-09 DIAGNOSIS — L304 Erythema intertrigo: Secondary | ICD-10-CM | POA: Diagnosis not present

## 2023-05-09 MED ORDER — TRIAMCINOLONE ACETONIDE 40 MG/ML IJ SUSP
40.0000 mg | Freq: Once | INTRAMUSCULAR | Status: AC
  Administered 2023-05-09: 40 mg via INTRAMUSCULAR

## 2023-05-09 MED ORDER — NYSTATIN-TRIAMCINOLONE 100000-0.1 UNIT/GM-% EX OINT: 1.0000 | TOPICAL_OINTMENT | Freq: Two times a day (BID) | CUTANEOUS | 0 refills | Status: AC

## 2023-05-09 NOTE — Progress Notes (Signed)
   Follow-Up Visit   Subjective  Melissa Riley is a 58 y.o. female who presents for the following: keloid  Patient present today for follow up visit. Patient was last evaluated on 03/28/23. At this visit patient was prescribed Tacrolimus  to apply topically BID. Patient reports sxs are better. Pt stating that some of the keloids are softer but some are still firm and painful. Patient denies medication changes.  The following portions of the chart were reviewed this encounter and updated as appropriate: medications, allergies, medical history  Review of Systems:  No other skin or systemic complaints except as noted in HPI or Assessment and Plan.  Objective  Well appearing patient in no apparent distress; mood and affect are within normal limits.   A focused examination was performed of the following areas: chest & abdomen   Relevant exam findings are noted in the Assessment and Plan.   Chest - Medial (Center) (10), abdomen (3) Procedure Note Intralesional Injection  Location: mid chest & abdomen  Informed Consent: Discussed risks (infection, pain, bleeding, bruising, thinning of the skin, loss of skin pigment, lack of resolution, and recurrence of lesion) and benefits of the procedure, as well as the alternatives. Informed consent was obtained. Preparation: The area was prepared a standard fashion.  Anesthesia: n/a  Procedure Details: An intralesional injection was performed with Kenalog  40 mg/cc. 0.7 cc in total were injected. NDC #: 1610-9604-54 Exp: 05/2024  Total number of injections: 17  Plan: The patient was instructed on post-op care. Recommend OTC analgesia as needed for pain.   Assessment & Plan   INTERTRIGO Exam: Erythematous macerated patches in body folds  flared  Intertrigo is a chronic recurrent rash that occurs in skin fold areas that may be associated with friction; heat; moisture; yeast; fungus; and bacteria.  It is exacerbated by increased movement /  activity; sweating; and higher atmospheric temperature.  Use of an absorbant powder such as Zeasorb AF powder or other OTC antifungal powder to the area daily can prevent rash recurrence. Other options to help keep the area dry include blow drying the area after bathing or using antiperspirant products such as Duradry sweat minimizing gel.  - Assessment: Patient reports skin irritation in an intertriginous area, likely exacerbated by sweating. Consistent with intertrigo, a condition caused by friction and moisture in skin folds.  - Plan:    Prescribe nystatin -triamcinolone  ointment     - Apply twice daily for up to 10 days     - Discontinue after 10 days to prevent skin thinning    Educate patient on proper application and the importance of keeping the area dry  KELOID (13) Chest - Medial (Center) (10), abdomen (3) Related Medications tacrolimus  (PROTOPIC ) 0.1 % ointment Apply topically 2 (two) times daily. triamcinolone  acetonide (KENALOG -40) injection 40 mg  ERYTHEMA INTERTRIGO   Related Medications nystatin -triamcinolone  ointment (MYCOLOG) Apply 1 Application topically 2 (two) times daily. Use for 2 weeks on affected area then take a break for 2 weeks.  Return in about 6 weeks (around 06/20/2023) for keloid.    Documentation: I have reviewed the above documentation for accuracy and completeness, and I agree with the above.   I, Shirron Louanne Roussel, CMA, am acting as scribe for [employer], DO.   Louana Roup, DO

## 2023-05-09 NOTE — Patient Instructions (Addendum)

## 2023-05-15 ENCOUNTER — Other Ambulatory Visit: Payer: Self-pay

## 2023-05-15 MED ORDER — KETOCONAZOLE 2 % EX CREA: 1.0000 | TOPICAL_CREAM | Freq: Two times a day (BID) | CUTANEOUS | 3 refills | Status: AC

## 2023-05-15 MED ORDER — HYDROCORTISONE 2.5 % EX CREA
TOPICAL_CREAM | Freq: Two times a day (BID) | CUTANEOUS | 3 refills | Status: AC
Start: 2023-05-15 — End: ?

## 2023-05-15 NOTE — Progress Notes (Signed)
 Nystatin /TMC was denied by insurrance. Per Dr Myrtie Atkinson, Hydrocortisone  2.5% cream and ketoconazole  2% cream - Mix small amount of each together and apply twice daily for 1 week then take a break.

## 2023-05-20 ENCOUNTER — Other Ambulatory Visit (HOSPITAL_COMMUNITY): Payer: Self-pay | Admitting: Physician Assistant

## 2023-06-26 ENCOUNTER — Ambulatory Visit (INDEPENDENT_AMBULATORY_CARE_PROVIDER_SITE_OTHER): Admitting: Dermatology

## 2023-06-26 ENCOUNTER — Encounter: Payer: Self-pay | Admitting: Dermatology

## 2023-06-26 VITALS — BP 134/89 | HR 68

## 2023-06-26 DIAGNOSIS — L910 Hypertrophic scar: Secondary | ICD-10-CM | POA: Diagnosis not present

## 2023-06-26 MED ORDER — TRIAMCINOLONE ACETONIDE 40 MG/ML IJ SUSP
40.0000 mg | Freq: Once | INTRAMUSCULAR | Status: AC
  Administered 2023-06-26: 40 mg via INTRAMUSCULAR

## 2023-06-26 NOTE — Patient Instructions (Signed)

## 2023-06-26 NOTE — Progress Notes (Signed)
   Follow-Up Visit   Subjective  Melissa Riley is a 58 y.o. female who presents for the following: Keloid  Patient present today for follow up visit for keloid. Patient was last evaluated on 05/09/23. At this visit patient had 3rd ILK Injections completed. Patient reports sxs are improving but not at goal. Patient denies medication changes.  The following portions of the chart were reviewed this encounter and updated as appropriate: medications, allergies, medical history  Review of Systems:  No other skin or systemic complaints except as noted in HPI or Assessment and Plan.  Objective  Well appearing patient in no apparent distress; mood and affect are within normal limits.  A focused examination was performed of the following areas: Medial Chest and Abdomen  Relevant exam findings are noted in the Assessment and Plan.    Chest - Medial (Center) (10), abdomen (3) Firm brown dermal papules  Assessment & Plan   KELOID Exam: Firm pink/brown dermal papules  - Assessment:  Patient presents with keloid scars showing varying degrees of improvement. Upper body areas are progressing well, while lower regions remain problematic. Middle body area experiences more tension due to arm movements, triggering scar reactions. Some areas have flattened, others remain elevated. Mild hyperpigmentation is present, considered part of the natural healing process. Patient reports less pain during injections, indicating softening of the scars.  - Plan:    Continue stronger level injections, focusing on elevated areas    Avoid injecting flattened areas    Educate patient on importance of daily sunscreen use, especially when wearing clothing that doesn't fully cover scars    Continue use of prescribed cream for irritation (noted to be effective)    Monitor hyperpigmentation, expecting improvement as scars calm down KELOID (13) Chest - Medial (Center) (10), abdomen (3) Procedure Note Intralesional  Injection  Location: Medial chest & abdomen  Informed Consent: Discussed risks (infection, pain, bleeding, bruising, thinning of the skin, loss of skin pigment, lack of resolution, and recurrence of lesion) and benefits of the procedure, as well as the alternatives. Informed consent was obtained. Preparation: The area was prepared a standard fashion.  Anesthesia: N/A  Procedure Details: An intralesional injection was performed with Kenalog  40 mg/cc. 0.7 cc in total were injected. NDC #: 1324-4010-27 Exp: [id number] LOT: May 2026  Total number of injections: 17  Plan: The patient was instructed on post-op care. Recommend OTC analgesia as needed for pain. Related Medications tacrolimus  (PROTOPIC ) 0.1 % ointment Apply topically 2 (two) times daily. triamcinolone  acetonide (KENALOG -40) injection 40 mg   Return in about 6 weeks (around 08/07/2023) for Keloid F/U (Ok to OB at Accutane or Wart F/U).  I, Jetta Ager, am acting as Neurosurgeon for [employer], DO.  Documentation: I have reviewed the above documentation for accuracy and completeness, and I agree with the above.  Louana Roup, DO

## 2023-08-10 ENCOUNTER — Ambulatory Visit: Admitting: Dermatology

## 2023-08-10 ENCOUNTER — Encounter: Payer: Self-pay | Admitting: Dermatology

## 2023-08-10 VITALS — BP 118/71 | HR 64

## 2023-08-10 DIAGNOSIS — L910 Hypertrophic scar: Secondary | ICD-10-CM | POA: Diagnosis not present

## 2023-08-10 MED ORDER — TRIAMCINOLONE ACETONIDE 40 MG/ML IJ SUSP
40.0000 mg | Freq: Once | INTRAMUSCULAR | Status: AC
  Administered 2023-08-10: 40 mg via INTRAMUSCULAR

## 2023-08-10 NOTE — Patient Instructions (Signed)

## 2023-08-10 NOTE — Progress Notes (Signed)
   New Patient Visit   Subjective  Melissa Riley is a 58 y.o. female who presents for the following: keloid  Pt here to follow up on a keloid medial chest. She has had ILK injections 4 times the last being 06/26/23.Pt feels she is getting improvement.  The following portions of the chart were reviewed this encounter and updated as appropriate: medications, allergies, medical history  Review of Systems:  No other skin or systemic complaints except as noted in HPI or Assessment and Plan.  Objective  Well appearing patient in no apparent distress; mood and affect are within normal limits. A focused examination was performed of the following areas: chest  Relevant exam findings are noted in the Assessment and Plan.   Chest - Medial [facility]) Procedure Note Intralesional Injection  Location: chest  Informed Consent: Discussed risks (infection, pain, bleeding, bruising, thinning of the skin, loss of skin pigment, lack of resolution, and recurrence of lesion) and benefits of the procedure, as well as the alternatives. Informed consent was obtained. Preparation: The area was prepared a standard fashion.  Anesthesia:N/A  Procedure Details: An intralesional injection was performed with Kenalog  40 mg/cc. 0.2 cc in total were injected. NDC #: [id number] Exp: 06/23/24  Total number of injections: 8  Plan: The patient was instructed on post-op care. Recommend OTC analgesia as needed for pain.   Assessment & Plan     KELOID Chest - Medial [facility]) Related Medications tacrolimus  (PROTOPIC ) 0.1 % ointment Apply topically 2 (two) times daily. triamcinolone  acetonide (KENALOG -40) injection 40 mg   No follow-ups on file.  LILLETTE Darice Smock, CMA, am acting as scribe for [employer], DO.   Documentation: I have reviewed the above documentation for accuracy and completeness, and I agree with the above.  Delon Lenis, DO

## 2023-08-16 ENCOUNTER — Telehealth: Payer: Self-pay | Admitting: Pharmacy Technician

## 2023-08-16 ENCOUNTER — Other Ambulatory Visit (HOSPITAL_COMMUNITY): Payer: Self-pay

## 2023-08-16 NOTE — Telephone Encounter (Signed)
 Pharmacy Patient Advocate Encounter   Received notification from CoverMyMeds that prior authorization for Farxiga  10MG  is required/requested.   Insurance verification completed.   The patient is insured through [facility] [place] .   Per test claim: PA required; PA submitted to above mentioned insurance via CoverMyMeds Key/confirmation #/EOC [id number] Status is pending

## 2023-08-16 NOTE — Telephone Encounter (Signed)
 Pharmacy Patient Advocate Encounter  Received notification from [facility] Medicaid that Prior Authorization for farxiga  10 Mg has been APPROVED from 08/02/23 to 08/15/24. Ran test claim, Copay is $4.00- one month. This test claim was processed through [facility]- copay amounts may vary at other pharmacies due to pharmacy/plan contracts, or as the patient moves through the different stages of their insurance plan.   PA #/Case ID/Reference #: [id number]

## 2023-08-19 ENCOUNTER — Other Ambulatory Visit: Payer: Self-pay | Admitting: Dermatology

## 2023-08-19 DIAGNOSIS — L910 Hypertrophic scar: Secondary | ICD-10-CM

## 2023-08-22 ENCOUNTER — Other Ambulatory Visit: Payer: Self-pay

## 2023-08-22 DIAGNOSIS — L910 Hypertrophic scar: Secondary | ICD-10-CM

## 2023-08-22 MED ORDER — TACROLIMUS 0.1 % EX OINT: TOPICAL_OINTMENT | Freq: Two times a day (BID) | CUTANEOUS | 6 refills | Status: AC

## 2023-08-25 LAB — COLOGUARD: COLOGUARD: NEGATIVE

## 2023-09-13 ENCOUNTER — Encounter: Payer: Self-pay | Admitting: Dermatology

## 2023-09-13 ENCOUNTER — Ambulatory Visit (INDEPENDENT_AMBULATORY_CARE_PROVIDER_SITE_OTHER): Payer: Self-pay | Admitting: Dermatology

## 2023-09-13 VITALS — BP 162/93

## 2023-09-13 DIAGNOSIS — L910 Hypertrophic scar: Secondary | ICD-10-CM

## 2023-09-13 MED ORDER — TRIAMCINOLONE ACETONIDE 40 MG/ML IJ SUSP
40.0000 mg | Freq: Once | INTRAMUSCULAR | Status: AC
  Administered 2023-09-13: 40 mg via INTRAMUSCULAR

## 2023-09-13 NOTE — Progress Notes (Signed)
   Follow-Up Visit   Subjective  Melissa Riley is a 58 y.o. female established patient who presents for FOLLOW UP on the diagnoses listed below:  Patient was last evaluated on 08/10/23.   Keloid: Patient presents for her 6th round of kenalog  injections. Patient reports sxs are better - she has noticed more improvement in appearance and it is softer. Itch/Pain Score 0 out of 10.    Are you nursing, pregnant or trying to conceive? no   The following portions of the chart were reviewed this encounter and updated as appropriate: medications, allergies, medical history  Review of Systems:  No other skin or systemic complaints except as noted in HPI or Assessment and Plan.  Objective  Well appearing patient in no apparent distress; mood and affect are within normal limits.   A focused examination was performed of the following areas: chest   Relevant exam findings are noted in the Assessment and Plan.     Chest - Medial (Center) (8) Procedure Note Intralesional Injection  Location: chest  Informed Consent: Discussed risks (infection, pain, bleeding, bruising, thinning of the skin, loss of skin pigment, lack of resolution, and recurrence of lesion) and benefits of the procedure, as well as the alternatives. Informed consent was obtained. Preparation: The area was prepared a standard fashion.  Anesthesia:N/A  Procedure Details: An intralesional injection was performed with Kenalog  40 mg/cc. 0.2 cc in total were injected. NDC #: [id number] Exp: 06/23/24  Total number of injections: 8  Plan: The patient was instructed on post-op care. Recommend OTC analgesia as needed for pain.   Assessment & Plan   KELOID Exam shows Firm pink/brown dermal papule(s)/plaque(s).  Treatment Plan: - Kenalog  40 injections administered today  KELOID (8) Chest - Medial (Center) (8) Related Medications tacrolimus  (PROTOPIC ) 0.1 % ointment Apply topically 2 (two) times daily. Apply 1.6  grams to the chest 2 times daily triamcinolone  acetonide (KENALOG -40) injection 40 mg   Return in about 12 weeks (around 12/06/2023) for KELOID.   Documentation: I have reviewed the above documentation for accuracy and completeness, and I agree with the above.  I, Shirron Maranda, CMA, am acting as scribe for [employer], DO.   Delon Lenis, DO

## 2023-09-13 NOTE — Patient Instructions (Signed)

## 2023-10-12 ENCOUNTER — Encounter (HOSPITAL_COMMUNITY): Payer: Self-pay | Admitting: Physician Assistant

## 2023-10-12 ENCOUNTER — Ambulatory Visit (HOSPITAL_COMMUNITY)
Admission: RE | Admit: 2023-10-12 | Discharge: 2023-10-12 | Disposition: A | Discharge status: Home / Self Care | Payer: Self-pay | Source: Ambulatory Visit | Attending: Physician Assistant | Admitting: Physician Assistant

## 2023-10-12 VITALS — BP 118/78 | HR 78 | Ht 62.5 in | Wt 232.0 lb

## 2023-10-12 DIAGNOSIS — Z5181 Encounter for therapeutic drug level monitoring: Secondary | ICD-10-CM

## 2023-10-12 DIAGNOSIS — D6869 Other thrombophilia: Secondary | ICD-10-CM

## 2023-10-12 DIAGNOSIS — Z79899 Other long term (current) drug therapy: Secondary | ICD-10-CM

## 2023-10-12 DIAGNOSIS — I480 Paroxysmal atrial fibrillation: Secondary | ICD-10-CM

## 2023-10-12 MED ORDER — FLECAINIDE ACETATE 100 MG PO TABS: 100.0000 mg | ORAL_TABLET | Freq: Two times a day (BID) | ORAL | 6 refills | Status: DC

## 2023-10-12 MED ORDER — METOPROLOL SUCCINATE ER 50 MG PO TB24: 25.0000 mg | ORAL_TABLET | Freq: Every day | ORAL | 11 refills | Status: DC

## 2023-10-12 NOTE — Progress Notes (Signed)
 Primary Care Physician: Yaya, Irene J, FNP Primary Cardiologist: Darryle ONEIDA Decent, MD Electrophysiologist: None  Referring Physician: ED   Melissa Riley is a 58 y.o. female with a history of severe MR s/p repair 01/2022, HFrEF, HTN, atrial flutter, atrial fibrillation who presents for follow up in the [facility].  The patient was initially diagnosed with atrial fibrillation postoperatively following her mitral valve repair. She was initially on amiodarone  but this was discontinued. She wore a cardiac monitor which showed 2% afib burden. Patient is on Eliquis  for a CHADS2VASC score of 3. Patient presented to the ED 11/30/22 with tachypalpitations and was found to be in atrial flutter with 2:1 block. She underwent DCCV at that time.  Patient was back in afib at her follow up on 03/08/23. She was started on flecainide  and had repeat DCCV on 03/29/23.   Patient returns for follow up for atrial fibrillation and flecainide  monitoring. She reports that she has done well since her last visit with no interim symptoms of afib. No bleeding issues on anticoagulation.   Today, she  denies symptoms of palpitations, chest pain, shortness of breath, orthopnea, PND, lower extremity edema, dizziness, presyncope, syncope, bleeding, or neurologic sequela. The patient is tolerating medications without difficulties and is otherwise without complaint today.    Atrial Fibrillation Risk Factors:  she does have symptoms or diagnosis of sleep apnea. she does not have a history of rheumatic fever. she does not have a history of alcohol use. The patient does have a history of early familial atrial fibrillation or other arrhythmias. Mother has afib.  Atrial Fibrillation Management history:  Previous antiarrhythmic drugs: amiodarone , flecainide   Previous cardioversions: 11/30/22, 03/29/23 Previous ablations: none Anticoagulation history: Eliquis   ROS- All systems are reviewed and negative  except as per the HPI above.  Past Medical History:  Diagnosis Date   Arthritis    Atrial fibrillation (HCC)    Heart murmur    Hypertension    Hypothyroidism    Mitral regurgitation    Uterine fibroid     Current Outpatient Medications  Medication Sig Dispense Refill   apixaban  (ELIQUIS ) 5 MG TABS tablet Take 1 tablet by mouth twice daily 180 tablet 1   Ascorbic Acid  (VITAMIN C  PO) Take 1 tablet by mouth daily.     aspirin  EC 81 MG tablet Take 81 mg by mouth daily. Swallow whole.     Cholecalciferol (VITAMIN D3) 25 MCG (1000 UT) CHEW Chew 2 tablets by mouth daily.     dapagliflozin  propanediol (FARXIGA ) 10 MG TABS tablet Take 1 tablet (10 mg total) by mouth daily before breakfast. 30 tablet 10   ferrous sulfate  324 MG TBEC Take 324 mg by mouth 2 (two) times daily.     flecainide  (TAMBOCOR ) 100 MG tablet Take 1 tablet (100 mg total) by mouth 2 (two) times daily. 60 tablet 6   hydrocortisone  2.5 % cream Apply topically 2 (two) times daily. Mix together with Ketoconazole  cream and apply twice daily x 1 week then stop to take a break. 60 g 3   ketoconazole  (NIZORAL ) 2 % cream Apply 1 Application topically 2 (two) times daily. Mix together with Ketoconazole  cream and apply twice daily x 1 week then stop to take a break. 60 g 3   levothyroxine  (SYNTHROID ) 25 MCG tablet Take 25 mcg by mouth daily before breakfast.     metoprolol  succinate (TOPROL -XL) 50 MG 24 hr tablet Take 1 tablet by mouth once daily 30 tablet  11   Multiple Vitamins-Minerals (MULTIVITAMIN WOMEN PO) Take 1 tablet by mouth daily.     nystatin -triamcinolone  ointment (MYCOLOG) Apply 1 Application topically 2 (two) times daily. Use for 2 weeks on affected area then take a break for 2 weeks. 30 g 0   sacubitril -valsartan  (ENTRESTO ) 24-26 MG Take 1 tablet by mouth twice daily 180 tablet 2   spironolactone  (ALDACTONE ) 25 MG tablet Take 1 tablet (25 mg total) by mouth daily. 90 tablet 3   tacrolimus  (PROTOPIC ) 0.1 % ointment Apply  topically 2 (two) times daily. Apply 1.6 grams to the chest 2 times daily 100 g 6   No current facility-administered medications for this encounter.    Physical Exam: BP 118/78   Pulse 78   Ht 5' 2.5 (1.588 m)   Wt 105.2 kg   LMP  (LMP Unknown)   BMI 41.76 kg/m   GEN: Well nourished, well developed in no acute distress CARDIAC: Regular rate and rhythm, no murmurs, rubs, gallops RESPIRATORY:  Clear to auscultation without rales, wheezing or rhonchi  ABDOMEN: Soft, non-tender, non-distended EXTREMITIES:  No edema; No deformity    Wt Readings from Last 3 Encounters:  10/12/23 105.2 kg  04/12/23 101.3 kg  03/29/23 102.5 kg     EKG today demonstrates  SR Vent. rate 78 BPM PR interval 176 ms QRS duration 120 ms QT/QTcB 420/478 ms   Echo 10/24/22 demonstrated   1. Left ventricular ejection fraction, by estimation, is 55%. The left  ventricle has normal function. The left ventricle has no regional wall  motion abnormalities. Left ventricular diastolic parameters are consistent  with Grade I diastolic dysfunction (impaired relaxation).   2. Right ventricular systolic function is mildly reduced. The right  ventricular size is normal. There is normal pulmonary artery systolic  pressure. The estimated right ventricular systolic pressure is 22.2 mmHg.   3. Left atrial size was mildly dilated.   4. Right atrial size was mildly dilated.   5. Status post mitral valve repair. Probably moderate mitral valve  regurgitation, the mitral regurgitation was not fully visualized. Mild  mitral stenosis. The mean mitral valve gradient is 3.0 mmHg.   6. The aortic valve is tricuspid. Aortic valve regurgitation is not  visualized. No aortic stenosis is present.   7. The inferior vena cava is normal in size with greater than 50%  respiratory variability, suggesting right atrial pressure of 3 mmHg.    CHA2DS2-VASc Score = 3  The patient's score is based upon: CHF History: 1 HTN History:  1 Diabetes History: 0 Stroke History: 0 Vascular Disease History: 0 Age Score: 0 Gender Score: 1       ASSESSMENT AND PLAN: Paroxysmal Atrial Fibrillation/atrial flutter (ICD10:  I48.0) The patient's CHA2DS2-VASc score is 3, indicating a 3.2% annual risk of stroke.   Patient appears to be maintaining SR Continue flecainide  100 mg BID Continue Toprol  25 mg daily  Continue Eliquis  5 mg BID  Secondary Hypercoagulable State (ICD10:  D68.69) The patient is at significant risk for stroke/thromboembolism based upon her CHA2DS2-VASc Score of 3.  Continue Apixaban  (Eliquis ). No bleeding issues.   High Risk Medication Monitoring (ICD 10: [id number]) Patient requires ongoing monitoring for anti-arrhythmic medication which has the potential to cause life threatening arrhythmias. Intervals on ECG acceptable for flecainide  monitoring. Previously ordered TST was not scheduled. Will reorder.   HTN Stable on current regimen  VHD S/p mitral valve repair 01/2022  HFrecEF EF normalized after valve repair  GDMT per primary cardiology team Fluid  status appears stable today   Follow up in the AF clinic in 6 months.    Informed Consent   Shared Decision Making/Informed Consent The risks [chest pain, shortness of breath, cardiac arrhythmias, dizziness, blood pressure fluctuations, myocardial infarction, stroke/transient ischemic attack, and life-threatening complications (estimated to be 1 in 10,000)], benefits (risk stratification, diagnosing coronary artery disease, treatment guidance) and alternatives of an exercise tolerance test were discussed in detail with Ms. Gin and she agrees to proceed.     Daril Kicks PA-C Afib [facility] [address] [place], [place] [address] [phone]

## 2023-10-25 ENCOUNTER — Telehealth: Payer: Self-pay | Admitting: Pharmacy Technician

## 2023-10-25 ENCOUNTER — Other Ambulatory Visit: Payer: Self-pay | Admitting: Cardiovascular Disease

## 2023-10-25 ENCOUNTER — Telehealth: Payer: Self-pay | Admitting: Cardiovascular Disease

## 2023-10-25 DIAGNOSIS — I5022 Chronic systolic (congestive) heart failure: Secondary | ICD-10-CM

## 2023-10-25 DIAGNOSIS — Z9889 Other specified postprocedural states: Secondary | ICD-10-CM

## 2023-10-25 NOTE — Telephone Encounter (Signed)
   Emailed patient application -she also needs eliquis  and entresto   She also wanted them mailed, will mail fri

## 2023-10-25 NOTE — Telephone Encounter (Signed)
 I emailed the patient the application and I will mail her one. Also sent her application for eliquis  and entresto 

## 2023-10-25 NOTE — Telephone Encounter (Signed)
 Patient calling the office for samples of medication:   1.  What medication and dosage are you requesting samples for?   dapagliflozin  propanediol (FARXIGA ) 10 MG TABS tablet    2.  Are you currently out of this medication?  Pt only has about a week of medication left. She recently lost her insurance and is not covered, Rx is $500. Pt cannot afford and wants to know if samples can be provided to hold her over. Please advise.

## 2023-10-25 NOTE — Telephone Encounter (Signed)
 Emailed patient application -she also needs farxiga  and entresto   She also wanted them mailed, will mail fri

## 2023-10-25 NOTE — Telephone Encounter (Signed)
 Emailed patient application -she also needs farxiga  and eliquis   She also wanted them mailed, will mail fri

## 2023-10-27 NOTE — Telephone Encounter (Signed)
 The patient does not have insurance. Entresto  was removed from [employer] assistance as of 10/25/23 and they will no longer be accepting any new applicants. She doesn't qualify for a grant since she does not have insurance. Sending to you guys to see if she can be prescribed something to replace the entresto      We are working on her farxiga  and eliquis  applications for assistance

## 2023-10-27 NOTE — Telephone Encounter (Signed)
 Patient states she has enough Entresto  for right now and plans to continue taking until she runs out.   Shared Dr. Rosslyn recommendation to stop Entresto  and start Losartan 25 mg daily, patient did not want to do this until she finished her Entresto .  Patient states she was told an application for Farixga and Eliquis  will be mailed to her today and would receive a callback about this and samples of Farxiga .

## 2023-10-27 NOTE — Telephone Encounter (Signed)
Mailed application.

## 2023-10-31 ENCOUNTER — Encounter (HOSPITAL_COMMUNITY): Payer: Self-pay

## 2023-11-03 ENCOUNTER — Encounter (HOSPITAL_COMMUNITY): Payer: Self-pay | Admitting: *Deleted

## 2023-11-06 NOTE — Telephone Encounter (Signed)
 Hi, the patient called me and asked if there are any samples for farxiga . She is working on the application

## 2023-11-08 ENCOUNTER — Encounter (HOSPITAL_COMMUNITY): Payer: Self-pay

## 2023-11-10 NOTE — Telephone Encounter (Signed)
 Pt said she still hasn't filled out the application but will

## 2023-11-10 NOTE — Telephone Encounter (Signed)
 Hi, the patient has been out of farxiga  for about a week. She is asking for samples     She said she still hasn't filled out the application yet for farxiga  but she said she will.

## 2023-11-14 ENCOUNTER — Telehealth: Payer: Self-pay | Admitting: Licensed Clinical Social Worker

## 2023-11-14 MED ORDER — DAPAGLIFLOZIN PROPANEDIOL 10 MG PO TABS: 10.0000 mg | ORAL_TABLET | Freq: Every day | ORAL | 0 refills | Status: DC

## 2023-11-14 NOTE — Telephone Encounter (Signed)
 Patient will need to bring in application in order to receive samples. Since patient uninsured, will route to LCSW as well.

## 2023-11-14 NOTE — Telephone Encounter (Signed)
 H&V Care Navigation CSW Progress Note  Clinical Social Worker contacted patient by phone to f/u on self pay status. Not Medicaid eligible- did complete Eliquis  app and mailed it to clinic yesterday.  Patient is participating in a Managed Medicaid Plan:  No, self pay only  SDOH Screenings   Food Insecurity: Not at Risk (08/09/2023)   Received from [facility]  Housing: Low Risk  (02/17/2022)  Transportation Needs: Not at Risk (08/09/2023)   Received from [facility]  Utilities: Not At Risk (02/17/2022)  Depression (PHQ2-9): Low Risk  (06/01/2022)  Financial Resource Strain: Not at Risk (08/09/2023)   Received from [facility]  Physical Activity: Not on File (05/13/2021)   Received from [facility]  Social Connections: Not on File (10/03/2022)   Received from [facility]  Stress: Not on File (05/13/2021)   Received from [facility]  Tobacco Use: Low Risk  (10/12/2023)    Melissa Riley, MSW, LCSW Clinical Social Worker II [facility] Heart/Vascular Care Navigation  [phone]- work cell phone (preferred)

## 2023-11-14 NOTE — Telephone Encounter (Signed)
 H&V Care Navigation CSW Progress Note  Clinical Social Worker contacted patient by phone to f/u on self pay status. Not Medicaid eligible- did complete Farxiga  app and mailed it to clinic yesterday.  Patient is participating in a Managed Medicaid Plan:  No, self pay only  SDOH Screenings   Food Insecurity: Not at Risk (08/09/2023)   Received from [facility]  Housing: Low Risk  (02/17/2022)  Transportation Needs: Not at Risk (08/09/2023)   Received from [facility]  Utilities: Not At Risk (02/17/2022)  Depression (PHQ2-9): Low Risk  (06/01/2022)  Financial Resource Strain: Not at Risk (08/09/2023)   Received from [facility]  Physical Activity: Not on File (05/13/2021)   Received from [facility]  Social Connections: Not on File (10/03/2022)   Received from [facility]  Stress: Not on File (05/13/2021)   Received from [facility]  Tobacco Use: Low Risk  (10/12/2023)    Melissa Riley, MSW, LCSW Clinical Social Worker II [facility] Heart/Vascular Care Navigation  [phone]- work cell phone (preferred)

## 2023-11-14 NOTE — Addendum Note (Signed)
 Addended by: DARRELL BRUCKNER on: 11/14/2023 03:44 PM   Modules accepted: Orders

## 2023-11-14 NOTE — Progress Notes (Signed)
 Heart and Vascular Care Navigation  11/14/2023  Melissa Riley Oct 14, 1965 [id number]  Reason for Referral: self pay only, medication access Patient is participating in a Managed Medicaid Plan: No, denied as over income  Engaged with patient by telephone for initial visit for Heart and Vascular Care Coordination.                                                                                                   Assessment:                     LCSW was able to reach pt today at [phone]. Introduced self, role, reason for call. Confirmed name, DOB and home address. Pt currently employed, shares her job gave a $1 raise and she has been working more hours than usual so unfortunately when it came to re-certifying her Medicaid she was now over income. Pt has access to housing, food and transportation. She is concerned about costs of medications without coverage- confirmed she did mail applications for Farxiga  and Eliquis  in to clinic.   We discussed [facility] Financial Assistance program given upcoming testing ordered by [facility]. As well as [employer] to help with costs of generic medications as well as using [facility] pharmacy vs [facility] for medications.   Pt appreciative of call, will mail these to her home address and f/u. Encouraged pt to call me as needed.                   HRT/VAS Care Coordination     Patients Home Cardiology Office [facility]   Outpatient Care Team Social Worker   Social Worker Name: Marit Lark, [place], [phone]   Living arrangements for the past 2 months Single Family Home   Lives with: Self   Patient Current Insurance Coverage Self-Pay   Patient Has Concern With Paying Medical Bills Yes   Patient Concerns With Medical Bills self pay, over income for Medicaid   Medical Bill Referrals: SELINA Royals   Does Patient Have Prescription Coverage? No   Patient Prescription Assistance Programs Patient Assistance Programs; [place] [employer]   Home Assistive  Devices/Equipment Eyeglasses   DME Agency [employer]       Social History:                                                                             SDOH Screenings   Food Insecurity: No Food Insecurity (11/14/2023)  Housing: Low Risk  (11/14/2023)  Transportation Needs: No Transportation Needs (11/14/2023)  Utilities: Not At Risk (11/14/2023)  Depression (PHQ2-9): Low Risk  (06/01/2022)  Financial Resource Strain: Medium Risk (11/14/2023)  Physical Activity: Not on File (05/13/2021)   Received from [facility]  Social Connections: Not on File (10/03/2022)   Received from [facility]  Stress: Not on File (05/13/2021)  Received from [facility]  Tobacco Use: Low Risk  (10/12/2023)  Health Literacy: Adequate Health Literacy (11/14/2023)    SDOH Interventions: Financial Resources:  Financial Strain Interventions: Other (Comment) (sent information about [facility] Financial Assistance and [employer]; pt has mailed in Farxiga  and Eliquis  applications to clinic and this was passed on to Pharmacy teams) Financial Counseling for [employer] Program  Food Insecurity:  Food Insecurity Interventions: Intervention Not Indicated  Housing Insecurity:  Housing Interventions: Intervention Not Indicated  Transportation:   Transportation Interventions: Intervention Not Indicated     Other Care Navigation Interventions:     Provided Pharmacy assistance resources Patient Assistance Programs, [place] [employer]   Follow-up plan:   LCSW has mailed pt my card, Haematologist and [employer] application. Will f/u to ensure pt has received application and answer any additional questions/concerns that may arise. Pt mailed applications to pt, routed this to the pharmacy team for sample assistance/awareness.

## 2023-11-16 NOTE — Telephone Encounter (Signed)
 Hi, can someone please get the provider to sign the form that is scanned in media under farxiga  pap for this patient and fax back to [phone]? Thank you!

## 2023-11-16 NOTE — Telephone Encounter (Signed)
 Hi, can someone please get the provider to sign the form that is scanned in media under Eliquis  [employer] ([employer]) PROVIDER APPLICATION for this patient and fax back to [phone]? Thank you!

## 2023-11-17 NOTE — Telephone Encounter (Signed)
 Received approval from [employer] for Eliquis  through 11/15/24. Application number is [id number]

## 2023-11-17 NOTE — Telephone Encounter (Signed)
 Eliquis  provider formed completed and uploaded.

## 2023-11-17 NOTE — Telephone Encounter (Signed)
 Faxed eliquis  application to [employer]

## 2023-11-20 ENCOUNTER — Telehealth: Payer: Self-pay | Admitting: Licensed Clinical Social Worker

## 2023-11-20 NOTE — Telephone Encounter (Signed)
 H&V Care Navigation CSW Progress Note  Clinical Social Worker contacted patient by phone to f/u on missed call while out of clinic. Note she has also contact pharmacy assistance team. LCSW contacted pt to let her know I was able to contact [employer] C[employer] number for assistance and noted that pt needs to call them for first shipment. No answer today at [phone], I left a message with [employer] number for her to call for first shipment [phone]). Additional resources had been mailed for [employer] and generic assistance through [facility]. Will re-attempt again as able if no return call.  Patient is participating in a Managed Medicaid Plan:  No, self pay only  SDOH Screenings   Food Insecurity: No Food Insecurity (11/14/2023)  Housing: Low Risk  (11/14/2023)  Transportation Needs: No Transportation Needs (11/14/2023)  Utilities: Not At Risk (11/14/2023)  Depression (PHQ2-9): Low Risk  (06/01/2022)  Financial Resource Strain: Medium Risk (11/14/2023)  Physical Activity: Not on File (05/13/2021)   Received from [facility]  Social Connections: Not on File (10/03/2022)   Received from [facility]  Stress: Not on File (05/13/2021)   Received from [facility]  Tobacco Use: Low Risk  (10/12/2023)  Health Literacy: Adequate Health Literacy (11/14/2023)    Marit Lark, MSW, LCSW Clinical Social Worker II [facility] Heart/Vascular Care Navigation  [phone]- work cell phone (preferred)

## 2023-11-20 NOTE — Telephone Encounter (Signed)
 Hi, the patient called and she said she is out of eliquis . I gave her the number to check on the status of the shipment of the eliquis  from [employer] that she was approved for on 11/16/23. But she is asking for samples until she gets the shipment since she is completely out. Thank you

## 2023-11-22 MED ORDER — APIXABAN 5 MG PO TABS: 5.0000 mg | ORAL_TABLET | Freq: Two times a day (BID) | ORAL | 0 refills | Status: AC

## 2023-11-22 NOTE — Telephone Encounter (Signed)
 Provider portion of Farxiga  application printed and signed. Faxed back to number provided.

## 2023-11-22 NOTE — Telephone Encounter (Signed)
 Faxed application to [employer] for farxiga 

## 2023-11-22 NOTE — Telephone Encounter (Signed)
 Samples ready for pickup per chris. I called the patient and left her a message

## 2023-11-22 NOTE — Telephone Encounter (Signed)
 Patient has submitted proper documentation and has been approved. Will dispense  2 weeks of samples until her assistance arrives

## 2023-11-22 NOTE — Telephone Encounter (Signed)
 Hi, the patient is about out of the samples that we gave her to hold her while the application is being processed. I was sending back to the triage que to see if someone can please get the provider to sign the farxiga  pap application that is scanned in media and fax back to [phone] so we can send this in. Thank you

## 2023-11-22 NOTE — Addendum Note (Signed)
 Addended by: DARRELL BRUCKNER on: 11/22/2023 01:41 PM   Modules accepted: Orders

## 2023-11-23 NOTE — Telephone Encounter (Signed)
 PAP: Patient assistance application for Farxiga  has been approved by PAP Companies: [employer] from 11/23/23 to 11/22/24. Medication should be delivered to PAP Delivery: Home. For further shipping updates, please contact [employer] ([employer]) at [phone]. Patient ID is: [id number]

## 2023-11-24 ENCOUNTER — Ambulatory Visit (HOSPITAL_COMMUNITY)
Admission: RE | Admit: 2023-11-24 | Discharge: 2023-11-24 | Disposition: A | Discharge status: Home / Self Care | Payer: Self-pay | Source: Ambulatory Visit | Attending: Physician Assistant | Admitting: Physician Assistant

## 2023-11-24 ENCOUNTER — Ambulatory Visit (HOSPITAL_COMMUNITY): Payer: Self-pay | Admitting: Physician Assistant

## 2023-11-24 DIAGNOSIS — I480 Paroxysmal atrial fibrillation: Secondary | ICD-10-CM | POA: Insufficient documentation

## 2023-11-24 LAB — EXERCISE TOLERANCE TEST
Angina Index: 0
Duke Treadmill Score: 3
Estimated workload: 4.6
Exercise duration (min): 3 min
Exercise duration (sec): 0 s
MPHR: 164 {beats}/min
Peak HR: 129 {beats}/min
Percent HR: 78 %
Rest HR: 77 {beats}/min
ST Depression (mm): 0 mm

## 2023-12-01 ENCOUNTER — Telehealth: Payer: Self-pay | Admitting: Licensed Clinical Social Worker

## 2023-12-01 NOTE — Telephone Encounter (Signed)
 H&V Care Navigation CSW Progress Note  Clinical Social Worker received a call from pt to share her work helped her complete and submit [employer] application. Clarified the [employer] application must be mailed with all needed documents. Reviewed needed documents and how to obtain letter of nonfiling. Pt shares she may go to [employer] as does not have internet at home. Encouraged her to let us  know if an additional assistance or clarification needed.  Patient is participating in a Managed Medicaid Plan:  no, self pay only  SDOH Screenings   Food Insecurity: No Food Insecurity (11/14/2023)  Housing: Low Risk  (11/14/2023)  Transportation Needs: No Transportation Needs (11/14/2023)  Utilities: Not At Risk (11/14/2023)  Depression (PHQ2-9): Low Risk  (06/01/2022)  Financial Resource Strain: Medium Risk (11/14/2023)  Physical Activity: Not on File (05/13/2021)   Received from [facility]  Social Connections: Not on File (10/03/2022)   Received from [facility]  Stress: Not on File (05/13/2021)   Received from [facility]  Tobacco Use: Low Risk  (10/12/2023)  Health Literacy: Adequate Health Literacy (11/14/2023)    Marit Lark, MSW, LCSW Clinical Social Worker II [facility] Heart/Vascular Care Navigation  [phone]- work cell phone (preferred)

## 2023-12-04 ENCOUNTER — Other Ambulatory Visit: Payer: Self-pay | Admitting: Family Medicine

## 2023-12-04 ENCOUNTER — Ambulatory Visit (INDEPENDENT_AMBULATORY_CARE_PROVIDER_SITE_OTHER): Payer: Self-pay | Admitting: Dermatology

## 2023-12-04 ENCOUNTER — Encounter: Payer: Self-pay | Admitting: Dermatology

## 2023-12-04 VITALS — BP 163/87 | HR 63

## 2023-12-04 DIAGNOSIS — L910 Hypertrophic scar: Secondary | ICD-10-CM

## 2023-12-04 DIAGNOSIS — Z1231 Encounter for screening mammogram for malignant neoplasm of breast: Secondary | ICD-10-CM

## 2023-12-04 MED ORDER — TRIAMCINOLONE ACETONIDE 40 MG/ML IJ SUSP
40.0000 mg | Freq: Once | INTRAMUSCULAR | Status: AC
  Administered 2023-12-04: 40 mg via INTRAMUSCULAR

## 2023-12-04 NOTE — Progress Notes (Unsigned)
 Cardiology Office Note:  .   Date:  12/05/2023  ID:  Melissa Riley, DOB [birth year], MRN [id number] PCP: Earvin Johnston PARAS, FNP  [facility] Providers Cardiologist:  Darryle ONEIDA Decent, MD { History of Present Illness: .    Chief Complaint  Patient presents with   Follow-up    Melissa Riley is a 58 y.o. female with history of MV repair, CHF with recovered EF, pAF, HTN who presents for follow-up.    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  She has a history of severe mitral valve regurgitation and underwent mitral valve repair in January 2024. She underwent mitral valve repair in January 2024 and reports no new updates to her medical history.  She has a history of systolic heart failure with a previously reduced ejection fraction; her most recent ejection fraction is 55%. She is currently on metoprolol  and spironolactone . She has not been regularly checking her blood pressure at home and attributes today's slight elevation to not drinking enough water and not adhering to her routine.  She has paroxysmal atrial fibrillation and is on flecainide . No episodes of atrial fibrillation have occurred since earlier this year after undergoing cardioversion. She monitors her symptoms and has not experienced any heart racing or breathing difficulties. She is also on Eliquis  for anticoagulation.  Regarding her medication regimen, she has been unable to continue Entresto  due to insurance issues and is awaiting assistance for Farxiga . She has been using samples in the interim.  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 No chest pain, trouble breathing, heart racing, or swelling.          Problem List Severe Mitral Regurgitation  -34 mm annuloplasty ring  02/16/2022 2. Systolic HF -EF 40-45% 03/29/2022 -EF 55% 10/24/2022 3. Paroxysmal Afib -repeated episodes after MVR -CHADSVASC=3 (female, HTN, CHF) -2% burden on zio  -no CAD on [facility] 04/05/2021 4. HTN    ROS: All other ROS reviewed and negative. Pertinent positives noted in the HPI.     Studies Reviewed: SABRA       TTE 10/24/2022  1. Left ventricular ejection fraction, by estimation, is 55%. The left  ventricle has normal function. The left ventricle has no regional wall  motion abnormalities. Left ventricular diastolic parameters are consistent  with Grade I diastolic dysfunction  (impaired relaxation).   2. Right ventricular systolic function is mildly reduced. The right  ventricular size is normal. There is normal pulmonary artery systolic  pressure. The estimated right ventricular systolic pressure is 22.2 mmHg.   3. Left atrial size was mildly dilated.   4. Right atrial size was mildly dilated.   5. Status post mitral valve repair. Probably moderate mitral valve  regurgitation, the mitral regurgitation was not fully visualized. Mild  mitral stenosis. The mean mitral valve gradient is 3.0 mmHg.   6. The aortic valve is tricuspid. Aortic valve regurgitation is not  visualized. No aortic stenosis is present.   7. The inferior vena cava is normal in size with greater than 50%  respiratory variability, suggesting right atrial pressure of 3 mmHg.  Physical Exam:   VS:  BP (!) 140/90   Pulse 67   Ht 5' 2 (1.575 m)   Wt 234 lb (106.1 kg)   LMP  (LMP Unknown)  SpO2 98%   BMI 42.80 kg/m    Wt Readings from Last 3 Encounters:  12/05/23 234 lb (106.1 kg)  10/12/23 232 lb (105.2 kg)  04/12/23 223 lb 6.4 oz (101.3 kg)    GEN: Well nourished, well developed in no acute distress NECK: No JVD; No carotid bruits CARDIAC: RRR, no murmurs, rubs, gallops RESPIRATORY:  Clear to auscultation without rales, wheezing or rhonchi  ABDOMEN: Soft, non-tender, non-distended EXTREMITIES:  No edema;  No deformity  ASSESSMENT AND PLAN: .   Assessment and Plan    Status post mitral valve repair for severe mitral regurgitation Status post mitral valve repair in January 2024 with no current issues. - Continue current management and medications. - she knows she needs ABX prior to dental work  Systolic heart failure with recovered ejection fraction Systolic heart failure with previously reduced ejection fraction now improved to 55%. - Transitioned from Entresto  to losartan 50 mg daily due to insurance issues. - Continue Aldactone  25 mg daily. - Continue metoprolol  succinate 50 mg daily. - Farxiga  assistance applied for  - Ordered CBC, CMP, TSH, and lipid panel.  Paroxysmal atrial fibrillation on antiarrhythmic and anticoagulation therapy Paroxysmal atrial fibrillation with no recurrence since cardioversion earlier this year. - Continue flecainide  100 mg BID. QRS stable. Negative ETT.  - Continue Eliquis  5 mg BID. - Discontinued aspirin .  Essential hypertension Blood pressure slightly elevated, possibly due to inadequate hydration. - Monitor blood pressure at home periodically. - Transitioned to losartan 50 mg daily. Aldactone  25 mg daily, metoprolol  succinate 50 mg daily.   Endocarditis prophylaxis for dental procedures Requires antibiotics for dental procedures to prevent endocarditis. - Ensure antibiotics are prescribed for dental procedures.              Follow-up: Return in about 1 year (around 12/04/2024).  Signed, Darryle DASEN. Barbaraann, MD, [facility] [address] [place], [place] [address] [phone]  9:39 AM

## 2023-12-04 NOTE — Progress Notes (Unsigned)
   Follow-Up Visit  Patient (and/or pt guardian) consented to the use of AI-assisted tools for note generation.    Subjective  Melissa Riley is a 58 y.o. 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  Patient has had 7 kenalog  injections  Patient was advised to continue with Tacrolimus  0.1%  Patient reports sxs are better. Patient reports they are smaller and softer but some areas are still a little irritation and itching mostly when sweating  Patient denies medication changes.  The following portions of the chart were reviewed this encounter and updated as appropriate: medications, allergies, medical history  Review of Systems:  No other skin or systemic complaints except as noted in HPI or Assessment and Plan.  Objective  Well appearing patient in no apparent distress; mood and affect are within normal limits.  A focused examination was performed of the following areas: Chest  Relevant exam findings are noted in the Assessment and Plan.   Chest - Medial [facility]), Right Abdomen (side) - Upper An intralesional injection was performed with Kenalog  40 mg/cc. 0.5  cc in total were injected.  Total number of injections: 10 NDC: [address]-8 EXP: 05/2024 LOT: [id number]  Assessment & Plan   KELOID Exam shows Firm pink/brown dermal papule(s)/plaque(s).  Treatment Plan: ILK  Location: Chest and Abdomen  Informed Consent: Discussed risks (infection, pain, bleeding, bruising, thinning of the skin, loss of skin pigment, lack of resolution, and recurrence of lesion) and benefits of the procedure, as well as the alternatives. Informed consent was obtained. Preparation: The area was prepared a standard fashion.   Procedure Details: An intralesional injection was performed with Kenalog  40 mg/cc. 0.5  cc in total were injected.  Total number of injections: 10 NDC: [address]-8 EXP: 05/2024 LOT: [id number]  Plan: The  patient was instructed on post-op care. Recommend OTC analgesia as needed for pain.    KELOID (2) Chest - Medial [facility]), Right Abdomen (side) - Upper Related Medications tacrolimus  (PROTOPIC ) 0.1 % ointment Apply topically 2 (two) times daily. Apply 1.6 grams to the chest 2 times daily triamcinolone  acetonide (KENALOG -40) injection 40 mg   Return in about 6 weeks (around 01/15/2024) for Keloid F/U okay to DB per JD.  I, Lyle Cords, as acting as a neurosurgeon for [employer], DO .   Documentation: I have reviewed the above documentation for accuracy and completeness, and I agree with the above.  Delon Lenis, DO

## 2023-12-04 NOTE — Patient Instructions (Signed)

## 2023-12-05 ENCOUNTER — Ambulatory Visit: Payer: Self-pay | Attending: Cardiovascular Disease | Admitting: Cardiovascular Disease

## 2023-12-05 ENCOUNTER — Encounter: Payer: Self-pay | Admitting: Cardiovascular Disease

## 2023-12-05 ENCOUNTER — Telehealth: Payer: Self-pay | Admitting: Cardiovascular Disease

## 2023-12-05 VITALS — BP 140/90 | HR 67 | Ht 62.0 in | Wt 234.0 lb

## 2023-12-05 DIAGNOSIS — I480 Paroxysmal atrial fibrillation: Secondary | ICD-10-CM

## 2023-12-05 DIAGNOSIS — I5022 Chronic systolic (congestive) heart failure: Secondary | ICD-10-CM

## 2023-12-05 DIAGNOSIS — Z9889 Other specified postprocedural states: Secondary | ICD-10-CM

## 2023-12-05 DIAGNOSIS — I10 Essential (primary) hypertension: Secondary | ICD-10-CM

## 2023-12-05 LAB — CBC

## 2023-12-05 MED ORDER — METOPROLOL SUCCINATE ER 50 MG PO TB24: 25.0000 mg | ORAL_TABLET | Freq: Every day | ORAL | 3 refills | Status: DC

## 2023-12-05 MED ORDER — FLECAINIDE ACETATE 100 MG PO TABS: 100.0000 mg | ORAL_TABLET | Freq: Two times a day (BID) | ORAL | 3 refills | Status: AC

## 2023-12-05 MED ORDER — SPIRONOLACTONE 25 MG PO TABS: 25.0000 mg | ORAL_TABLET | Freq: Every day | ORAL | 3 refills | Status: DC

## 2023-12-05 MED ORDER — LOSARTAN POTASSIUM 50 MG PO TABS: 50.0000 mg | ORAL_TABLET | Freq: Every day | ORAL | 3 refills | Status: DC

## 2023-12-05 NOTE — Telephone Encounter (Signed)
 Spoke with staff at provided number. They were unable to find this pt's profile.

## 2023-12-05 NOTE — Telephone Encounter (Signed)
 Pharmacy called to clarify directions and dose. Please advise

## 2023-12-05 NOTE — Patient Instructions (Signed)
 Medication Instructions:  Stop Entresto  Stop Aspirin   Start Losartan 50 mg once daily *If you need a refill on your cardiac medications before your next appointment, please call your pharmacy*  Lab Work: CBC, Lipids, CMP, TSH today at [facility] If you have labs (blood work) drawn today and your tests are completely normal, you will receive your results only by: MyChart Message (if you have MyChart) OR A paper copy in the mail If you have any lab test that is abnormal or we need to change your treatment, we will call you to review the results.  Testing/Procedures: NONE  Follow-Up: At [facility], you and your health needs are our priority.  As part of our continuing mission to provide you with exceptional heart care, our providers are all part of one team.  This team includes your primary Cardiologist (physician) and Advanced Practice Providers or APPs (Physician Assistants and Nurse Practitioners) who all work together to provide you with the care you need, when you need it.  Your next appointment:   1 year(s)  Provider:   One of our Advanced Practice Providers (APPs): Morse Clause, PA-C  Lamarr Satterfield, NP Miriam Shams, NP  Olivia Pavy, PA-C Josefa Beauvais, NP  Leontine Salen, PA-C Orren Fabry, PA-C  [place], PA-C Ernest Dick, NP  Damien Braver, NP Jon Hails, PA-C  Waddell Donath, PA-C    Dayna Dunn, PA-C  Scott Weaver, PA-C Lum Louis, NP Katlyn West, NP Callie Goodrich, PA-C  Xika Zhao, NP Sheng Haley, PA-C    Kathleen Johnson, PA-C   We recommend signing up for the patient portal called MyChart.  Sign up information is provided on this After Visit Summary.  MyChart is used to connect with patients for Virtual Visits (Telemedicine).  Patients are able to view lab/test results, encounter notes, upcoming appointments, etc.  Non-urgent messages can be sent to your provider as well.   To learn more about what you can do with MyChart, go to  [email or web address].

## 2023-12-06 ENCOUNTER — Ambulatory Visit: Payer: Self-pay | Admitting: Cardiovascular Disease

## 2023-12-06 LAB — COMPREHENSIVE METABOLIC PANEL WITH GFR
ALT: 12 IU/L (ref 0–32)
AST: 18 IU/L (ref 0–40)
Albumin: 4.3 g/dL (ref 3.8–4.9)
Alkaline Phosphatase: 83 IU/L (ref 49–135)
BUN/Creatinine Ratio: 20 (ref 9–23)
BUN: 15 mg/dL (ref 6–24)
Bilirubin Total: 0.4 mg/dL (ref 0.0–1.2)
CO2: 21 mmol/L (ref 20–29)
Calcium: 9.2 mg/dL (ref 8.7–10.2)
Chloride: 102 mmol/L (ref 96–106)
Creatinine, Ser: 0.76 mg/dL (ref 0.57–1.00)
Globulin, Total: 2.3 g/dL (ref 1.5–4.5)
Glucose: 88 mg/dL (ref 70–99)
Potassium: 4.4 mmol/L (ref 3.5–5.2)
Sodium: 138 mmol/L (ref 134–144)
Total Protein: 6.6 g/dL (ref 6.0–8.5)
eGFR: 92 mL/min/1.73 (ref >59)

## 2023-12-06 LAB — CBC
Hematocrit: 43.6 % (ref 34.0–46.6)
Hemoglobin: 14.4 g/dL (ref 11.1–15.9)
MCH: 31.2 pg (ref 26.6–33.0)
MCHC: 33 g/dL (ref 31.5–35.7)
MCV: 94 fL (ref 79–97)
Platelets: 245 x10E3/uL (ref 150–450)
RBC: 4.62 x10E6/uL (ref 3.77–5.28)
RDW: 12.6 % (ref 11.7–15.4)
WBC: 6.5 x10E3/uL (ref 3.4–10.8)

## 2023-12-06 LAB — LIPID PANEL
Chol/HDL Ratio: 2.9 ratio (ref 0.0–4.4)
Cholesterol, Total: 134 mg/dL (ref 100–199)
HDL: 47 mg/dL (ref >39)
LDL Chol Calc (NIH): 69 mg/dL (ref 0–99)
Triglycerides: 96 mg/dL (ref 0–149)
VLDL Cholesterol Cal: 18 mg/dL (ref 5–40)

## 2023-12-06 LAB — TSH: TSH: 1.75 u[IU]/mL (ref 0.450–4.500)

## 2023-12-11 NOTE — Telephone Encounter (Signed)
 Patient called and said she was told by [facility] they did not have the prescription that was sent on 11/22/23. Faxed again

## 2023-12-14 MED ORDER — DAPAGLIFLOZIN PROPANEDIOL 10 MG PO TABS: 10.0000 mg | ORAL_TABLET | Freq: Every day | ORAL | 3 refills | Status: AC

## 2023-12-14 NOTE — Telephone Encounter (Signed)
 Refill sent to [facility], [facility].

## 2023-12-14 NOTE — Addendum Note (Signed)
 Addended by: MEMORY DELON POUR on: 12/14/2023 09:57 AM   Modules accepted: Orders

## 2023-12-14 NOTE — Telephone Encounter (Signed)
 Hi, can the farxiga  prescription please be sent to [facility]- [facility] - [place], [place] - [address] [place] [address] Phone:?[phone]??Fax:?[phone]? Thank you!     I called [facility] to see if they received the prescription on 12/11/23 and they said their fax takes 10-14 days to process the fax and then 10-14 days for the patient to receive shipment. They advised for a prescription to be sent electronically to [facility] instead since they are having issues with receiving our prescription from 11/23/23.

## 2023-12-18 ENCOUNTER — Telehealth: Payer: Self-pay | Admitting: Licensed Clinical Social Worker

## 2023-12-18 NOTE — Telephone Encounter (Signed)
 H&V Care Navigation CSW Progress Note  Clinical Social Worker called [employer] to f/u on application for assistance pt approved for assistance. Pt approved through 10/2024 but they need proof of income for anything not generic. Note the following medications on formulary:  levothyroxine , losartan , metoprolol  succinate and spironolactone . Will reach out to our team about the last three and recommend she reach out to PCP if needing the first. Also will get permission to send information about income to program.   Patient is participating in a Managed Medicaid Plan:  No, self pay only. CAFA pending  SDOH Screenings   Food Insecurity: No Food Insecurity (11/14/2023)  Housing: Low Risk  (11/14/2023)  Transportation Needs: No Transportation Needs (11/14/2023)  Utilities: Not At Risk (11/14/2023)  Depression (PHQ2-9): Low Risk  (06/01/2022)  Financial Resource Strain: Medium Risk (11/14/2023)  Physical Activity: Not on File (05/13/2021)   Received from [facility]  Social Connections: Not on File (10/03/2022)   Received from [facility]  Stress: Not on File (05/13/2021)   Received from [facility]  Tobacco Use: Low Risk  (12/05/2023)  Health Literacy: Adequate Health Literacy (11/14/2023)    Melissa Riley, MSW, LCSW Clinical Social Worker II [facility] Health Heart/Vascular Care Navigation  [phone]- work cell phone (preferred)

## 2023-12-19 MED ORDER — LOSARTAN POTASSIUM 50 MG PO TABS: 50.0000 mg | ORAL_TABLET | Freq: Every day | ORAL | 3 refills | Status: AC

## 2023-12-19 MED ORDER — SPIRONOLACTONE 25 MG PO TABS
25.0000 mg | ORAL_TABLET | Freq: Every day | ORAL | 3 refills | Status: AC
Start: 2023-12-19 — End: ?

## 2023-12-19 MED ORDER — METOPROLOL SUCCINATE ER 50 MG PO TB24
25.0000 mg | ORAL_TABLET | Freq: Every day | ORAL | 3 refills | Status: AC
Start: 2023-12-19 — End: ?

## 2023-12-19 NOTE — Addendum Note (Signed)
 Addended by: MEMORY DELON POUR on: 12/19/2023 07:43 AM   Modules accepted: Orders

## 2023-12-25 ENCOUNTER — Telehealth (HOSPITAL_BASED_OUTPATIENT_CLINIC_OR_DEPARTMENT_OTHER): Payer: Self-pay | Admitting: Licensed Clinical Social Worker

## 2023-12-25 DIAGNOSIS — I5022 Chronic systolic (congestive) heart failure: Secondary | ICD-10-CM

## 2023-12-25 NOTE — Telephone Encounter (Signed)
 H&V Care Navigation CSW Progress Note  Clinical Social Worker received a call from pt to inquire if I received Mychart- confirmed I did, would send documents regarding income and ID to [facility] when at [facility] clinic. Pt shares she still hasn't received her Farxiga . I called [facility] and they state its still processing. Pt script sent by staff 11/20, holiday this past week may have delayed processing- called [facility] as well- waiting for return call.  Also checked CAFA and currently still pending.  Patient is participating in a Managed Medicaid Plan:  No, self pay only  SDOH Screenings   Food Insecurity: No Food Insecurity (11/14/2023)  Housing: Low Risk  (11/14/2023)  Transportation Needs: No Transportation Needs (11/14/2023)  Utilities: Not At Risk (11/14/2023)  Depression (PHQ2-9): Low Risk  (06/01/2022)  Financial Resource Strain: Medium Risk (11/14/2023)  Physical Activity: Not on File (05/13/2021)   Received from [facility]  Social Connections: Not on File (10/03/2022)   Received from [facility]  Stress: Not on File (05/13/2021)   Received from [facility]  Tobacco Use: Low Risk  (12/05/2023)  Health Literacy: Adequate Health Literacy (11/14/2023)    Marit Lark, MSW, LCSW Clinical Social Worker II [facility] Health Heart/Vascular Care Navigation  [phone]- work cell phone (preferred)

## 2023-12-26 MED ORDER — DAPAGLIFLOZIN PROPANEDIOL 10 MG PO TABS: 10.0000 mg | ORAL_TABLET | Freq: Every day | ORAL | 0 refills | Status: AC

## 2023-12-26 NOTE — Addendum Note (Signed)
 Addended by: DARRELL BRUCKNER on: 12/26/2023 03:31 PM   Modules accepted: Orders

## 2023-12-26 NOTE — Telephone Encounter (Signed)
 [facility] still says pending

## 2023-12-27 NOTE — Telephone Encounter (Signed)
   Farxiga  shipment from [facility]

## 2024-01-08 ENCOUNTER — Telehealth: Payer: Self-pay | Admitting: Licensed Clinical Social Worker

## 2024-01-08 ENCOUNTER — Inpatient Hospital Stay
Admission: RE | Admit: 2024-01-08 | Discharge: 2024-01-08 | Payer: Self-pay | Attending: Family Medicine | Admitting: Family Medicine

## 2024-01-08 DIAGNOSIS — Z1231 Encounter for screening mammogram for malignant neoplasm of breast: Secondary | ICD-10-CM

## 2024-01-08 NOTE — Telephone Encounter (Signed)
 H&V Care Navigation CSW Progress Note  Clinical Social Worker contacted patient by phone to f/u on missing forms for CAFA. No answer today at [phone]. Left voicemail. Will re-attempt again as able.  Patient is participating in a Managed Medicaid Plan:  No, self pay only  SDOH Screenings   Food Insecurity: No Food Insecurity (11/14/2023)  Housing: Low Risk (11/14/2023)  Transportation Needs: No Transportation Needs (11/14/2023)  Utilities: Not At Risk (11/14/2023)  Depression (PHQ2-9): Low Risk (06/01/2022)  Financial Resource Strain: Medium Risk (11/14/2023)  Physical Activity: Not on File (05/13/2021)   Received from [facility]  Social Connections: Not on File (10/03/2022)   Received from [facility]  Stress: Not on File (05/13/2021)   Received from [facility]  Tobacco Use: Low Risk (12/05/2023)  Health Literacy: Adequate Health Literacy (11/14/2023)    Marit Lark, MSW, LCSW Clinical Social Worker II [facility] Health Heart/Vascular Care Navigation  [phone]- work cell phone (preferred)

## 2024-01-09 ENCOUNTER — Telehealth: Payer: Self-pay | Admitting: Licensed Clinical Social Worker

## 2024-01-09 NOTE — Telephone Encounter (Signed)
 H&V Care Navigation CSW Progress Note  Clinical Social Worker received a call from pt regarding needed documents. She is at bank getting those now and has called IRS to get letter of nonfiling from 2024.   She was able to get bank statements to me today and those were sent securely to [employer]. Due to delay from IRS deadline has been extended  to Monday, 01/29/24. Will assist pt with submitting paperwork once received.  Patient is participating in a Managed Medicaid Plan:  No, self pay only  SDOH Screenings   Food Insecurity: No Food Insecurity (11/14/2023)  Housing: Low Risk (11/14/2023)  Transportation Needs: No Transportation Needs (11/14/2023)  Utilities: Not At Risk (11/14/2023)  Depression (PHQ2-9): Low Risk (06/01/2022)  Financial Resource Strain: Medium Risk (11/14/2023)  Physical Activity: Not on File (05/13/2021)   Received from [facility]  Social Connections: Not on File (10/03/2022)   Received from [facility]  Stress: Not on File (05/13/2021)   Received from [facility]  Tobacco Use: Low Risk (12/05/2023)  Health Literacy: Adequate Health Literacy (11/14/2023)     Melissa Riley, MSW, LCSW Clinical Social Worker II [facility] Health Heart/Vascular Care Navigation  [phone]- work cell phone (preferred)

## 2024-01-09 NOTE — Telephone Encounter (Signed)
 Matter addressed, rx sent in 11/20

## 2024-01-11 ENCOUNTER — Ambulatory Visit: Payer: Self-pay | Admitting: Dermatology

## 2024-01-17 ENCOUNTER — Encounter: Payer: Self-pay | Admitting: Physician Assistant

## 2024-01-17 ENCOUNTER — Ambulatory Visit: Payer: Self-pay | Admitting: Physician Assistant

## 2024-01-17 VITALS — BP 126/86

## 2024-01-17 DIAGNOSIS — L910 Hypertrophic scar: Secondary | ICD-10-CM

## 2024-01-17 MED ORDER — TRIAMCINOLONE ACETONIDE 40 MG/ML IJ SUSP
40.0000 mg | Freq: Once | INTRAMUSCULAR | Status: AC
  Administered 2024-01-17: 40 mg via INTRADERMAL

## 2024-01-17 NOTE — Patient Instructions (Signed)

## 2024-01-17 NOTE — Progress Notes (Signed)
" ° °  Follow-Up Visit   Subjective  Melissa Riley is a 58 y.o. female ESTABLISHED PATIENT who presents for the following: Keloid follow up   The following portions of the chart were reviewed this encounter and updated as appropriate: medications, allergies, medical history  Review of Systems:  No other skin or systemic complaints except as noted in HPI or Assessment and Plan.  Objective  Well appearing patient in no apparent distress; mood and affect are within normal limits.   A focused examination was performed of the following areas: Chest  Relevant exam findings are noted in the Assessment and Plan.  Mid chest Firm pink/brown dermal papule(s)/plaque(s).    Assessment & Plan  KELOID Mid chest - Intralesional injection - Mid chest Location: Mid chest  Informed Consent: Discussed risks (infection, pain, bleeding, bruising, thinning of the skin, loss of skin pigment, lack of resolution, and recurrence of lesion) and benefits of the procedure, as well as the alternatives. Informed consent was obtained. Preparation: The area was prepared a standard fashion.  Anesthesia: None  Procedure Details: An intralesional injection was performed with Kenalog  40 mg/cc. 0.7 cc in total were injected.  Total number of injections: 6  Plan: The patient was instructed on post-op care. Recommend OTC analgesia as needed for pain.   This Visit - triamcinolone  acetonide (KENALOG -40) injection 40 mg Existing Treatments - tacrolimus  (PROTOPIC ) 0.1 % ointment - Apply topically 2 (two) times daily. Apply 1.6 grams to the chest 2 times daily  Return in about 6 weeks (around 02/28/2024) for Keloid Follow up with Dr Alm.  I, [name], CMA, am acting as scribe for Yitzel Shasteen K, PA-C .   Documentation: I have reviewed the above documentation for accuracy and completeness, and I agree with the above.  [name], PA-C    "

## 2024-01-21 ENCOUNTER — Encounter: Payer: Self-pay | Admitting: Physician Assistant

## 2024-01-22 ENCOUNTER — Telehealth: Payer: Self-pay | Admitting: Licensed Clinical Social Worker

## 2024-01-22 NOTE — Telephone Encounter (Signed)
 H&V Care Navigation CSW Progress Note  Clinical Social Worker contacted patient by phone to f/u on letter of nonfiling. No answer at [phone], left voicemail. Pt returned call shared she has been with her mother most of the holidays will have someone check mailbox as it should have arrived by now. If doesn't arrive by tomorrow I will share Jenna, LCSW's contact information to receive paperwork. Due 1/5. No additional questions at this time.  Patient is participating in a Managed Medicaid Plan:  No, self pay only  SDOH Screenings   Food Insecurity: No Food Insecurity (11/14/2023)  Housing: Low Risk (11/14/2023)  Transportation Needs: No Transportation Needs (11/14/2023)  Utilities: Not At Risk (11/14/2023)  Depression (PHQ2-9): Low Risk (06/01/2022)  Financial Resource Strain: Medium Risk (11/14/2023)  Physical Activity: Not on File (05/13/2021)   Received from [facility]  Social Connections: Not on File (10/03/2022)   Received from [facility]  Stress: Not on File (05/13/2021)   Received from [facility]  Tobacco Use: Low Risk (01/21/2024)  Health Literacy: Adequate Health Literacy (11/14/2023)    Marit Lark, MSW, LCSW Clinical Social Worker II [facility] Health Heart/Vascular Care Navigation  [phone]- work cell phone (preferred)

## 2024-02-09 ENCOUNTER — Telehealth: Payer: Self-pay | Admitting: Licensed Clinical Social Worker

## 2024-02-09 NOTE — Telephone Encounter (Signed)
 H&V Care Navigation CSW Progress Note  Clinical Social Worker received a call from pt to inquire about PCP assistance. Cancelled her appt with [facility] team as they dont accept CAFA. Shared she may be eligible for [facility] which would help lower the $100 self pay copay. I will send her a copy of the application and her letter of nonfiling, ID scan and bank statements provided so she doesn't have to make additional copies.  Patient is participating in a Managed Medicaid Plan:  No, CAFA 100%, self pay  SDOH Screenings   Food Insecurity: No Food Insecurity (11/14/2023)  Housing: Low Risk (11/14/2023)  Transportation Needs: No Transportation Needs (11/14/2023)  Utilities: Not At Risk (11/14/2023)  Depression (PHQ2-9): Low Risk (06/01/2022)  Financial Resource Strain: Medium Risk (11/14/2023)  Physical Activity: Not on File (05/13/2021)   Received from [facility]  Social Connections: Not on File (10/03/2022)   Received from [facility]  Stress: Not on File (05/13/2021)   Received from [facility]  Tobacco Use: Low Risk (01/21/2024)  Health Literacy: Adequate Health Literacy (11/14/2023)    Marit Lark, MSW, LCSW Clinical Social Worker II [facility] Health Heart/Vascular Care Navigation  [phone]- work cell phone (preferred)

## 2024-02-29 ENCOUNTER — Ambulatory Visit: Payer: Self-pay | Admitting: Dermatology

## 2024-03-06 ENCOUNTER — Ambulatory Visit: Admitting: Dermatology

## 2024-04-10 ENCOUNTER — Ambulatory Visit (HOSPITAL_COMMUNITY): Payer: Self-pay | Admitting: Physician Assistant
